# Patient Record
Sex: Female | Born: 1940 | Race: Black or African American | Hispanic: No | Marital: Married | Smoking: Never smoker
Health system: Southern US, Community
[De-identification: ages and names within clinical notes are randomized; demographics above are authoritative.]

## PROBLEM LIST (undated history)

## (undated) DIAGNOSIS — C773 Secondary and unspecified malignant neoplasm of axilla and upper limb lymph nodes: Secondary | ICD-10-CM

## (undated) DIAGNOSIS — C884 Extranodal marginal zone b-cell lymphoma of mucosa-associated lymphoid tissue (malt-lymphoma) not having achieved remission: Secondary | ICD-10-CM

## (undated) DIAGNOSIS — E785 Hyperlipidemia, unspecified: Secondary | ICD-10-CM

## (undated) DIAGNOSIS — C50919 Malignant neoplasm of unspecified site of unspecified female breast: Secondary | ICD-10-CM

## (undated) DIAGNOSIS — D649 Anemia, unspecified: Secondary | ICD-10-CM

## (undated) DIAGNOSIS — Z9221 Personal history of antineoplastic chemotherapy: Secondary | ICD-10-CM

## (undated) DIAGNOSIS — M810 Age-related osteoporosis without current pathological fracture: Secondary | ICD-10-CM

## (undated) DIAGNOSIS — I10 Essential (primary) hypertension: Secondary | ICD-10-CM

## (undated) DIAGNOSIS — R011 Cardiac murmur, unspecified: Secondary | ICD-10-CM

## (undated) DIAGNOSIS — I890 Lymphedema, not elsewhere classified: Secondary | ICD-10-CM

## (undated) DIAGNOSIS — E8989 Other postprocedural endocrine and metabolic complications and disorders: Secondary | ICD-10-CM

## (undated) HISTORY — DX: Cardiac murmur, unspecified: R01.1

## (undated) HISTORY — DX: Extranodal marginal zone b-cell lymphoma of mucosa-associated lymphoid tissue (malt-lymphoma) not having achieved remission: C88.40

## (undated) HISTORY — PX: APPENDECTOMY: SHX54

## (undated) HISTORY — DX: Hyperlipidemia, unspecified: E78.5

## (undated) HISTORY — DX: Lymphedema, not elsewhere classified: I89.0

## (undated) HISTORY — DX: Malignant neoplasm of unspecified site of unspecified female breast: C50.919

## (undated) HISTORY — PX: ABDOMINAL HYSTERECTOMY: SHX81

## (undated) HISTORY — DX: Secondary and unspecified malignant neoplasm of axilla and upper limb lymph nodes: C77.3

## (undated) HISTORY — DX: Other postprocedural endocrine and metabolic complications and disorders: E89.89

## (undated) HISTORY — DX: Extranodal marginal zone B-cell lymphoma of mucosa-associated lymphoid tissue (MALT-lymphoma): C88.4

## (undated) HISTORY — DX: Essential (primary) hypertension: I10

## (undated) HISTORY — DX: Age-related osteoporosis without current pathological fracture: M81.0

---

## 2009-05-22 ENCOUNTER — Emergency Department: Payer: Self-pay | Admitting: Unknown Physician Specialty

## 2011-08-03 ENCOUNTER — Ambulatory Visit: Payer: Self-pay | Admitting: Internal Medicine

## 2013-02-13 DIAGNOSIS — C50919 Malignant neoplasm of unspecified site of unspecified female breast: Secondary | ICD-10-CM

## 2013-02-13 HISTORY — DX: Malignant neoplasm of unspecified site of unspecified female breast: C50.919

## 2013-09-13 DIAGNOSIS — C50919 Malignant neoplasm of unspecified site of unspecified female breast: Secondary | ICD-10-CM

## 2013-09-13 DIAGNOSIS — E8989 Other postprocedural endocrine and metabolic complications and disorders: Secondary | ICD-10-CM

## 2013-09-13 HISTORY — DX: Malignant neoplasm of unspecified site of unspecified female breast: C50.919

## 2013-09-13 HISTORY — DX: Lymphedema, not elsewhere classified: E89.89

## 2013-10-05 ENCOUNTER — Emergency Department: Payer: Self-pay | Admitting: Emergency Medicine

## 2013-10-06 ENCOUNTER — Ambulatory Visit: Payer: Self-pay | Admitting: Internal Medicine

## 2013-10-08 ENCOUNTER — Other Ambulatory Visit: Payer: Medicare Other

## 2013-10-08 ENCOUNTER — Ambulatory Visit (INDEPENDENT_AMBULATORY_CARE_PROVIDER_SITE_OTHER): Payer: Medicare Other | Admitting: General Surgery

## 2013-10-08 ENCOUNTER — Other Ambulatory Visit: Payer: Self-pay | Admitting: General Surgery

## 2013-10-08 ENCOUNTER — Encounter: Payer: Self-pay | Admitting: General Surgery

## 2013-10-08 VITALS — BP 190/100 | HR 86 | Resp 16 | Ht 60.0 in | Wt 129.0 lb

## 2013-10-08 DIAGNOSIS — C50919 Malignant neoplasm of unspecified site of unspecified female breast: Secondary | ICD-10-CM

## 2013-10-08 DIAGNOSIS — C50912 Malignant neoplasm of unspecified site of left female breast: Secondary | ICD-10-CM

## 2013-10-08 DIAGNOSIS — N63 Unspecified lump in unspecified breast: Secondary | ICD-10-CM

## 2013-10-08 NOTE — Progress Notes (Signed)
APatient ID: Andrea Rodgers, female   DOB: 1941/02/10, 73 y.o.   MRN: [id number]  Chief Complaint  Patient presents with  . Other    mammogram    HPI Andrea Rodgers is a 73 y.o. female who presents for a breast evaluation. The most recent mammogram was done on 10-06-13 with ultrasound.  Patient does perform regular self breast checks "every so often" but does not get regular mammograms done.  Her previous mammogram was at least 10 years ago.  Denies family history of breast cancer. States she found the lump in the left breast about 2-3 weeks ago. It has not changed in size. She has lost 10 pounds since last year, but apart attributes this to dietary modification. Denies any breast trauma.  Her initial exam was completed without family, and after the exam and procedure her husband, daughter and son were addressed.   HPI  Past Medical History  Diagnosis Date  . Hypertension   . Hyperlipidemia   . Murmur     Past Surgical History  Procedure Laterality Date  . Abdominal hysterectomy    . Appendectomy      No family history on file.  Social History History  Substance Use Topics  . Smoking status: Never Smoker   . Smokeless tobacco: Never Used  . Alcohol Use: No    No Known Allergies  Current Outpatient Prescriptions  Medication Sig Dispense Refill  . alendronate (FOSAMAX) 70 MG tablet Take 70 mg by mouth once a week. Take with a full glass of water on an empty stomach.      Marland Kitchen amLODipine (NORVASC) 2.5 MG tablet Take 2.5 mg by mouth daily.      Marland Kitchen aspirin 81 MG tablet Take 81 mg by mouth daily.      Marland Kitchen losartan-hydrochlorothiazide (HYZAAR) 100-25 MG per tablet Take 1 tablet by mouth daily.       No current facility-administered medications for this visit.    Review of Systems Review of Systems  Constitutional: Negative.   Respiratory: Negative.   Cardiovascular: Negative.     Blood pressure 190/100, pulse 86, resp. rate 16, height 5' (1.524 m), weight 129 lb (58.514  kg).  Physical Exam Physical Exam  Constitutional: She is oriented to person, place, and time. She appears well-developed and well-nourished.  Neck: Neck supple.  Cardiovascular: Normal rate and regular rhythm.   Murmur heard.  Systolic murmur is present with a grade of 2/6  Pulmonary/Chest: Effort normal and breath sounds normal. Right breast exhibits no inverted nipple, no mass, no nipple discharge, no skin change and no tenderness. Left breast exhibits mass and skin change. Left breast exhibits no inverted nipple, no nipple discharge and no tenderness.  Dimpling left lower breast. Left breast 4 o'clock  3 x 4 cm mass with fixation of skin.  Lymphadenopathy:    She has no cervical adenopathy.    She has axillary adenopathy.  Left axilla lymph node.  Neurological: She is alert and oriented to person, place, and time.  Skin: Skin is warm and dry.    Data Reviewed Bilateral mammograms dated October 06, 2013 showed 8 dominant mass in the left breast. Ultrasound showed a spiculated mass measuring up to 3.7 cm. An enlarged axillary node was also identified. [id number]-5.  The patient was amenable to biopsy. The procedure was reviewed. A total of 10 cc of 0.5% Xylocaine with 0.25% Marcaine with 1-200,000 epinephrine was utilized well tolerated. The axillary area was approached first where 80s 1.0  x 1.2 x 2.5 cm enlarged axillary lymph node with loss of normal architecture was identified. A 14-gauge Tru-Cut device was used and a single core sample obtained through the markedly abnormal central portion of the note. This was sent in formalin for routine histology. Scant bleeding was noted. A postbiopsy clip was placed.  Attention was turned to the breast where a 2.7 x 2.8 x 3.2 cm multilobulated irregular hypoechoic mass with focal acoustic shadowing was identified. Once again a 14-gauge Bard biopsy device was utilized and multiple core samples obtained. A postbiopsy clip was placed. Both skin defects  were closed with benzoin and Steri-Strips followed by Telfa and Tegaderm dressing. The procedures were well-tolerated.   Assessment    Left breast cancer.     Plan    The clinical impression of a stage II left breast cancer was discussed with the patient and her family. The advisability of early medical oncology consultation and the probability of neoadjuvant chemotherapy were reviewed. The advisability of power port placement to facilitate chemotherapy was discussed. The risks associated with port placement including pulmonary and vascular injuries were reviewed.  Arrangements will be made for medical oncology assessment early next week when her laboratory and pathology results are available.Based on their recommendation power port placement will take place afterward.  Patient will be sent to [facility] today to have the following drawn: CBC, Met C, CEA, and CA 27.29.      PCP/Ref: Irven Easterly 10/08/2013, 8:44 PM

## 2013-10-08 NOTE — Patient Instructions (Addendum)
CARE AFTER BREAST BIOPSY  1. Leave the dressing on that your doctor applied after surgery. It is waterproof. You may bathe, shower and/or swim. The dressing will probably remain intact until your return office visit. If the dressing comes off, you will see small strips of tape against your skin on the incision. Do not remove these strips.  2. You may want to use a gauze,cloth or similar protection in your bra to prevent rubbing against your dressing and incision. This is not necessary, but you may feel more comfortable doing so.  3. It is recommended that you wear a bra day and night to give support to the breast. This will prevent the weight of the breast from pulling on the incision.  4. Your breast will feel hard and lumpy under the incision. Do not be alarmed. This is the underlying stitching of tissue. Softening of this tissue will occur in time.  5. Make sure you call the office and schedule an appointment in one week after your surgery. The office phone number is [phone]. [facility] may have already done this for you.  6. You will notice about a week after your office visit that the strips of the tape on your incision will begin to loosen. These may then be removed.  7. Report to your doctor any of the following:  * Severe pain not relieved by your pain medication  *Redness of the incision  * Drainage from the incision  *Fever greater than 101 degrees  Patient will be sent to [facility] today.

## 2013-10-09 ENCOUNTER — Telehealth: Payer: Self-pay | Admitting: *Deleted

## 2013-10-09 LAB — COMPREHENSIVE METABOLIC PANEL
A/G RATIO: 1.3 (ref 1.1–2.5)
ALT: 9 IU/L (ref 0–32)
AST: 16 IU/L (ref 0–40)
Albumin: 4.5 g/dL (ref 3.5–4.8)
Alkaline Phosphatase: 76 IU/L (ref 39–117)
BUN/Creatinine Ratio: 15 (ref 11–26)
BUN: 10 mg/dL (ref 8–27)
CALCIUM: 9.4 mg/dL (ref 8.7–10.3)
CO2: 27 mmol/L (ref 18–29)
Chloride: 98 mmol/L (ref 97–108)
Creatinine, Ser: 0.66 mg/dL (ref 0.57–1.00)
GFR, EST AFRICAN AMERICAN: 100 mL/min/{1.73_m2} (ref >59)
GFR, EST NON AFRICAN AMERICAN: 87 mL/min/{1.73_m2} (ref >59)
GLOBULIN, TOTAL: 3.4 g/dL (ref 1.5–4.5)
GLUCOSE: 110 mg/dL — AB (ref 65–99)
POTASSIUM: 3.7 mmol/L (ref 3.5–5.2)
SODIUM: 142 mmol/L (ref 134–144)
TOTAL PROTEIN: 7.9 g/dL (ref 6.0–8.5)
Total Bilirubin: 0.3 mg/dL (ref 0.0–1.2)

## 2013-10-09 LAB — CBC WITH DIFFERENTIAL
BASOS ABS: 0 10*3/uL (ref 0.0–0.2)
Basos: 0 %
EOS ABS: 0 10*3/uL (ref 0.0–0.4)
Eos: 0 %
HCT: 38.6 % (ref 34.0–46.6)
HEMOGLOBIN: 13.3 g/dL (ref 11.1–15.9)
Immature Grans (Abs): 0 10*3/uL (ref 0.0–0.1)
Immature Granulocytes: 0 %
LYMPHS ABS: 2.3 10*3/uL (ref 0.7–3.1)
Lymphs: 42 %
MCH: 30.6 pg (ref 26.6–33.0)
MCHC: 34.5 g/dL (ref 31.5–35.7)
MCV: 89 fL (ref 79–97)
Monocytes Absolute: 0.7 10*3/uL (ref 0.1–0.9)
Monocytes: 13 %
Neutrophils Absolute: 2.4 10*3/uL (ref 1.4–7.0)
Neutrophils Relative %: 45 %
Platelets: 329 10*3/uL (ref 150–379)
RBC: 4.35 x10E6/uL (ref 3.77–5.28)
RDW: 13.3 % (ref 12.3–15.4)
WBC: 5.5 10*3/uL (ref 3.4–10.8)

## 2013-10-09 LAB — CEA: CEA: 1.9 ng/mL (ref 0.0–4.7)

## 2013-10-09 LAB — CANCER ANTIGEN 27.29: CA 27.29: 63.6 U/mL — ABNORMAL HIGH (ref 0.0–38.6)

## 2013-10-09 NOTE — Telephone Encounter (Signed)
Message copied by Dominga Ferry on Thu Oct 09, 2013  8:53 AM ------      Message from: [place], Forest Gleason      Created: Wed Oct 08, 2013  9:34 PM       Please arrange for medical oncology assessment early next week with Dr. Ma Hillock or [facility] Re: stage II breast cancer. Send copy of office note  And laboratory studies. Thank you ------

## 2013-10-09 NOTE — Telephone Encounter (Signed)
Patient has been scheduled for an appointment with Dr. Ma Hillock at the [facility] for Tuesday, 10-14-13 at 9 am. This patient is aware of date, time, and instructions. She verbalizes understanding.   Colette at the [facility] confirmed that she received records.

## 2013-10-10 ENCOUNTER — Telehealth: Payer: Self-pay | Admitting: General Surgery

## 2013-10-10 LAB — PATHOLOGY

## 2013-10-10 NOTE — Telephone Encounter (Signed)
The patient was notified that both the breast and the lymph gland showed evidence of cancer.  She reports minimal discomfort from the procedure. Arrangements are in place for her evaluation by medical oncology on 10/14/2013.

## 2013-10-14 ENCOUNTER — Ambulatory Visit: Payer: Self-pay | Admitting: Internal Medicine

## 2013-10-14 ENCOUNTER — Other Ambulatory Visit: Payer: Self-pay | Admitting: General Surgery

## 2013-10-14 DIAGNOSIS — C50912 Malignant neoplasm of unspecified site of left female breast: Secondary | ICD-10-CM

## 2013-10-15 ENCOUNTER — Telehealth: Payer: Self-pay

## 2013-10-15 LAB — PATHOLOGY

## 2013-10-15 LAB — IMMUNOHISTOCHEMICAL STAIN(S)

## 2013-10-15 NOTE — Telephone Encounter (Signed)
I spoke to Andrea Rodgers, patient's daughter, about scheduling her for a port placement recommended by Dr Ma Hillock at the hospital on 10/21/13. She is in agreement for this. Patient is scheduled for a port placement at [facility] on 10/21/13. She will pre admit at the hospital on 10/16/13 at 9:00 am. Patient is aware of dates, time, and instructions.

## 2013-10-16 ENCOUNTER — Ambulatory Visit: Payer: Self-pay | Admitting: General Surgery

## 2013-10-16 ENCOUNTER — Ambulatory Visit: Payer: Self-pay | Admitting: Internal Medicine

## 2013-10-21 ENCOUNTER — Ambulatory Visit: Payer: Self-pay | Admitting: General Surgery

## 2013-10-21 ENCOUNTER — Encounter: Payer: Self-pay | Admitting: General Surgery

## 2013-10-21 DIAGNOSIS — C50919 Malignant neoplasm of unspecified site of unspecified female breast: Secondary | ICD-10-CM

## 2013-10-22 ENCOUNTER — Encounter: Payer: Self-pay | Admitting: General Surgery

## 2013-10-23 ENCOUNTER — Ambulatory Visit: Payer: Self-pay | Admitting: Internal Medicine

## 2013-10-30 ENCOUNTER — Ambulatory Visit: Payer: Self-pay | Admitting: Internal Medicine

## 2013-10-31 ENCOUNTER — Emergency Department: Payer: Self-pay | Admitting: Internal Medicine

## 2013-10-31 LAB — CBC
HCT: 34.7 % — AB (ref 35.0–47.0)
HGB: 11.2 g/dL — ABNORMAL LOW (ref 12.0–16.0)
MCH: 29.7 pg (ref 26.0–34.0)
MCHC: 32.1 g/dL (ref 32.0–36.0)
MCV: 93 fL (ref 80–100)
PLATELETS: 241 10*3/uL (ref 150–440)
RBC: 3.75 10*6/uL — AB (ref 3.80–5.20)
RDW: 13.6 % (ref 11.5–14.5)
WBC: 8.8 10*3/uL (ref 3.6–11.0)

## 2013-10-31 LAB — TROPONIN I: Troponin-I: <0.02 ng/mL

## 2013-10-31 LAB — COMPREHENSIVE METABOLIC PANEL
ALK PHOS: 65 U/L
ALT: 14 U/L
ANION GAP: 8 (ref 7–16)
Albumin: 3.3 g/dL — ABNORMAL LOW (ref 3.4–5.0)
BUN: 11 mg/dL (ref 7–18)
Bilirubin,Total: 0.5 mg/dL (ref 0.2–1.0)
CALCIUM: 8.6 mg/dL (ref 8.5–10.1)
Chloride: 105 mmol/L (ref 98–107)
Co2: 29 mmol/L (ref 21–32)
Creatinine: 0.67 mg/dL (ref 0.60–1.30)
EGFR (African American): >60
EGFR (Non-African Amer.): >60
GLUCOSE: 101 mg/dL — AB (ref 65–99)
Osmolality: 283 (ref 275–301)
Potassium: 3.5 mmol/L (ref 3.5–5.1)
SGOT(AST): 13 U/L — ABNORMAL LOW (ref 15–37)
Sodium: 142 mmol/L (ref 136–145)
TOTAL PROTEIN: 8 g/dL (ref 6.4–8.2)

## 2013-11-01 LAB — BRONCHIAL WASH CULTURE

## 2013-11-10 LAB — COMPREHENSIVE METABOLIC PANEL
ANION GAP: 6 — AB (ref 7–16)
AST: 14 U/L — AB (ref 15–37)
Albumin: 3.3 g/dL — ABNORMAL LOW (ref 3.4–5.0)
Alkaline Phosphatase: 73 U/L
BILIRUBIN TOTAL: 0.3 mg/dL (ref 0.2–1.0)
BUN: 10 mg/dL (ref 7–18)
CALCIUM: 9.1 mg/dL (ref 8.5–10.1)
Chloride: 100 mmol/L (ref 98–107)
Co2: 32 mmol/L (ref 21–32)
Creatinine: 0.85 mg/dL (ref 0.60–1.30)
EGFR (African American): >60
Glucose: 107 mg/dL — ABNORMAL HIGH (ref 65–99)
OSMOLALITY: 275 (ref 275–301)
Potassium: 2.9 mmol/L — ABNORMAL LOW (ref 3.5–5.1)
SGPT (ALT): 19 U/L
Sodium: 138 mmol/L (ref 136–145)
Total Protein: 7.3 g/dL (ref 6.4–8.2)

## 2013-11-10 LAB — CBC CANCER CENTER
BASOS ABS: 0 x10 3/mm (ref 0.0–0.1)
Basophil %: 0.4 %
Eosinophil #: 0 x10 3/mm (ref 0.0–0.7)
Eosinophil %: 0.8 %
HCT: 35.7 % (ref 35.0–47.0)
HGB: 11.6 g/dL — ABNORMAL LOW (ref 12.0–16.0)
LYMPHS ABS: 1.8 x10 3/mm (ref 1.0–3.6)
Lymphocyte %: 35.1 %
MCH: 29.8 pg (ref 26.0–34.0)
MCHC: 32.6 g/dL (ref 32.0–36.0)
MCV: 92 fL (ref 80–100)
MONOS PCT: 16.4 %
Monocyte #: 0.8 x10 3/mm (ref 0.2–0.9)
Neutrophil #: 2.4 x10 3/mm (ref 1.4–6.5)
Neutrophil %: 47.3 %
Platelet: 326 x10 3/mm (ref 150–440)
RBC: 3.9 10*6/uL (ref 3.80–5.20)
RDW: 13 % (ref 11.5–14.5)
WBC: 5.2 x10 3/mm (ref 3.6–11.0)

## 2013-11-13 ENCOUNTER — Ambulatory Visit: Payer: Self-pay | Admitting: Internal Medicine

## 2013-11-18 LAB — CBC CANCER CENTER
Bands: 9 %
HCT: 32.9 % — ABNORMAL LOW (ref 35.0–47.0)
HGB: 10.9 g/dL — ABNORMAL LOW (ref 12.0–16.0)
LYMPHS PCT: 49 %
MCH: 30.5 pg (ref 26.0–34.0)
MCHC: 33.1 g/dL (ref 32.0–36.0)
MCV: 92 fL (ref 80–100)
METAMYELOCYTE: 2 %
Monocytes: 9 %
PLATELETS: 170 x10 3/mm (ref 150–440)
RBC: 3.56 10*6/uL — ABNORMAL LOW (ref 3.80–5.20)
RDW: 13.7 % (ref 11.5–14.5)
Segmented Neutrophils: 31 %
WBC: 3.6 x10 3/mm (ref 3.6–11.0)

## 2013-11-20 LAB — CULTURE, FUNGUS WITHOUT SMEAR

## 2013-11-24 LAB — CREATININE, SERUM
Creatinine: 0.78 mg/dL (ref 0.60–1.30)
EGFR (African American): >60
EGFR (Non-African Amer.): >60

## 2013-11-24 LAB — CBC CANCER CENTER
Basophil #: 0 x10 3/mm (ref 0.0–0.1)
Basophil %: 0.2 %
Eosinophil #: 0 x10 3/mm (ref 0.0–0.7)
Eosinophil %: 0.1 %
HCT: 34.9 % — ABNORMAL LOW (ref 35.0–47.0)
HGB: 11.3 g/dL — AB (ref 12.0–16.0)
Lymphocyte #: 2.1 x10 3/mm (ref 1.0–3.6)
Lymphocyte %: 16.3 %
MCH: 30 pg (ref 26.0–34.0)
MCHC: 32.4 g/dL (ref 32.0–36.0)
MCV: 93 fL (ref 80–100)
MONO ABS: 1.2 x10 3/mm — AB (ref 0.2–0.9)
MONOS PCT: 9.5 %
NEUTROS PCT: 73.9 %
Neutrophil #: 9.7 x10 3/mm — ABNORMAL HIGH (ref 1.4–6.5)
Platelet: 165 x10 3/mm (ref 150–440)
RBC: 3.77 10*6/uL — ABNORMAL LOW (ref 3.80–5.20)
RDW: 13.9 % (ref 11.5–14.5)
WBC: 13.1 x10 3/mm — ABNORMAL HIGH (ref 3.6–11.0)

## 2013-11-24 LAB — HEPATIC FUNCTION PANEL A (ARMC)
ALK PHOS: 106 U/L
Albumin: 3.3 g/dL — ABNORMAL LOW (ref 3.4–5.0)
BILIRUBIN TOTAL: 0.1 mg/dL — AB (ref 0.2–1.0)
Bilirubin, Direct: <0.05 mg/dL (ref 0.00–0.20)
SGOT(AST): 16 U/L (ref 15–37)
SGPT (ALT): 19 U/L
TOTAL PROTEIN: 6.9 g/dL (ref 6.4–8.2)

## 2013-12-01 LAB — CBC CANCER CENTER
BASOS ABS: 0 x10 3/mm (ref 0.0–0.1)
BASOS PCT: 0.8 %
EOS ABS: 0 x10 3/mm (ref 0.0–0.7)
Eosinophil %: 0 %
HCT: 34.1 % — AB (ref 35.0–47.0)
HGB: 11.1 g/dL — ABNORMAL LOW (ref 12.0–16.0)
Lymphocyte #: 1.9 x10 3/mm (ref 1.0–3.6)
Lymphocyte %: 38.2 %
MCH: 30 pg (ref 26.0–34.0)
MCHC: 32.7 g/dL (ref 32.0–36.0)
MCV: 92 fL (ref 80–100)
Monocyte #: 1 x10 3/mm — ABNORMAL HIGH (ref 0.2–0.9)
Monocyte %: 20.1 %
NEUTROS PCT: 40.9 %
Neutrophil #: 2 x10 3/mm (ref 1.4–6.5)
PLATELETS: 280 x10 3/mm (ref 150–440)
RBC: 3.71 10*6/uL — AB (ref 3.80–5.20)
RDW: 14.3 % (ref 11.5–14.5)
WBC: 5 x10 3/mm (ref 3.6–11.0)

## 2013-12-01 LAB — BASIC METABOLIC PANEL
Anion Gap: 6 — ABNORMAL LOW (ref 7–16)
BUN: 13 mg/dL (ref 7–18)
CREATININE: 0.63 mg/dL (ref 0.60–1.30)
Calcium, Total: 8.7 mg/dL (ref 8.5–10.1)
Chloride: 103 mmol/L (ref 98–107)
Co2: 32 mmol/L (ref 21–32)
EGFR (African American): >60
Glucose: 97 mg/dL (ref 65–99)
OSMOLALITY: 281 (ref 275–301)
Potassium: 3.3 mmol/L — ABNORMAL LOW (ref 3.5–5.1)
SODIUM: 141 mmol/L (ref 136–145)

## 2013-12-08 DIAGNOSIS — B379 Candidiasis, unspecified: Secondary | ICD-10-CM | POA: Insufficient documentation

## 2013-12-08 LAB — CBC CANCER CENTER
BASOS PCT: 0.5 %
Basophil #: 0 x10 3/mm (ref 0.0–0.1)
Eosinophil #: 0 x10 3/mm (ref 0.0–0.7)
Eosinophil %: 0.3 %
HCT: 31.6 % — ABNORMAL LOW (ref 35.0–47.0)
HGB: 10.3 g/dL — AB (ref 12.0–16.0)
Lymphocyte #: 1 x10 3/mm (ref 1.0–3.6)
Lymphocyte %: 17.2 %
MCH: 30.5 pg (ref 26.0–34.0)
MCHC: 32.7 g/dL (ref 32.0–36.0)
MCV: 93 fL (ref 80–100)
MONOS PCT: 6.7 %
Monocyte #: 0.4 x10 3/mm (ref 0.2–0.9)
NEUTROS PCT: 75.3 %
Neutrophil #: 4.2 x10 3/mm (ref 1.4–6.5)
Platelet: 193 x10 3/mm (ref 150–440)
RBC: 3.39 10*6/uL — ABNORMAL LOW (ref 3.80–5.20)
RDW: 14.9 % — ABNORMAL HIGH (ref 11.5–14.5)
WBC: 5.6 x10 3/mm (ref 3.6–11.0)

## 2013-12-14 ENCOUNTER — Ambulatory Visit: Payer: Self-pay | Admitting: Internal Medicine

## 2013-12-15 ENCOUNTER — Encounter: Payer: Self-pay | Admitting: General Surgery

## 2013-12-15 LAB — CBC CANCER CENTER
Bands: 8 %
Basophil: 1 %
HCT: 32.3 % — ABNORMAL LOW (ref 35.0–47.0)
HGB: 10.4 g/dL — ABNORMAL LOW (ref 12.0–16.0)
Lymphocytes: 9 %
MCH: 30.5 pg (ref 26.0–34.0)
MCHC: 32.3 g/dL (ref 32.0–36.0)
MCV: 94 fL (ref 80–100)
Metamyelocyte: 4 %
Monocytes: 7 %
NRBC/100 WBC: 5 /100
Platelet: 164 x10 3/mm (ref 150–440)
RBC: 3.42 10*6/uL — AB (ref 3.80–5.20)
RDW: 16.2 % — ABNORMAL HIGH (ref 11.5–14.5)
SEGMENTED NEUTROPHILS: 71 %
WBC: 24.5 x10 3/mm — ABNORMAL HIGH (ref 3.6–11.0)

## 2013-12-15 LAB — CREATININE, SERUM
Creatinine: 0.62 mg/dL (ref 0.60–1.30)
EGFR (African American): >60
EGFR (Non-African Amer.): >60

## 2013-12-15 LAB — HEPATIC FUNCTION PANEL A (ARMC)
Albumin: 3.7 g/dL (ref 3.4–5.0)
Alkaline Phosphatase: 133 U/L — ABNORMAL HIGH
Bilirubin, Direct: <0.1 mg/dL (ref 0.0–0.2)
Bilirubin,Total: 0.2 mg/dL (ref 0.2–1.0)
SGOT(AST): 24 U/L (ref 15–37)
SGPT (ALT): 24 U/L
Total Protein: 7.4 g/dL (ref 6.4–8.2)

## 2013-12-22 LAB — BASIC METABOLIC PANEL
Anion Gap: 8 (ref 7–16)
BUN: 10 mg/dL (ref 7–18)
CALCIUM: 8.7 mg/dL (ref 8.5–10.1)
Chloride: 105 mmol/L (ref 98–107)
Co2: 31 mmol/L (ref 21–32)
Creatinine: 0.69 mg/dL (ref 0.60–1.30)
GLUCOSE: 101 mg/dL — AB (ref 65–99)
Osmolality: 286 (ref 275–301)
POTASSIUM: 3.6 mmol/L (ref 3.5–5.1)
SODIUM: 144 mmol/L (ref 136–145)

## 2013-12-22 LAB — CBC CANCER CENTER
BASOS ABS: 0 x10 3/mm (ref 0.0–0.1)
Basophil %: 0.5 %
EOS ABS: 0 x10 3/mm (ref 0.0–0.7)
Eosinophil %: 0.3 %
HCT: 33 % — AB (ref 35.0–47.0)
HGB: 10.8 g/dL — ABNORMAL LOW (ref 12.0–16.0)
LYMPHS ABS: 1.6 x10 3/mm (ref 1.0–3.6)
Lymphocyte %: 27.3 %
MCH: 31.1 pg (ref 26.0–34.0)
MCHC: 32.7 g/dL (ref 32.0–36.0)
MCV: 95 fL (ref 80–100)
MONOS PCT: 19.4 %
Monocyte #: 1.1 x10 3/mm — ABNORMAL HIGH (ref 0.2–0.9)
Neutrophil #: 3 x10 3/mm (ref 1.4–6.5)
Neutrophil %: 52.5 %
Platelet: 209 x10 3/mm (ref 150–440)
RBC: 3.48 10*6/uL — AB (ref 3.80–5.20)
RDW: 17.2 % — AB (ref 11.5–14.5)
WBC: 5.7 x10 3/mm (ref 3.6–11.0)

## 2013-12-29 LAB — CBC CANCER CENTER
Basophil #: 0 x10 3/mm (ref 0.0–0.1)
Basophil %: 0.6 %
EOS PCT: 0.7 %
Eosinophil #: 0 x10 3/mm (ref 0.0–0.7)
HCT: 29.9 % — AB (ref 35.0–47.0)
HGB: 10.1 g/dL — AB (ref 12.0–16.0)
LYMPHS ABS: 0.9 x10 3/mm — AB (ref 1.0–3.6)
LYMPHS PCT: 24.6 %
MCH: 32 pg (ref 26.0–34.0)
MCHC: 33.8 g/dL (ref 32.0–36.0)
MCV: 95 fL (ref 80–100)
Monocyte #: 0.2 x10 3/mm (ref 0.2–0.9)
Monocyte %: 5.8 %
Neutrophil #: 2.6 x10 3/mm (ref 1.4–6.5)
Neutrophil %: 68.3 %
Platelet: 141 x10 3/mm — ABNORMAL LOW (ref 150–440)
RBC: 3.16 10*6/uL — AB (ref 3.80–5.20)
RDW: 17.7 % — AB (ref 11.5–14.5)
WBC: 3.8 x10 3/mm (ref 3.6–11.0)

## 2014-01-05 LAB — CBC CANCER CENTER
BASOS ABS: 0 x10 3/mm (ref 0.0–0.1)
Basophil %: 0.4 %
EOS PCT: 0.4 %
Eosinophil #: 0 x10 3/mm (ref 0.0–0.7)
HCT: 33.6 % — AB (ref 35.0–47.0)
HGB: 10.9 g/dL — AB (ref 12.0–16.0)
LYMPHS ABS: 1.4 x10 3/mm (ref 1.0–3.6)
LYMPHS PCT: 22.4 %
MCH: 31.3 pg (ref 26.0–34.0)
MCHC: 32.4 g/dL (ref 32.0–36.0)
MCV: 97 fL (ref 80–100)
Monocyte #: 1.3 x10 3/mm — ABNORMAL HIGH (ref 0.2–0.9)
Monocyte %: 20.7 %
Neutrophil #: 3.5 x10 3/mm (ref 1.4–6.5)
Neutrophil %: 56.1 %
Platelet: 234 x10 3/mm (ref 150–440)
RBC: 3.48 10*6/uL — ABNORMAL LOW (ref 3.80–5.20)
RDW: 18.5 % — AB (ref 11.5–14.5)
WBC: 6.3 x10 3/mm (ref 3.6–11.0)

## 2014-01-05 LAB — HEPATIC FUNCTION PANEL A (ARMC)
ALK PHOS: 94 U/L
ALT: 19 U/L
Albumin: 3.5 g/dL (ref 3.4–5.0)
BILIRUBIN TOTAL: 0.2 mg/dL (ref 0.2–1.0)
Bilirubin, Direct: <0.05 mg/dL (ref 0.0–0.2)
SGOT(AST): 10 U/L — ABNORMAL LOW (ref 15–37)
Total Protein: 7 g/dL (ref 6.4–8.2)

## 2014-01-05 LAB — CREATININE, SERUM
CREATININE: 0.61 mg/dL (ref 0.60–1.30)
EGFR (African American): >60
EGFR (Non-African Amer.): >60

## 2014-01-12 LAB — CBC CANCER CENTER
BASOS ABS: 0 x10 3/mm (ref 0.0–0.1)
BASOS PCT: 0.5 %
EOS PCT: 0.5 %
Eosinophil #: 0 x10 3/mm (ref 0.0–0.7)
HCT: 32.7 % — AB (ref 35.0–47.0)
HGB: 11 g/dL — ABNORMAL LOW (ref 12.0–16.0)
LYMPHS ABS: 1.3 x10 3/mm (ref 1.0–3.6)
Lymphocyte %: 31.4 %
MCH: 31.9 pg (ref 26.0–34.0)
MCHC: 33.5 g/dL (ref 32.0–36.0)
MCV: 95 fL (ref 80–100)
Monocyte #: 1 x10 3/mm — ABNORMAL HIGH (ref 0.2–0.9)
Monocyte %: 23.6 %
Neutrophil #: 1.8 x10 3/mm (ref 1.4–6.5)
Neutrophil %: 44 %
Platelet: 305 x10 3/mm (ref 150–440)
RBC: 3.43 10*6/uL — ABNORMAL LOW (ref 3.80–5.20)
RDW: 18.1 % — ABNORMAL HIGH (ref 11.5–14.5)
WBC: 4.1 x10 3/mm (ref 3.6–11.0)

## 2014-01-12 LAB — HEPATIC FUNCTION PANEL A (ARMC)
ALBUMIN: 3.5 g/dL (ref 3.4–5.0)
AST: 14 U/L — AB (ref 15–37)
Alkaline Phosphatase: 80 U/L
BILIRUBIN TOTAL: 0.2 mg/dL (ref 0.2–1.0)
SGPT (ALT): 18 U/L
Total Protein: 7.1 g/dL (ref 6.4–8.2)

## 2014-01-12 LAB — BASIC METABOLIC PANEL
ANION GAP: 9 (ref 7–16)
BUN: 14 mg/dL (ref 7–18)
Calcium, Total: 9.1 mg/dL (ref 8.5–10.1)
Chloride: 104 mmol/L (ref 98–107)
Co2: 31 mmol/L (ref 21–32)
GLUCOSE: 93 mg/dL (ref 65–99)
OSMOLALITY: 287 (ref 275–301)
Potassium: 3.6 mmol/L (ref 3.5–5.1)
Sodium: 144 mmol/L (ref 136–145)

## 2014-01-12 LAB — CREATININE, SERUM
Creatinine: 0.66 mg/dL (ref 0.60–1.30)
EGFR (African American): >60

## 2014-01-13 ENCOUNTER — Ambulatory Visit: Payer: Self-pay | Admitting: Internal Medicine

## 2014-01-20 LAB — CBC CANCER CENTER
BASOS ABS: 0 x10 3/mm (ref 0.0–0.1)
Basophil %: 0.6 %
EOS ABS: 0 x10 3/mm (ref 0.0–0.7)
EOS PCT: 0.3 %
HCT: 29.1 % — ABNORMAL LOW (ref 35.0–47.0)
HGB: 9.8 g/dL — ABNORMAL LOW (ref 12.0–16.0)
LYMPHS ABS: 0.9 x10 3/mm — AB (ref 1.0–3.6)
Lymphocyte %: 21.5 %
MCH: 31.9 pg (ref 26.0–34.0)
MCHC: 33.5 g/dL (ref 32.0–36.0)
MCV: 95 fL (ref 80–100)
MONOS PCT: 9.6 %
Monocyte #: 0.4 x10 3/mm (ref 0.2–0.9)
NEUTROS ABS: 2.8 x10 3/mm (ref 1.4–6.5)
NEUTROS PCT: 68 %
Platelet: 122 x10 3/mm — ABNORMAL LOW (ref 150–440)
RBC: 3.06 10*6/uL — ABNORMAL LOW (ref 3.80–5.20)
RDW: 18 % — ABNORMAL HIGH (ref 11.5–14.5)
WBC: 4.1 x10 3/mm (ref 3.6–11.0)

## 2014-01-20 LAB — CREATININE, SERUM
Creatinine: 0.64 mg/dL (ref 0.60–1.30)
EGFR (African American): >60

## 2014-01-26 ENCOUNTER — Telehealth: Payer: Self-pay | Admitting: *Deleted

## 2014-01-26 LAB — HEPATIC FUNCTION PANEL A (ARMC)
ALBUMIN: 3.6 g/dL (ref 3.4–5.0)
ALK PHOS: 93 U/L
Bilirubin, Direct: <0.1 mg/dL (ref 0.0–0.2)
Bilirubin,Total: 0.2 mg/dL (ref 0.2–1.0)
SGOT(AST): 9 U/L — ABNORMAL LOW (ref 15–37)
SGPT (ALT): 16 U/L
Total Protein: 7 g/dL (ref 6.4–8.2)

## 2014-01-26 LAB — BASIC METABOLIC PANEL
Anion Gap: 6 — ABNORMAL LOW (ref 7–16)
BUN: 9 mg/dL (ref 7–18)
CHLORIDE: 104 mmol/L (ref 98–107)
CO2: 32 mmol/L (ref 21–32)
CREATININE: 0.77 mg/dL (ref 0.60–1.30)
Calcium, Total: 8.8 mg/dL (ref 8.5–10.1)
EGFR (African American): >60
EGFR (Non-African Amer.): >60
Glucose: 90 mg/dL (ref 65–99)
Osmolality: 281 (ref 275–301)
Potassium: 3.7 mmol/L (ref 3.5–5.1)
Sodium: 142 mmol/L (ref 136–145)

## 2014-01-26 LAB — CBC CANCER CENTER
Basophil #: 0 x10 3/mm (ref 0.0–0.1)
Basophil %: 0.2 %
Eosinophil #: 0 x10 3/mm (ref 0.0–0.7)
Eosinophil %: 0.4 %
HCT: 33.3 % — ABNORMAL LOW (ref 35.0–47.0)
HGB: 10.9 g/dL — ABNORMAL LOW (ref 12.0–16.0)
LYMPHS PCT: 15.1 %
Lymphocyte #: 1.2 x10 3/mm (ref 1.0–3.6)
MCH: 32.3 pg (ref 26.0–34.0)
MCHC: 32.6 g/dL (ref 32.0–36.0)
MCV: 99 fL (ref 80–100)
Monocyte #: 1 x10 3/mm — ABNORMAL HIGH (ref 0.2–0.9)
Monocyte %: 13 %
Neutrophil #: 5.7 x10 3/mm (ref 1.4–6.5)
Neutrophil %: 71.3 %
Platelet: 223 x10 3/mm (ref 150–440)
RBC: 3.36 10*6/uL — AB (ref 3.80–5.20)
RDW: 18.9 % — ABNORMAL HIGH (ref 11.5–14.5)
WBC: 8 x10 3/mm (ref 3.6–11.0)

## 2014-01-26 NOTE — Telephone Encounter (Signed)
-----   Message from [name], MD sent at 01/23/2014  2:02 PM EST ----- Please arrange an office visit and OR for this patient. Scheduling sheet is in the out box. Surgery needs to be after December 20 due to her recent chemotherapy. Her daughter may be the appropriate contact person. Thank you ----- Message -----    From: Darrin Nipper, CMA    Sent: 01/22/2014  11:19 AM      To: [name], MD  Judeen Hammans from [place] office called and stated that on the above pt had a mammo and u/s after treatment and the mass in her breast did not shrink, so they want to proceed to do a mastectomy. Judeen Hammans said to call her at [phone] or [phone] if you call on 01/23/14 but if you call today she is at the [place] office which is [phone]

## 2014-01-26 NOTE — Telephone Encounter (Signed)
Message left for patient's daughter, Judianne Seiple, to call the office.   We need to arrange a surgery date.

## 2014-01-28 NOTE — Telephone Encounter (Signed)
Patient's daughter called the office back yesterday and spoke with Caryl-Lyn.   Patient's surgery has been scheduled for 02-25-14 at [facility]. This patient will come in for a pre-op visit on 02-16-14 as scheduled.

## 2014-02-13 ENCOUNTER — Ambulatory Visit: Payer: Self-pay | Admitting: Internal Medicine

## 2014-02-16 ENCOUNTER — Encounter: Payer: Self-pay | Admitting: General Surgery

## 2014-02-16 ENCOUNTER — Other Ambulatory Visit: Payer: Self-pay | Admitting: General Surgery

## 2014-02-16 ENCOUNTER — Ambulatory Visit (INDEPENDENT_AMBULATORY_CARE_PROVIDER_SITE_OTHER): Payer: Medicare Other | Admitting: General Surgery

## 2014-02-16 VITALS — BP 156/74 | HR 76 | Resp 14 | Ht 60.0 in | Wt 132.0 lb

## 2014-02-16 DIAGNOSIS — C50912 Malignant neoplasm of unspecified site of left female breast: Secondary | ICD-10-CM

## 2014-02-16 DIAGNOSIS — C884 Extranodal marginal zone B-cell lymphoma of mucosa-associated lymphoid tissue [MALT-lymphoma]: Secondary | ICD-10-CM

## 2014-02-16 NOTE — Patient Instructions (Signed)
Left mastectomy scheduled for surgery on 02/25/14

## 2014-02-16 NOTE — Progress Notes (Addendum)
Patient ID: Andrea Rodgers, female   DOB: 24-Oct-1940, 74 y.o.   MRN: [id number]  Chief Complaint  Patient presents with  . Pre-op Exam    left mastectomy    HPI Andrea Rodgers is a 74 y.o. female here today for her pre op left mastectomy scheduled for 02/25/14.  The patient underwent 4 cycles of Adriamycin and Cytoxan. There was a less than 15% reduction in tumor volume. Her medical oncologist had recommended she proceed to surgical intervention.  The patient reports that she tolerated the therapy well.   HPI  Past Medical History  Diagnosis Date  . Hypertension   . Hyperlipidemia   . Murmur     Past Surgical History  Procedure Laterality Date  . Abdominal hysterectomy    . Appendectomy      No family history on file.  Social History History  Substance Use Topics  . Smoking status: Never Smoker   . Smokeless tobacco: Never Used  . Alcohol Use: No    No Known Allergies  Current Outpatient Prescriptions  Medication Sig Dispense Refill  . alendronate (FOSAMAX) 70 MG tablet Take 70 mg by mouth once a week. Take with a full glass of water on an empty stomach.    Marland Kitchen amLODipine (NORVASC) 2.5 MG tablet Take 2.5 mg by mouth daily.    Marland Kitchen aspirin 81 MG tablet Take 81 mg by mouth daily.    Marland Kitchen losartan-hydrochlorothiazide (HYZAAR) 100-25 MG per tablet Take 1 tablet by mouth daily.     No current facility-administered medications for this visit.    Review of Systems Review of Systems  Constitutional: Negative.   Respiratory: Negative.   Cardiovascular: Negative.     Blood pressure 156/74, pulse 76, resp. rate 14, height 5' (1.524 m), weight 132 lb (59.875 kg).  Physical Exam Physical Exam  Constitutional: She is oriented to person, place, and time. She appears well-developed and well-nourished.  Eyes: Conjunctivae are normal. No scleral icterus.  Neck: Neck supple.  Cardiovascular: Normal rate and regular rhythm.   Murmur heard.  Systolic murmur is present with a  grade of 2/6  Pulmonary/Chest: Effort normal and breath sounds normal. Right breast exhibits no inverted nipple, no mass, no nipple discharge, no skin change and no tenderness. Left breast exhibits mass. Left breast exhibits no inverted nipple, no nipple discharge, no skin change and no tenderness.    Dumpling on the left lower quadrant 5 cm mass left breast    No upper extremity asymmetry is noted.  Abdominal: Soft. Normal appearance and bowel sounds are normal. There is no tenderness.  Lymphadenopathy:    She has no cervical adenopathy.    She has no axillary adenopathy.  Neurological: She is alert and oriented to person, place, and time.  Skin: Skin is warm and dry.    Data Reviewed Medical oncology notes of 01/12/2014 were reviewed. The patient underwent a mammogram on January 21, 2014 showing minimal change.  10/16/2013 PET scan showed a 2.8 cm breast primary with 11 mm no metastases.  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 No evidence of metastatic breast cancer.  Chest CT dated 01/21/2014 showed improvement in the right lung as well as a decrease in size of the left breast mass from 3.5 x 2.6-2.8 x 2.3 cm.  Assessment    Stage II carcinoma the left breast.    Plan    Options for  management were reviewed with the patient as well as her husband, son and daughter. The potential for breast conservation if she was strongly desirous of this was discussed. She would likely be two cup sizes is smaller and would absolutely require post surgical radiation therapy. She is not so attached to the breast that she is interested in this, and was amenable to considering mastectomy.  Considering the modest response of the breast primary and previously identified core biopsy of the axilla showing metastatic disease, she will likely best be served by axillary  dissection. The risks of lymphedema were reviewed, but considering the modest response to chemotherapy I would be reluctant to leave known metastatic disease in the axilla mandating radiation. (Her primary tumor was under 5 cm and allow she has more than 4 nodes she would not be a mandatory candidate for postmastectomy radiation).  Patient scheduled for her left breast mastectomy on 02/25/14 at [facility].     PCP:  Almetta Lovely 02/16/2014, 4:49 PM

## 2014-02-17 ENCOUNTER — Other Ambulatory Visit: Payer: Self-pay

## 2014-02-17 ENCOUNTER — Telehealth: Payer: Self-pay

## 2014-02-17 DIAGNOSIS — C50912 Malignant neoplasm of unspecified site of left female breast: Secondary | ICD-10-CM

## 2014-02-17 DIAGNOSIS — C884 Extranodal marginal zone B-cell lymphoma of mucosa-associated lymphoid tissue [MALT-lymphoma]: Secondary | ICD-10-CM

## 2014-02-17 MED ORDER — TERBINAFINE HCL 1 % EX SOLN: 1.0000 | Freq: Two times a day (BID) | CUTANEOUS | Status: DC

## 2014-02-17 NOTE — Telephone Encounter (Signed)
Patient notified that her prescription has been sent in. She will pick this up today.

## 2014-02-17 NOTE — Telephone Encounter (Signed)
-----   Message from [name], MD sent at 02/16/2014  8:12 PM EST ----- Send RX for Lamisil spray, 1 %, to be used twice a day for area below both breasts. Notify patient RX sent. Thanks.

## 2014-02-25 ENCOUNTER — Ambulatory Visit: Payer: Self-pay | Admitting: General Surgery

## 2014-02-25 DIAGNOSIS — C50512 Malignant neoplasm of lower-outer quadrant of left female breast: Secondary | ICD-10-CM

## 2014-02-25 HISTORY — PX: BREAST SURGERY: SHX581

## 2014-02-25 HISTORY — PX: MASTECTOMY: SHX3

## 2014-02-26 ENCOUNTER — Encounter: Payer: Self-pay | Admitting: General Surgery

## 2014-03-02 ENCOUNTER — Ambulatory Visit (INDEPENDENT_AMBULATORY_CARE_PROVIDER_SITE_OTHER): Payer: Self-pay | Admitting: General Surgery

## 2014-03-02 ENCOUNTER — Encounter: Payer: Self-pay | Admitting: General Surgery

## 2014-03-02 VITALS — BP 154/72 | HR 70 | Resp 12 | Ht 60.0 in | Wt 130.0 lb

## 2014-03-02 DIAGNOSIS — C50912 Malignant neoplasm of unspecified site of left female breast: Secondary | ICD-10-CM

## 2014-03-02 NOTE — Patient Instructions (Addendum)
Patient to return in one week.10 days doctor.

## 2014-03-02 NOTE — Progress Notes (Signed)
Patient ID: Andrea Rodgers, female   DOB: 06-22-1940, 74 y.o.   MRN: [id number]  Chief Complaint  Patient presents with  . Routine Post Op    left mastectomy    HPI Andrea Rodgers is a 74 y.o. female. here today for her post op left breast mastectomy which was done on 02/25/14. Patient states she is doing well and drain sheets is present.HPI  Past Medical History  Diagnosis Date  . Hypertension   . Hyperlipidemia   . Murmur     Past Surgical History  Procedure Laterality Date  . Abdominal hysterectomy    . Appendectomy    . Breast surgery Left 02/25/14    Modified radical mastectomy, T2 N1. Grade 3, ER, PR negative, HER-2/neu 3+.    No family history on file.  Social History History  Substance Use Topics  . Smoking status: Never Smoker   . Smokeless tobacco: Never Used  . Alcohol Use: No    No Known Allergies  Current Outpatient Prescriptions  Medication Sig Dispense Refill  . alendronate (FOSAMAX) 70 MG tablet Take 70 mg by mouth once a week. Take with a full glass of water on an empty stomach.    Marland Kitchen amLODipine (NORVASC) 2.5 MG tablet Take 2.5 mg by mouth daily.    Marland Kitchen aspirin 81 MG tablet Take 81 mg by mouth daily.    Marland Kitchen losartan-hydrochlorothiazide (HYZAAR) 100-25 MG per tablet Take 1 tablet by mouth daily.    . Terbinafine HCl 1 % SOLN Apply 1-2 sprays topically 2 (two) times daily. Apply to area below both breasts twice daily 1 Bottle 0   No current facility-administered medications for this visit.    Review of Systems Review of Systems  Blood pressure 154/72, pulse 70, resp. rate 12, height 5' (1.524 m), weight 130 lb (58.968 kg).  Physical Exam Physical Exam  Constitutional: She is oriented to person, place, and time. She appears well-developed and well-nourished.  Eyes: Conjunctivae are normal. No scleral icterus.  Neck: Neck supple.  Cardiovascular: Normal rate, regular rhythm and normal heart sounds.   Pulmonary/Chest: Effort normal and breath sounds  normal.  Left mastectomy site looks clean and healing well.   Abdominal: Soft. Bowel sounds are normal. There is no tenderness.  Lymphadenopathy:    She has no cervical adenopathy.  Neurological: She is alert and oriented to person, place, and time.  Skin: Skin is warm.   Excellent shoulder range of motion.  Data Reviewed Pathology showed a 3.2 cm histologic grade 3 lesion with one of 11 nodes positive.  Drain record shows 40-50 mL drainage per day.  Assessment    Doing well status post modified radical mastectomy.    Plan   Patient to return in one week for staph evaluation. 10 days follow up with M.D.  PCP:  Almetta Lovely 03/03/2014, 6:58 PM

## 2014-03-03 ENCOUNTER — Encounter: Payer: Self-pay | Admitting: General Surgery

## 2014-03-09 ENCOUNTER — Ambulatory Visit (INDEPENDENT_AMBULATORY_CARE_PROVIDER_SITE_OTHER): Payer: Self-pay | Admitting: *Deleted

## 2014-03-09 DIAGNOSIS — C50912 Malignant neoplasm of unspecified site of left female breast: Secondary | ICD-10-CM

## 2014-03-09 NOTE — Progress Notes (Signed)
Patient came in today for a wound check.  The wound is clean, with no signs of infection noted. Dressing changed Follow up on 03/12/14.

## 2014-03-09 NOTE — Patient Instructions (Signed)
Patient to return as scheduled. The patient is aware to call back for any questions or concerns.  

## 2014-03-12 ENCOUNTER — Ambulatory Visit (INDEPENDENT_AMBULATORY_CARE_PROVIDER_SITE_OTHER): Payer: Self-pay | Admitting: General Surgery

## 2014-03-12 ENCOUNTER — Encounter: Payer: Self-pay | Admitting: General Surgery

## 2014-03-12 VITALS — BP 148/70 | HR 80 | Resp 12 | Ht 60.0 in | Wt 131.0 lb

## 2014-03-12 DIAGNOSIS — C50912 Malignant neoplasm of unspecified site of left female breast: Secondary | ICD-10-CM

## 2014-03-12 NOTE — Progress Notes (Signed)
Patient ID: Andrea Rodgers, female   DOB: 18-Nov-1940, 74 y.o.   MRN: [id number]  Chief Complaint  Patient presents with  . Routine Post Op    left mastectomy    HPI Andrea Rodgers is a 74 y.o. female.  Here today for postoperative visit, left mastectomy done 02-25-14 States she is doing well. Drainage for past 3 days has been , 30 ml, 28 ml and 20 ml yesterday.   HPI  Past Medical History  Diagnosis Date  . Hypertension   . Hyperlipidemia   . Murmur     Past Surgical History  Procedure Laterality Date  . Abdominal hysterectomy    . Appendectomy    . Breast surgery Left 02/25/14    Modified radical mastectomy, T2 N1. Grade 3, ER, PR negative, HER-2/neu 3+.  . Mastectomy Left 02-25-14    Dr Bary Castilla    No family history on file.  Social History History  Substance Use Topics  . Smoking status: Never Smoker   . Smokeless tobacco: Never Used  . Alcohol Use: No    No Known Allergies  Current Outpatient Prescriptions  Medication Sig Dispense Refill  . alendronate (FOSAMAX) 70 MG tablet Take 70 mg by mouth once a week. Take with a full glass of water on an empty stomach.    Marland Kitchen amLODipine (NORVASC) 2.5 MG tablet Take 2.5 mg by mouth daily.    Marland Kitchen aspirin 81 MG tablet Take 81 mg by mouth daily.    Marland Kitchen losartan-hydrochlorothiazide (HYZAAR) 100-25 MG per tablet Take 1 tablet by mouth daily.    . Terbinafine HCl 1 % SOLN Apply 1-2 sprays topically 2 (two) times daily. Apply to area below both breasts twice daily 1 Bottle 0   No current facility-administered medications for this visit.    Review of Systems Review of Systems  Constitutional: Negative.   Respiratory: Negative.   Cardiovascular: Negative.     Blood pressure 148/70, pulse 80, resp. rate 12, height 5' (1.524 m), weight 131 lb (59.421 kg).  Physical Exam Physical Exam  Constitutional: She is oriented to person, place, and time. She appears well-developed and well-nourished.  Pulmonary/Chest:  Incision healing well  left chest. Drain removed.  Musculoskeletal:  ROM improving  Neurological: She is alert and oriented to person, place, and time.  Skin: Skin is warm and dry.     Assessment    Doing well status post left modified radical mastectomy    Plan    The importance of working on left shoulder mobility was discussed with the patient and separately her family. The use of a heating pad beginning at the top of the shoulder and extending down over the pectoralis muscle to minimize thermal injury was reviewed. Hotshot hours were encouraged. The goal of a full shoulder range of motion was demonstrated. She was made aware that it is not uncommon to develop a little bit of fluid collection after drain removal and that she should call if she is symptomatic.      PCP:  Almetta Lovely 03/14/2014, 8:45 AM

## 2014-03-12 NOTE — Patient Instructions (Signed)
May use heating pad to the left arm for comfort. May shower

## 2014-03-18 ENCOUNTER — Ambulatory Visit: Payer: Medicare Other | Admitting: General Surgery

## 2014-03-18 ENCOUNTER — Ambulatory Visit (INDEPENDENT_AMBULATORY_CARE_PROVIDER_SITE_OTHER): Payer: Self-pay | Admitting: General Surgery

## 2014-03-18 ENCOUNTER — Encounter: Payer: Self-pay | Admitting: General Surgery

## 2014-03-18 VITALS — BP 142/78 | HR 74 | Resp 14 | Ht 64.0 in | Wt 129.0 lb

## 2014-03-18 DIAGNOSIS — C50912 Malignant neoplasm of unspecified site of left female breast: Secondary | ICD-10-CM

## 2014-03-18 NOTE — Progress Notes (Signed)
Patient ID: Andrea Rodgers, female   DOB: 01/06/1941, 74 y.o.   MRN: [id number]  Chief Complaint  Patient presents with  . Routine Post Op    left mastectomy    HPI Andrea Rodgers is a 74 y.o. female.  Here today for postoperative visit, left mastectomy 02-25-14. She states she is doing well other than head congestion. She is currently on Zithromax that started yesterday.  The patient has been making use of local heat to improve her left shoulder range of motion.   HPI  Past Medical History  Diagnosis Date  . Hypertension   . Hyperlipidemia   . Murmur     Past Surgical History  Procedure Laterality Date  . Abdominal hysterectomy    . Appendectomy    . Breast surgery Left 02/25/14    Modified radical mastectomy, T2 N1. Grade 3, ER, PR negative, HER-2/neu 3+.  . Mastectomy Left 02-25-14    Dr Bary Castilla    No family history on file.  Social History History  Substance Use Topics  . Smoking status: Never Smoker   . Smokeless tobacco: Never Used  . Alcohol Use: No    No Known Allergies  Current Outpatient Prescriptions  Medication Sig Dispense Refill  . azithromycin (ZITHROMAX) 250 MG tablet Take 250 mg by mouth daily.    Marland Kitchen alendronate (FOSAMAX) 70 MG tablet Take 70 mg by mouth once a week. Take with a full glass of water on an empty stomach.    Marland Kitchen amLODipine (NORVASC) 2.5 MG tablet Take 2.5 mg by mouth daily.    Marland Kitchen aspirin 81 MG tablet Take 81 mg by mouth daily.    Marland Kitchen losartan-hydrochlorothiazide (HYZAAR) 100-25 MG per tablet Take 1 tablet by mouth daily.    . Terbinafine HCl 1 % SOLN Apply 1-2 sprays topically 2 (two) times daily. Apply to area below both breasts twice daily 1 Bottle 0   No current facility-administered medications for this visit.    Review of Systems Review of Systems  Constitutional: Negative.   Respiratory: Positive for cough.   Cardiovascular: Negative.     Blood pressure 142/78, pulse 74, resp. rate 14, height _0  (1.626 m), weight 129 lb  (58.514 kg).  Physical Exam Physical Exam  Constitutional: She is oriented to person, place, and time. She appears well-developed and well-nourished.  Pulmonary/Chest:  Left mastectomy site healing well, steri strips removed. No evidence of fluid accumulation status post drain removal last week.  Musculoskeletal:  Range of motion continue to improve  Neurological: She is alert and oriented to person, place, and time.  Skin: Skin is warm and dry.       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     Plan    The patient will be following up with her medical oncologist and next week. She can resume HER-2/neu targeted therapy at that time.       Follow up in 2 weeks. Continue to use heating pad.  PCP:  Benita Stabile  Dr. Charletta Cousin, Andrea Rodgers 03/18/2014, 8:51 PM

## 2014-03-18 NOTE — Patient Instructions (Signed)
Continue to use heating pad for range of motion

## 2014-03-19 ENCOUNTER — Ambulatory Visit: Payer: Self-pay | Admitting: Internal Medicine

## 2014-03-24 ENCOUNTER — Ambulatory Visit: Payer: Medicare Other | Admitting: General Surgery

## 2014-03-27 DIAGNOSIS — R0602 Shortness of breath: Secondary | ICD-10-CM

## 2014-04-01 ENCOUNTER — Encounter: Payer: Self-pay | Admitting: General Surgery

## 2014-04-01 ENCOUNTER — Ambulatory Visit (INDEPENDENT_AMBULATORY_CARE_PROVIDER_SITE_OTHER): Payer: Medicare HMO | Admitting: General Surgery

## 2014-04-01 VITALS — BP 140/62 | HR 80 | Resp 16 | Ht 60.0 in | Wt 130.0 lb

## 2014-04-01 DIAGNOSIS — C50912 Malignant neoplasm of unspecified site of left female breast: Secondary | ICD-10-CM

## 2014-04-01 NOTE — Progress Notes (Signed)
Patient ID: Andrea Rodgers, female   DOB: 1940-09-21, 74 y.o.   MRN: [id number]  Chief Complaint  Patient presents with  . Follow-up    HPI Andrea Rodgers is a 74 y.o. female.  here today for follow up visit, left mastectomy 02-25-14. She states she is doing well. She just left the cancer center from receiving Taxol. Her head congestion is much better.  HPI  Past Medical History  Diagnosis Date  . Hypertension   . Hyperlipidemia   . Murmur     Past Surgical History  Procedure Laterality Date  . Abdominal hysterectomy    . Appendectomy    . Breast surgery Left 02/25/14    Modified radical mastectomy, T2 N1. Grade 3, ER, PR negative, HER-2/neu 3+.  . Mastectomy Left 02-25-14    Dr Bary Castilla    No family history on file.  Social History History  Substance Use Topics  . Smoking status: Never Smoker   . Smokeless tobacco: Never Used  . Alcohol Use: No    No Known Allergies  Current Outpatient Prescriptions  Medication Sig Dispense Refill  . alendronate (FOSAMAX) 70 MG tablet Take 70 mg by mouth once a week. Take with a full glass of water on an empty stomach.    Marland Kitchen amLODipine (NORVASC) 2.5 MG tablet Take 2.5 mg by mouth daily.    Marland Kitchen aspirin 81 MG tablet Take 81 mg by mouth daily.    Marland Kitchen losartan-hydrochlorothiazide (HYZAAR) 100-25 MG per tablet Take 1 tablet by mouth daily.    . Terbinafine HCl 1 % SOLN Apply 1-2 sprays topically 2 (two) times daily. Apply to area below both breasts twice daily 1 Bottle 0   No current facility-administered medications for this visit.    Review of Systems Review of Systems  Constitutional: Negative.   Respiratory: Negative.   Cardiovascular: Negative.     Blood pressure 140/62, pulse 80, resp. rate 16, height 5' (1.524 m), weight 130 lb (58.968 kg).  Physical Exam Physical Exam  Constitutional: She is oriented to person, place, and time. She appears well-developed and well-nourished.  Neck: Neck supple.  Pulmonary/Chest:  Left  mastectomy site healing well.  Musculoskeletal:  Fair upper extremity range of motion  Lymphadenopathy:    She has no cervical adenopathy.  Neurological: She is alert and oriented to person, place, and time.  Skin: Skin is warm and dry.       Assessment    Doing well post mastectomy.    Plan    The importance of continued effort to improve her range of motion was emphasized.     Follow up in one month. RX given for prothesis.  PCP:  Almetta Lovely 04/01/2014, 8:56 PM

## 2014-04-01 NOTE — Patient Instructions (Signed)
The patient is aware to call back for any questions or concerns.  

## 2014-04-14 ENCOUNTER — Ambulatory Visit
Admit: 2014-04-14 | Disposition: A | Discharge status: Home / Self Care | Payer: Self-pay | Attending: Internal Medicine | Admitting: Internal Medicine

## 2014-05-08 LAB — CBC CANCER CENTER
BASOS ABS: 0 x10 3/mm (ref 0.0–0.1)
Basophil %: 0.2 %
EOS ABS: 0 x10 3/mm (ref 0.0–0.7)
Eosinophil %: 0.4 %
HCT: 31.2 % — ABNORMAL LOW (ref 35.0–47.0)
HGB: 10.5 g/dL — ABNORMAL LOW (ref 12.0–16.0)
Lymphocyte #: 1.3 x10 3/mm (ref 1.0–3.6)
Lymphocyte %: 32.1 %
MCH: 30.9 pg (ref 26.0–34.0)
MCHC: 33.8 g/dL (ref 32.0–36.0)
MCV: 91 fL (ref 80–100)
MONOS PCT: 10.3 %
Monocyte #: 0.4 x10 3/mm (ref 0.2–0.9)
NEUTROS ABS: 2.4 x10 3/mm (ref 1.4–6.5)
NEUTROS PCT: 57 %
PLATELETS: 267 x10 3/mm (ref 150–440)
RBC: 3.41 10*6/uL — AB (ref 3.80–5.20)
RDW: 14.6 % — ABNORMAL HIGH (ref 11.5–14.5)
WBC: 4.1 x10 3/mm (ref 3.6–11.0)

## 2014-05-08 LAB — HEPATIC FUNCTION PANEL A (ARMC)
ALT: 15 U/L
AST: 16 U/L
Albumin: 3.8 g/dL
Alkaline Phosphatase: 63 U/L
Bilirubin,Total: 0.5 mg/dL
Total Protein: 7.1 g/dL

## 2014-05-08 LAB — CREATININE, SERUM: Creatinine: 0.66 mg/dL

## 2014-05-13 ENCOUNTER — Ambulatory Visit (INDEPENDENT_AMBULATORY_CARE_PROVIDER_SITE_OTHER): Payer: Medicare HMO | Admitting: General Surgery

## 2014-05-13 ENCOUNTER — Encounter: Payer: Self-pay | Admitting: General Surgery

## 2014-05-13 VITALS — BP 152/72 | HR 82 | Resp 12 | Ht 60.0 in | Wt 131.0 lb

## 2014-05-13 DIAGNOSIS — C50912 Malignant neoplasm of unspecified site of left female breast: Secondary | ICD-10-CM

## 2014-05-13 LAB — CBC CANCER CENTER
Basophil #: 0 x10 3/mm (ref 0.0–0.1)
Basophil %: 0.4 %
Eosinophil #: 0 x10 3/mm (ref 0.0–0.7)
Eosinophil %: 0.3 %
HCT: 31.7 % — AB (ref 35.0–47.0)
HGB: 10.6 g/dL — AB (ref 12.0–16.0)
LYMPHS PCT: 29.2 %
Lymphocyte #: 1.3 x10 3/mm (ref 1.0–3.6)
MCH: 31.1 pg (ref 26.0–34.0)
MCHC: 33.5 g/dL (ref 32.0–36.0)
MCV: 93 fL (ref 80–100)
Monocyte #: 0.4 x10 3/mm (ref 0.2–0.9)
Monocyte %: 9.5 %
NEUTROS PCT: 60.6 %
Neutrophil #: 2.7 x10 3/mm (ref 1.4–6.5)
Platelet: 275 x10 3/mm (ref 150–440)
RBC: 3.41 10*6/uL — AB (ref 3.80–5.20)
RDW: 15 % — ABNORMAL HIGH (ref 11.5–14.5)
WBC: 4.4 x10 3/mm (ref 3.6–11.0)

## 2014-05-13 LAB — CREATININE, SERUM
CREATININE: 0.58 mg/dL
EGFR (African American): >60

## 2014-05-13 NOTE — Patient Instructions (Signed)
Follow up in 3 months.  Continue self breast exams. Call office for any new breast issues or concerns.  

## 2014-05-13 NOTE — Progress Notes (Signed)
Patient ID: Andrea Rodgers, female   DOB: Oct 14, 1940, 74 y.o.   MRN: [id number]  Chief Complaint  Patient presents with  . Routine Post Op    left mastectomy    HPI Andrea Rodgers is a 74 y.o. female.  Here today for postoperative visit, left mastectomy on 02-25-14. She states she is doing well. She goes to the [facility] today for another treatment. The patient is not aware of any difficulty with swelling or limited motion involving her left arm.   HPI  Past Medical History  Diagnosis Date  . Hypertension   . Hyperlipidemia   . Murmur     Past Surgical History  Procedure Laterality Date  . Abdominal hysterectomy    . Appendectomy    . Breast surgery Left 02/25/14    Modified radical mastectomy, T2 N1. Grade 3, ER, PR negative, HER-2/neu 3+.  . Mastectomy Left 02-25-14    Dr Bary Castilla    No family history on file.  Social History History  Substance Use Topics  . Smoking status: Never Smoker   . Smokeless tobacco: Never Used  . Alcohol Use: No    No Known Allergies  Current Outpatient Prescriptions  Medication Sig Dispense Refill  . cholecalciferol (VITAMIN D) 1000 UNITS tablet Take 1,000 Units by mouth daily.    Marland Kitchen alendronate (FOSAMAX) 70 MG tablet Take 70 mg by mouth once a week. Take with a full glass of water on an empty stomach.    Marland Kitchen amLODipine (NORVASC) 2.5 MG tablet Take 2.5 mg by mouth daily.    Marland Kitchen aspirin 81 MG tablet Take 81 mg by mouth daily.    Marland Kitchen losartan-hydrochlorothiazide (HYZAAR) 100-25 MG per tablet Take 1 tablet by mouth daily.    . Terbinafine HCl 1 % SOLN Apply 1-2 sprays topically 2 (two) times daily. Apply to area below both breasts twice daily 1 Bottle 0   No current facility-administered medications for this visit.    Review of Systems Review of Systems  Constitutional: Negative.   Respiratory: Negative.   Cardiovascular: Negative.     Blood pressure 152/72, pulse 82, resp. rate 12, height 5' (1.524 m), weight 131 lb (59.421  kg).  Physical Exam Physical Exam  Constitutional: She is oriented to person, place, and time. She appears well-developed and well-nourished.  Neck: Neck supple.  Pulmonary/Chest:  Left chest wall mass inciosn well healed.  Musculoskeletal:       Arms: Excellent range of motion upper extremities.  Lymphadenopathy:    She has no cervical adenopathy.  Small amount swelling left forearm.  Neurological: She is alert and oriented to person, place, and time.  Skin: Skin is warm and dry.       Assessment    Doing well status post left modified radical mastectomy.    Plan    The patient will report if she appreciates any arm swelling or discomfort. Follow up otherwise will be in 3 months.    Follow up in 3 months.   PCP:  Benita Stabile  Dr. Charletta Cousin, [place] Gleason 05/14/2014, 1:27 PM

## 2014-05-15 ENCOUNTER — Ambulatory Visit
Admit: 2014-05-15 | Disposition: A | Discharge status: Home / Self Care | Payer: Self-pay | Attending: Internal Medicine | Admitting: Internal Medicine

## 2014-05-16 LAB — CREATININE, SERUM: Creatine, Serum: 0.58

## 2014-05-20 LAB — CBC CANCER CENTER
Basophil #: 0 x10 3/mm (ref 0.0–0.1)
Basophil %: 0.2 %
EOS ABS: 0 x10 3/mm (ref 0.0–0.7)
Eosinophil %: 0.4 %
HCT: 31.1 % — AB (ref 35.0–47.0)
HGB: 10.4 g/dL — ABNORMAL LOW (ref 12.0–16.0)
LYMPHS PCT: 29.5 %
Lymphocyte #: 1.1 x10 3/mm (ref 1.0–3.6)
MCH: 31 pg (ref 26.0–34.0)
MCHC: 33.3 g/dL (ref 32.0–36.0)
MCV: 93 fL (ref 80–100)
MONOS PCT: 14.2 %
Monocyte #: 0.6 x10 3/mm (ref 0.2–0.9)
NEUTROS ABS: 2.2 x10 3/mm (ref 1.4–6.5)
NEUTROS PCT: 55.7 %
PLATELETS: 272 x10 3/mm (ref 150–440)
RBC: 3.35 10*6/uL — ABNORMAL LOW (ref 3.80–5.20)
RDW: 15.7 % — ABNORMAL HIGH (ref 11.5–14.5)
WBC: 3.9 x10 3/mm (ref 3.6–11.0)

## 2014-05-27 ENCOUNTER — Ambulatory Visit (INDEPENDENT_AMBULATORY_CARE_PROVIDER_SITE_OTHER): Payer: Medicare HMO | Admitting: General Surgery

## 2014-05-27 ENCOUNTER — Encounter: Payer: Self-pay | Admitting: General Surgery

## 2014-05-27 ENCOUNTER — Ambulatory Visit: Payer: Medicare HMO

## 2014-05-27 VITALS — BP 134/78 | HR 78 | Resp 16 | Ht 59.0 in | Wt 134.0 lb

## 2014-05-27 DIAGNOSIS — C50912 Malignant neoplasm of unspecified site of left female breast: Secondary | ICD-10-CM | POA: Diagnosis not present

## 2014-05-27 DIAGNOSIS — I890 Lymphedema, not elsewhere classified: Secondary | ICD-10-CM

## 2014-05-27 LAB — COMPREHENSIVE METABOLIC PANEL
ALBUMIN: 3.8 g/dL
ALT: 14 U/L
Alkaline Phosphatase: 69 U/L
Anion Gap: 5 — ABNORMAL LOW (ref 7–16)
BUN: 13 mg/dL
Bilirubin,Total: 0.6 mg/dL
CALCIUM: 8.6 mg/dL — AB
Chloride: 102 mmol/L
Co2: 29 mmol/L
Creatinine: 0.58 mg/dL
EGFR (Non-African Amer.): >60
Glucose: 106 mg/dL — ABNORMAL HIGH
Potassium: 3.4 mmol/L — ABNORMAL LOW
SGOT(AST): 18 U/L
Sodium: 136 mmol/L
TOTAL PROTEIN: 6.8 g/dL

## 2014-05-27 LAB — CBC CANCER CENTER
Basophil #: 0 x10 3/mm (ref 0.0–0.1)
Basophil %: 0.4 %
EOS ABS: 0 x10 3/mm (ref 0.0–0.7)
EOS PCT: 0.7 %
HCT: 33 % — ABNORMAL LOW (ref 35.0–47.0)
HGB: 10.9 g/dL — ABNORMAL LOW (ref 12.0–16.0)
Lymphocyte #: 1.2 x10 3/mm (ref 1.0–3.6)
Lymphocyte %: 30.4 %
MCH: 30.6 pg (ref 26.0–34.0)
MCHC: 33.1 g/dL (ref 32.0–36.0)
MCV: 92 fL (ref 80–100)
Monocyte #: 0.9 x10 3/mm (ref 0.2–0.9)
Monocyte %: 21.9 %
NEUTROS PCT: 46.6 %
Neutrophil #: 1.9 x10 3/mm (ref 1.4–6.5)
PLATELETS: 267 x10 3/mm (ref 150–440)
RBC: 3.57 10*6/uL — ABNORMAL LOW (ref 3.80–5.20)
RDW: 16.2 % — AB (ref 11.5–14.5)
WBC: 4 x10 3/mm (ref 3.6–11.0)

## 2014-05-27 LAB — CREATININE, SERUM
CREATINE, SERUM: 0.58
Creatine, Serum: 0.58

## 2014-05-27 NOTE — Patient Instructions (Addendum)
The patient is aware to call back for any questions or concerns.  Lymphedema Lymphedema is a swelling caused by the abnormal collection of lymph under the skin. The lymph is fluid from the tissues in your body that travels in the lymphatic system. This system is part of the immune system that includes lymph nodes and vessels. The lymph vessels collect and carry the excess fluid, fats, proteins, and wastes from the tissues of the body to the bloodstream. This system also works to clean and remove bacteria and waste products from the body.  Lymphedema occurs when the lymphatic system is blocked. When the lymph vessels or lymph nodes are blocked or damaged, lymph does not drain properly. This causes abnormal build up of lymph. This leads to swelling in the arms or legs. Lymphedema cannot be cured by medicines. But the swelling can be reduced by physical methods. CAUSES  There are two types of lymphedema. Primary lymphedema is caused by the absence or abnormality of the lymph vessel at birth. It is also known as inherited lymphedema, which occurs rarely. Secondary or acquired lymphedema occurs when the lymph vessel is damaged or blocked. The causes of lymph vessel blockage are:   Skin infection like cellulites.  Infection by parasites (filariasis).  Injury.  Cancer.  Radiation therapy.  Formation of scar tissue.  Surgery. SYMPTOMS  The symptoms of lymphedema are:  Abnormal swelling of the arm or leg.  Heavy or tight feeling in your arm or leg.  Tight-fitting shoes or rings.  Redness of skin over the affected area.  Limited movement of the affected limb.  Some patients complain about sensitivity to touch and discomfort in the limb(s) affected. You may not have these symptoms immediately following injury. They usually appear within a few days or even years after injury. Inform your caregiver, if you have any of these symptoms. Early treatment can avoid further problems.  DIAGNOSIS   First, your caregiver will inquire about any surgery you have had or medicines you are taking. He will then examine you. Your caregiver may order special imaging tests, such as:  Lymphoscintigraphy (a test in which a low dose of radioactive substance is injected to trace the flow of lymph through the lymph vessels).  MRI (imaging tests using magnetic fields).  Computed tomography (test using special cross-sectional X-rays).  Duplex ultrasound (test using high-frequency sound waves to show the vessels and the blood flow on a screen).  Lymphangiography (special X-ray taken after injecting a contrast dye into the lymph vessel). It is now rarely done. TREATMENT  Lymphedema can be treated in different ways. Your caregiver will decide the type of treatment depending on the cause. Treatment may include:  Exercise: Special exercises will help fluid move out easily from the affected part. This should be done as per your caregiver's advice.  Manual lymph drainage: Gentle massage of the affected limb makes the fluid to move out more freely.  Compression: Compression stockings or external pump apply pressure over the affected limb. This helps the fluid to move out from the arm or leg. Bandaging can also help to move the fluid out from the affected part. Your caregiver will decide the method that suits you the best.  Medicines: Your caregiver may prescribe antibiotics, if you have infection.  Surgery: Your caregiver may advise surgery for severe lymphedema. It is reserved for special cases when the patient has difficulty moving. Your surgeon may remove excess tissue from the arm or leg. This will help to ease your  movement. Physical therapy may have to be continued after surgery. HOME CARE INSTRUCTIONS  The area is very fragile and is predisposed to injury and infection.  Eat a healthy diet.  Exercise regularly as per advice.  Keep the affected area clean and dry.  Use gloves while cooking or  gardening.  Protect your skin from cuts.  Use electric razor to shave the affected area.  Keep affected limb elevated.  Do not wear tight clothes, shoes, or jewelry as it may cause the tissue to be strangled.  Do not use heat pads over the affected area.  Do not sit with cross legs.  Do not walk barefoot.  Do not carry weight on the affected arm.  Avoid having blood pressure checked on the affected limb. SEEK MEDICAL CARE IF:  You continue to have swelling in your limb. SEEK IMMEDIATE MEDICAL CARE IF:   You have high fever.  You have skin rash.  You have chills or sweats.  You have pain or redness.  You have a cut that does not heal. MAKE SURE YOU:   Understand these instructions.  Will watch your condition.  Will get help right away if you are not doing well or get worse. Document Released: 11/27/2006 Document Revised: 01/17/2012 Document Reviewed: 11/02/2008 [facility] Patient Information 2015 [place], [place]. This information is not intended to replace advice given to you by your health care provider. Make sure you discuss any questions you have with your health care provider.

## 2014-05-27 NOTE — Progress Notes (Signed)
Patient ID: Andrea Rodgers, female   DOB: May 08, 1940, 74 y.o.   MRN: [id number]  Chief Complaint  Patient presents with  . Follow-up    breast cancer    HPI Andrea Rodgers is a 74 y.o. female.  Here today for evaluation of left arm edema that was noted on her exam earlier in the day with the medical oncology service. At the time of her evaluation 2 weeks ago, no left upper extremity swelling was noted.   She is currently receiving chemotherapy for breast cancer and she states it is going well.    HPI  Past Medical History  Diagnosis Date  . Hypertension   . Hyperlipidemia   . Murmur   . Hyperlipemia   . Osteoporosis   . 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 2 cm axillary node. One of 11 nodes positive on post-adjuvant chemotherapy axillary dissection. 3+ centimeter tumor.    Past Surgical History  Procedure Laterality Date  . Abdominal hysterectomy    . Appendectomy    . Mastectomy Left 02-25-14    Dr Bary Castilla  . Breast surgery Left 02/25/14    Modified radical mastectomy, T2 N1. Grade 3, ER, PR negative, HER-2/neu 3+.    No family history on file.  Social History History  Substance Use Topics  . Smoking status: Never Smoker   . Smokeless tobacco: Never Used  . Alcohol Use: No    No Known Allergies  Current Outpatient Prescriptions  Medication Sig Dispense Refill  . alendronate (FOSAMAX) 70 MG tablet Take 70 mg by mouth once a week. Take with a full glass of water on an empty stomach.    Marland Kitchen amLODipine (NORVASC) 2.5 MG tablet Take 2.5 mg by mouth daily.    Marland Kitchen aspirin 81 MG tablet Take 81 mg by mouth daily.    . cholecalciferol (VITAMIN D) 1000 UNITS tablet Take 1,000 Units by mouth daily.    Marland Kitchen losartan-hydrochlorothiazide (HYZAAR) 100-25 MG per tablet Take 1 tablet by mouth daily.    . Multiple Vitamins-Minerals (MULTIVITAMIN WITH MINERALS) tablet Take 1 tablet by mouth daily.    .  promethazine (PHENERGAN) 25 MG tablet Take 25 mg by mouth every 6 (six) hours as needed for nausea or vomiting.    . simvastatin (ZOCOR) 40 MG tablet Take 40 mg by mouth at bedtime.    . Terbinafine HCl 1 % SOLN Apply 1-2 sprays topically 2 (two) times daily. Apply to area below both breasts twice daily 1 Bottle 0   No current facility-administered medications for this visit.    Review of Systems Review of Systems  Constitutional: Negative.   Respiratory: Negative.   Cardiovascular: Negative.     Blood pressure 134/78, pulse 78, resp. rate 16, height $RemoveBe'4\' 11"'XfVaMiFfG$  (1.499 m), weight 134 lb (60.782 kg).  Physical Exam Physical Exam  Constitutional: She is oriented to person, place, and time. She appears well-developed and well-nourished.  Musculoskeletal:       Arms: Lymphadenopathy:  Edema present left arm.  Neurological: She is alert and oriented to person, place, and time.  Skin: Skin is warm and dry.      Assessment    The patient had a single large axillary node, no report of extracapsular extension. Status post axillary dissection.  Acute development of left upper extremity swelling.      Plan    Venous ultrasound was completed to confirm that the swelling  was secondary to lymphedema rather than venous occlusion. The axillary vein, subclavian vein and proximal brachial veins are patent. Normal resting flow, augmentation with distal compression and respiratory variation.  Arrangements will be made for early assessment by the physical therapy department for decongestive therapy and sleeve fitting.       PCP:  Almetta Lovely 05/30/2014, 10:31 AM

## 2014-05-28 ENCOUNTER — Telehealth: Payer: Self-pay | Admitting: *Deleted

## 2014-05-28 NOTE — Telephone Encounter (Signed)
Faxed rehab referral form and demographics information to [phone],  to evaluate lymphedema per Dr Bary Castilla. Awaiting appointment time/date.

## 2014-05-30 ENCOUNTER — Encounter: Payer: Self-pay | Admitting: General Surgery

## 2014-05-30 DIAGNOSIS — I890 Lymphedema, not elsewhere classified: Secondary | ICD-10-CM | POA: Insufficient documentation

## 2014-06-02 ENCOUNTER — Encounter
Admit: 2014-06-02 | Disposition: A | Discharge status: Home / Self Care | Payer: Self-pay | Attending: General Surgery | Admitting: General Surgery

## 2014-06-03 LAB — CBC CANCER CENTER
BASOS ABS: 0 x10 3/mm (ref 0.0–0.1)
Basophil %: 0.4 %
EOS ABS: 0 x10 3/mm (ref 0.0–0.7)
EOS PCT: 1.1 %
HCT: 31.3 % — ABNORMAL LOW (ref 35.0–47.0)
HGB: 10.6 g/dL — ABNORMAL LOW (ref 12.0–16.0)
Lymphocyte #: 1.3 x10 3/mm (ref 1.0–3.6)
Lymphocyte %: 30.1 %
MCH: 31.2 pg (ref 26.0–34.0)
MCHC: 33.8 g/dL (ref 32.0–36.0)
MCV: 92 fL (ref 80–100)
MONO ABS: 0.6 x10 3/mm (ref 0.2–0.9)
Monocyte %: 13.4 %
NEUTROS PCT: 55 %
Neutrophil #: 2.4 x10 3/mm (ref 1.4–6.5)
PLATELETS: 252 x10 3/mm (ref 150–440)
RBC: 3.38 10*6/uL — AB (ref 3.80–5.20)
RDW: 16.4 % — ABNORMAL HIGH (ref 11.5–14.5)
WBC: 4.4 x10 3/mm (ref 3.6–11.0)

## 2014-06-06 ENCOUNTER — Emergency Department: Admit: 2014-06-06 | Disposition: A | Discharge status: Home / Self Care | Payer: Self-pay | Admitting: Student

## 2014-06-06 NOTE — Op Note (Signed)
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   OPERATIVE PROCEDURE: Right subclavian PowerPort placement with ultrasound and fluoroscopic guidance.   SURGEON: [name], MD   ANESTHESIA: Attended local, 10 mL of 1% plain Xylocaine.   ESTIMATED BLOOD LOSS: Minimal.  CLINICAL NOTE: This 74 year old woman was recently identified with a left breast mass. Biopsy showed evidence of invasive mammary carcinoma. As she has positive nodal disease, she is felt to be a candidate for neoadjuvant chemotherapy. Central venous access was requested by the treating oncologist.  PET CT obtained prior to surgery showed evidence of a cavitary lesion in the right lung suggestive of an infectious process. The patient is on antibiotics for that and did receive a PPD skin test today.   OPERATIVE NOTE: With the patient under adequate sedation, the right chest was prepped with ChloraPrep and draped. Ultrasound was used to confirm patency of the subclavian vein. Under ultrasound guidance, the vein was cannulated, followed by passage of a guidewire and a dilator sheath. The catheter was then passed into the SVC and right atrium under fluoroscopic guidance. When the catheter was positioned at the SVC/right atrial junction, there was poor rich blood return. Advancing the catheter into the right atrium showed good blood return and easy irrigation.   The catheter was tunneled to a pocket on the right anterior chest and the port anchored to the deep tissue with interrupted 3-0 Prolene sutures. Wound was closed with a running 3-0 Vicryl to the adipose layer and a running 4-0 Vicryl subcuticular suture for the skin. Benzoin and Steri-Strips were applied.  As the patient  is scheduled to begin chemotherapy tomorrow, the port was accessed and flushed with 10 mL of saline and clamped. This was done to minimize discomfort tomorrow.  Erect chest x-ray in the recovery room showed no evidence of pneumothorax. Catheter tip in the right atrium. A diffuse haziness of the right lower lobe consistent with the CT/PET scan findings is noted.    ____________________________ [name], MD jwb:ST D: 10/21/2013 17:26:49 ET T: 10/21/2013 23:24:58 ET JOB#: [id number]  cc: [name], MD, <Dictator> Leona Carry. Hall Busing, MD Martie Lee. Oliva Bustard, MD Capri Raben Amedeo Kinsman MD ELECTRONICALLY SIGNED 10/22/2013 11:18

## 2014-06-08 LAB — SURGICAL PATHOLOGY

## 2014-06-10 LAB — CBC CANCER CENTER
Basophil #: 0 x10 3/mm (ref 0.0–0.1)
Basophil %: 0.3 %
EOS ABS: 0 x10 3/mm (ref 0.0–0.7)
Eosinophil %: 1.1 %
HCT: 30.2 % — ABNORMAL LOW (ref 35.0–47.0)
HGB: 10.2 g/dL — ABNORMAL LOW (ref 12.0–16.0)
LYMPHS ABS: 1.1 x10 3/mm (ref 1.0–3.6)
Lymphocyte %: 27.1 %
MCH: 31.5 pg (ref 26.0–34.0)
MCHC: 33.8 g/dL (ref 32.0–36.0)
MCV: 93 fL (ref 80–100)
MONOS PCT: 11.2 %
Monocyte #: 0.4 x10 3/mm (ref 0.2–0.9)
Neutrophil #: 2.4 x10 3/mm (ref 1.4–6.5)
Neutrophil %: 60.3 %
PLATELETS: 271 x10 3/mm (ref 150–440)
RBC: 3.24 10*6/uL — AB (ref 3.80–5.20)
RDW: 17 % — ABNORMAL HIGH (ref 11.5–14.5)
WBC: 3.9 x10 3/mm (ref 3.6–11.0)

## 2014-06-14 NOTE — Op Note (Signed)
PATIENT NAME:  Andrea Rodgers, GAUSE MR#:  [id number] DATE OF BIRTH:  17-May-1940  DATE OF PROCEDURE:  02/25/2014   PREOPERATIVE DIAGNOSIS: Left breast cancer, unresponsive to neoadjuvant chemotherapy.   POSTOPERATIVE DIAGNOSIS: Left breast cancer, unresponsive to neoadjuvant chemotherapy.   OPERATIVE PROCEDURE: Left modified radical mastectomy.   SURGEON: Hervey Ard, MD   ANESTHESIA: General by LMA under Dr. Myra Gianotti.   ESTIMATED BLOOD LOSS: 50 mL   FLUID REPLACEMENT:  Crystalloid.   CLINICAL NOTE: This 74 year old woman had been noted to have a left breast mass with biopsy showing evidence of invasive mammary carcinoma. Based on size, she was a candidate for neoadjuvant chemotherapy, but only modest improvement was noted. She was felt to be a candidate for salvage mastectomy.   The patient received Kefzol prior to the procedure.   OPERATIVE NOTE: With the patient under adequate general anesthesia, the breast, chest, and axilla was prepped with ChloraPrep and draped. A slightly lower than normal elliptical incision was used to encompass the lower outer quadrant lesion at the 4 o'clock position. The skin flaps were incised sharply and the remaining dissection completed with electrocautery. Hemostasis was with electrocautery as well as 3-0 Vicryl ties. The breast was elevated to the sternum medially, clavicle superiorly, rectus fascia inferiorly and the serratus fascia laterally. The axillary envelope was opened and  dissection completed using Harmonic scalpel. The long thoracic nerve of Bell, thoracodorsal nerve, artery and vein bundles, and axillary vein were identified and protected. The specimen was sent fresh to pathology per protocol.   The wound was irrigated with sterile water. Good hemostasis was noted. A single Blake drain was brought out through the inferior medial flap and anchored in place with 3-0 nylon. The deep dermal tissue was then approximated with a running 2-0 Vicryl suture in  multiple segments. Benzoin and Steri-Strips were applied followed by Telfa, fluff gauze, Kerlix and an Ace wrap.   The patient tolerated the procedure well and was taken to the recovery room in stable condition.     ____________________________ [name], MD jwb:DT D: 02/25/2014 16:23:53 ET T: 02/25/2014 17:31:02 ET JOB#: [id number]  cc: [name], MD, <Dictator> Sandeep R. Ma Hillock, MD Raynell Upton Amedeo Kinsman MD ELECTRONICALLY SIGNED 02/26/2014 9:02

## 2014-06-15 ENCOUNTER — Encounter: Payer: Self-pay | Admitting: Occupational Therapy

## 2014-06-15 ENCOUNTER — Telehealth: Payer: Self-pay | Admitting: *Deleted

## 2014-06-15 ENCOUNTER — Ambulatory Visit: Payer: Medicare HMO | Attending: General Surgery | Admitting: Occupational Therapy

## 2014-06-15 DIAGNOSIS — I972 Postmastectomy lymphedema syndrome: Secondary | ICD-10-CM | POA: Diagnosis not present

## 2014-06-15 DIAGNOSIS — I890 Lymphedema, not elsewhere classified: Secondary | ICD-10-CM

## 2014-06-15 NOTE — Therapy (Signed)
[facility] [address], [place], [address] Phone: [phone]   Fax:  [phone]  Occupational Therapy Treatment  Patient Details  Name: Andrea Rodgers MRN: [id number] Date of Birth: 1940-09-08 Referring Provider:  Albina Billet, MD  Encounter Date: 06/15/2014      OT End of Session - 06/15/14 1323    Visit Number 6   Number of Visits 18   Date for OT Re-Evaluation 07/28/14   Authorization Type Medicare    OT Start Time 0956   OT Stop Time 6789   OT Time Calculation (min) 48 min   Activity Tolerance Patient tolerated treatment well      Past Medical History  Diagnosis Date  . Hypertension   . Hyperlipidemia   . Murmur   . Hyperlipemia   . Osteoporosis   . 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 2 cm axillary node. One of 11 nodes positive on post-adjuvant chemotherapy axillary dissection. 3+ centimeter tumor.    Past Surgical History  Procedure Laterality Date  . Abdominal hysterectomy    . Appendectomy    . Mastectomy Left 02-25-14    Dr Bary Castilla  . Breast surgery Left 02/25/14    Modified radical mastectomy, T2 N1. Grade 3, ER, PR negative, HER-2/neu 3+.    There were no vitals filed for this visit.  Visit Diagnosis:  Lymphedema of upper extremity      Subjective Assessment - 06/15/14 1044    Subjective  It was tough weekend, gettting tired of my bandages   Patient Stated Goals To get measured for compression garments - and get finish with bandages              LYMPHEDEMA/ONCOLOGY QUESTIONNAIRE - 06/15/14 1027    Treatment   Active Chemotherapy Treatment Yes   What other symptoms do you have   Are you having Pain No  Itching inside of elbow   Lymphedema Stage   Stage STAGE 2 SPONTANEOUSLY IRREVERSIBLE   Left Upper Extremity Lymphedema   15 cm Proximal to Olecranon Process 31.5 cm   10 cm Proximal  to Olecranon Process 28.6 cm   Olecranon Process 26 cm   15 cm Proximal to Ulnar Styloid Process 24.5 cm   10 cm Proximal to Ulnar Styloid Process 21.4 cm   Just Proximal to Ulnar Styloid Process 16.1 cm   Across Hand at [employer] 19.2 cm   At [place] of 2nd Digit 6.5 cm   At [facility] of Thumb 7 cm   Other Pt arrive with L UE Bandage on - bandages removed and measurements taken  ( see flow sheet) -  skin care done (Eucerin lotion applied to L UE )   Other Pt was send this session to get measured for compression garments day time and night time garments -                         OT Education - 06/15/14 1323    Education provided Yes   Person(s) Educated Patient   Methods Explanation;Demonstration;Handout;Verbal cues   Comprehension Verbalized understanding          OT Short Term Goals - 06/15/14 1332    OT SHORT TERM GOAL #1   Title L UE circumference decrease by 1 cm from hand to elbow and 2 cm uppper arm to be  fitted for appropriate compression garments    Status Achieved   OT SHORT TERM GOAL #2   Title Pt and family be independent with skin care , exercises and home program    Time 4   Status New           OT Long Term Goals - 26-Jun-2014 1335    OT LONG TERM GOAL #1   Title Pt and family will be I in wearing compression garments and  glove for day/night time to maintain L UE circumference    Time 4   Period Weeks   Status New               Plan - 06/26/2014 1326    Clinical Impression Statement Pt measurement decrease at hand , forearm  , elbow and upper arm - proximal forearm,and  wrist about the same -pt was send to get measured for compression garments - pt daugher to do bandage at home 2 x wk and OT will assess onetime a welek   Pt will benefit from skilled therapeutic intervention in order to improve on the following deficits (Retired) Increased edema   Rehab Potential Good   OT Frequency 1x / week   OT Duration 4 weeks   OT  Treatment/Interventions Self-care/ADL training;Compression bandaging;Manual lymph drainage;Manual Therapy;Therapeutic exercise   Plan Pt decrease to 1 x wk  while waiting for compression garments and await garments  - daughter bandage 2 x wk and OT assess if  maintaiining    OT Home Exercise Plan Bandating and ROM           G-Codes - 06/26/2014 1337    Functional Assessment Tool Used lymphedema circumference,  ROM , clincial judgement    Functional Limitation Self care   Self Care Current Status (O4175) At least 20 percent but less than 40 percent impaired, limited or restricted   Self Care Goal Status (F0104) At least 1 percent but less than 20 percent impaired, limited or restricted      Problem List Patient Active Problem List   Diagnosis Date Noted  . Lymphedema of arm 05/30/2014  . Breast cancer, stage 2 10/08/2013    Rosalyn Gess OTR/L, CLT Jun 26, 2014, 1:52 PM  [facility] [address], [place], [address] Phone: [phone]   Fax:  [phone]

## 2014-06-15 NOTE — Telephone Encounter (Signed)
Pt requested that I call in lidocaine cream instead of EMLA because her insurance would not pay for it.  I told her I would call [facility] and check with pharmacist.  I called and spoke to Ron the pharmacist and he states that her insurance did approve it but her copay was $31.00 and Ron could give it to her for $25.00.  He will run the lidocaine and see which one is cheaper and do the cheaper one and contact pt and let her know.

## 2014-06-15 NOTE — Patient Instructions (Addendum)
Reviewed with pt bandaging as well as daughter come last week and reviewed with her and provided hand out to do bandages 2 x wk and will check on her next weeek Pt can keep bandages off for about hour and hour and 1/2 and then need to be bandage again - daughter note send that Rosidal foam is now replacing wide artiflex   will follow up with pt in week

## 2014-06-16 ENCOUNTER — Other Ambulatory Visit: Payer: Self-pay

## 2014-06-16 ENCOUNTER — Other Ambulatory Visit: Payer: Self-pay | Admitting: Internal Medicine

## 2014-06-16 DIAGNOSIS — C50912 Malignant neoplasm of unspecified site of left female breast: Secondary | ICD-10-CM

## 2014-06-17 ENCOUNTER — Inpatient Hospital Stay: Payer: Medicare HMO

## 2014-06-17 ENCOUNTER — Inpatient Hospital Stay (HOSPITAL_BASED_OUTPATIENT_CLINIC_OR_DEPARTMENT_OTHER): Payer: Medicare HMO | Admitting: Internal Medicine

## 2014-06-17 ENCOUNTER — Encounter: Payer: Medicare PPO | Admitting: Occupational Therapy

## 2014-06-17 ENCOUNTER — Inpatient Hospital Stay: Payer: Medicare HMO | Attending: Internal Medicine

## 2014-06-17 VITALS — BP 136/73 | HR 79

## 2014-06-17 VITALS — BP 167/71 | HR 78 | Temp 97.7°F | Ht 58.98 in | Wt 132.5 lb

## 2014-06-17 DIAGNOSIS — C773 Secondary and unspecified malignant neoplasm of axilla and upper limb lymph nodes: Secondary | ICD-10-CM | POA: Insufficient documentation

## 2014-06-17 DIAGNOSIS — C50512 Malignant neoplasm of lower-outer quadrant of left female breast: Secondary | ICD-10-CM | POA: Insufficient documentation

## 2014-06-17 DIAGNOSIS — Z79899 Other long term (current) drug therapy: Secondary | ICD-10-CM

## 2014-06-17 DIAGNOSIS — Z5111 Encounter for antineoplastic chemotherapy: Secondary | ICD-10-CM | POA: Insufficient documentation

## 2014-06-17 DIAGNOSIS — E785 Hyperlipidemia, unspecified: Secondary | ICD-10-CM | POA: Diagnosis not present

## 2014-06-17 DIAGNOSIS — I10 Essential (primary) hypertension: Secondary | ICD-10-CM

## 2014-06-17 DIAGNOSIS — C50919 Malignant neoplasm of unspecified site of unspecified female breast: Secondary | ICD-10-CM

## 2014-06-17 DIAGNOSIS — Z9012 Acquired absence of left breast and nipple: Secondary | ICD-10-CM

## 2014-06-17 DIAGNOSIS — D649 Anemia, unspecified: Secondary | ICD-10-CM

## 2014-06-17 DIAGNOSIS — C884 Extranodal marginal zone B-cell lymphoma of mucosa-associated lymphoid tissue [MALT-lymphoma]: Secondary | ICD-10-CM

## 2014-06-17 DIAGNOSIS — Z171 Estrogen receptor negative status [ER-]: Secondary | ICD-10-CM

## 2014-06-17 DIAGNOSIS — Z7982 Long term (current) use of aspirin: Secondary | ICD-10-CM | POA: Insufficient documentation

## 2014-06-17 DIAGNOSIS — M810 Age-related osteoporosis without current pathological fracture: Secondary | ICD-10-CM | POA: Diagnosis not present

## 2014-06-17 DIAGNOSIS — C50912 Malignant neoplasm of unspecified site of left female breast: Secondary | ICD-10-CM

## 2014-06-17 DIAGNOSIS — M818 Other osteoporosis without current pathological fracture: Secondary | ICD-10-CM

## 2014-06-17 LAB — CBC WITH DIFFERENTIAL/PLATELET
Basophils Absolute: 0 10*3/uL (ref 0–0.1)
EOS ABS: 0 10*3/uL (ref 0–0.7)
HCT: 30.8 % — ABNORMAL LOW (ref 35.0–47.0)
Hemoglobin: 10.4 g/dL — ABNORMAL LOW (ref 12.0–16.0)
LYMPHS ABS: 1.2 10*3/uL (ref 1.0–3.6)
Lymphocytes Relative: 26 %
MCH: 31.5 pg (ref 26.0–34.0)
MCHC: 33.9 g/dL (ref 32.0–36.0)
MCV: 93 fL (ref 80.0–100.0)
MONO ABS: 0.5 10*3/uL (ref 0.2–0.9)
Monocytes Relative: 11 %
Neutro Abs: 2.8 10*3/uL (ref 1.4–6.5)
Neutrophils Relative %: 62 %
Platelets: 272 10*3/uL (ref 150–440)
RBC: 3.31 MIL/uL — ABNORMAL LOW (ref 3.80–5.20)
RDW: 17.2 % — ABNORMAL HIGH (ref 11.5–14.5)
WBC: 4.6 10*3/uL (ref 3.6–11.0)

## 2014-06-17 LAB — HEPATIC FUNCTION PANEL
ALT: 13 U/L — ABNORMAL LOW (ref 14–54)
AST: 19 U/L (ref 15–41)
Albumin: 3.7 g/dL (ref 3.5–5.0)
Alkaline Phosphatase: 59 U/L (ref 38–126)
Bilirubin, Direct: <0.1 mg/dL — ABNORMAL LOW (ref 0.1–0.5)
Total Bilirubin: 0.6 mg/dL (ref 0.3–1.2)
Total Protein: 6.6 g/dL (ref 6.5–8.1)

## 2014-06-17 LAB — CREATININE, SERUM
CREATININE: 0.58 mg/dL (ref 0.44–1.00)
GFR calc Af Amer: >60 mL/min (ref >60)
GFR calc non Af Amer: >60 mL/min (ref >60)

## 2014-06-17 MED ORDER — DEXTROSE 5 % IV SOLN
80.0000 mg/m2 | Freq: Once | INTRAVENOUS | Status: AC
  Administered 2014-06-17: 126 mg via INTRAVENOUS
  Filled 2014-06-17: qty 21

## 2014-06-17 MED ORDER — ACETAMINOPHEN 325 MG PO TABS
650.0000 mg | ORAL_TABLET | Freq: Once | ORAL | Status: AC
  Administered 2014-06-17: 650 mg via ORAL
  Filled 2014-06-17: qty 2

## 2014-06-17 MED ORDER — SODIUM CHLORIDE 0.9 % IV SOLN
Freq: Once | INTRAVENOUS | Status: AC
  Administered 2014-06-17: 250 mL via INTRAVENOUS
  Filled 2014-06-17: qty 250

## 2014-06-17 MED ORDER — DIPHENHYDRAMINE HCL 50 MG/ML IJ SOLN
25.0000 mg | Freq: Once | INTRAMUSCULAR | Status: AC
  Administered 2014-06-17: 25 mg via INTRAVENOUS
  Filled 2014-06-17: qty 1

## 2014-06-17 MED ORDER — FAMOTIDINE IN NACL 20-0.9 MG/50ML-% IV SOLN
20.0000 mg | Freq: Once | INTRAVENOUS | Status: AC
  Administered 2014-06-17: 20 mg via INTRAVENOUS
  Filled 2014-06-17: qty 50

## 2014-06-17 MED ORDER — TRASTUZUMAB CHEMO INJECTION 440 MG
2.0000 mg/kg | Freq: Once | INTRAVENOUS | Status: AC
  Administered 2014-06-17: 126 mg via INTRAVENOUS
  Filled 2014-06-17: qty 6

## 2014-06-17 MED ORDER — SODIUM CHLORIDE 0.9 % IJ SOLN
10.0000 mL | INTRAMUSCULAR | Status: DC | PRN
  Administered 2014-06-17: 10 mL
  Filled 2014-06-17: qty 10

## 2014-06-17 MED ORDER — HEPARIN SOD (PORK) LOCK FLUSH 100 UNIT/ML IV SOLN
500.0000 [IU] | Freq: Once | INTRAVENOUS | Status: AC | PRN
  Administered 2014-06-17: 500 [IU]
  Filled 2014-06-17: qty 5

## 2014-06-17 MED ORDER — SODIUM CHLORIDE 0.9 % IV SOLN
Freq: Once | INTRAVENOUS | Status: AC
  Administered 2014-06-17: 11:00:00 via INTRAVENOUS
  Filled 2014-06-17: qty 4

## 2014-06-22 ENCOUNTER — Ambulatory Visit: Payer: Medicare HMO | Admitting: Occupational Therapy

## 2014-06-22 DIAGNOSIS — I972 Postmastectomy lymphedema syndrome: Secondary | ICD-10-CM | POA: Diagnosis not present

## 2014-06-22 DIAGNOSIS — I890 Lymphedema, not elsewhere classified: Secondary | ICD-10-CM

## 2014-06-22 NOTE — Therapy (Signed)
[facility] [address] [place], [address] Phone: [phone]   Fax:  [phone]  Occupational Therapy Treatment  Patient Details  Name: RHIANNON SASSAMAN MRN: [id number] Date of Birth: [birth year] Referring Provider:  Albina Billet, MD  Encounter Date: 06/22/2014      OT End of Session - 06/22/14 1100    Visit Number 7   Number of Visits 18   Date for OT Re-Evaluation 07/28/14   Authorization Type Medicare    OT Start Time 0905   OT Stop Time 0940   OT Time Calculation (min) 35 min   Activity Tolerance Patient tolerated treatment well      Past Medical History  Diagnosis Date  . Hypertension   . Hyperlipidemia   . Murmur   . Hyperlipemia   . Osteoporosis   . 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 2 cm axillary node. One of 11 nodes positive on post-adjuvant chemotherapy axillary dissection. 3+ centimeter tumor.    Past Surgical History  Procedure Laterality Date  . Abdominal hysterectomy    . Appendectomy    . Mastectomy Left 02-25-14    Dr Bary Castilla  . Breast surgery Left 02/25/14    Modified radical mastectomy, T2 N1. Grade 3, ER, PR negative, HER-2/neu 3+.    There were no vitals filed for this visit.  Visit Diagnosis:  Lymphedema of upper extremity      Subjective Assessment - 06/22/14 0909    Subjective  We changing bandages Wed and Friday - I think my daughter did good - it just comes loose somewhat    Patient is accompained by: Family member   Patient Stated Goals Waiting for my compression garments to come in - and show duaghter again bandaging   Currently in Pain? No/denies             LYMPHEDEMA/ONCOLOGY QUESTIONNAIRE - 06/22/14 0913    Left Upper Extremity Lymphedema   15 cm Proximal to Olecranon Process 33.3 cm   10 cm Proximal to Olecranon Process 28.8 cm   Olecranon Process 28 cm   15 cm  Proximal to Ulnar Styloid Process 25 cm   10 cm Proximal to Ulnar Styloid Process 22.2 cm   Just Proximal to Ulnar Styloid Process 16.4 cm   Across Hand at [employer] 20.3 cm   At [place] of 2nd Digit 6.6 cm   At [facility] of Thumb 7 cm   Other Pt arrive with L UE Bandage on - bandages removed and measurements taken  ( see flow sheet) -  skin care done (Eucerin lotion applied to L UE )                         OT Education - 06/22/14 1100    Education provided Yes   Education Details See pt instructions   Person(s) Educated Patient   Methods Explanation;Demonstration;Verbal cues   Comprehension Verbalized understanding;Verbal cues required          OT Short Term Goals - 06/15/14 1332    OT SHORT TERM GOAL #1   Title L UE circumference decrease by 1 cm from hand to elbow and 2 cm uppper arm to be fitted for appropriate compression garments    Status Achieved   OT SHORT TERM GOAL #2   Title Pt and family be independent with  skin care , exercises and home program    Time 4   Status New           OT Long Term Goals - 06/15/14 1335    OT LONG TERM GOAL #1   Title Pt and family will be I in wearing compression garments and  glove for day/night time to maintain L UE circumference    Time 4   Period Weeks   Status New               Plan - 06/22/14 1101    Clinical Impression Statement Pt's daugher at the moment bandaging her 2 x wk and this OT assessing her 1 x wk - awaiting compression - her measurements at forearm , elbow and upper arm increased - education reviewed again and sequence - as well as hand out to duagher updated - will assess again in week    Pt will benefit from skilled therapeutic intervention in order to improve on the following deficits (Retired) Increased edema   Rehab Potential Good   OT Frequency 1x / week   OT Duration 4 weeks   OT Treatment/Interventions Self-care/ADL training;Compression bandaging;Manual lymph drainage;Manual  Therapy;Therapeutic exercise   OT Home Exercise Plan Bandating and ROM    Consulted and Agree with Plan of Care Patient        Problem List Patient Active Problem List   Diagnosis Date Noted  . Lymphedema of arm 05/30/2014  . Breast cancer, stage 2 10/08/2013    Rosalyn Gess  OTR/L, CLT 06/22/2014, 11:05 AM  [facility] [address] [place], [address] Phone: [phone]   Fax:  [phone]

## 2014-06-22 NOTE — Patient Instructions (Addendum)
Pt arrived with L UE bandage on - bandages removed and measurements taken (see treatment note) - skin care done (Eucerin lotion applied to L UE ) Fingerless Isotoner glove ( small ) , and Size 9 stockinette from fingertips to armpit. Artiflex 10 cm and Rosidal foam from wrist to upper arm Short stretch 6 cm wrist and hand, 8 cm hand to mid upper arm in figure 8's and 10 cm forearm to upper arm applied circular and at top UE figure 8  Tubegrip E topper at the top   Pt was ed and handout provided again and updated on sequence for bandages - daughter did not had 3 layers thru hand with artiflex, short stretch  As well as Rosidal foam needs to go one way up -  Also short stretch  To be layered like on page

## 2014-06-24 ENCOUNTER — Inpatient Hospital Stay: Payer: Medicare HMO

## 2014-06-24 VITALS — BP 145/73 | HR 72 | Temp 97.6°F | Resp 18

## 2014-06-24 DIAGNOSIS — C50912 Malignant neoplasm of unspecified site of left female breast: Secondary | ICD-10-CM

## 2014-06-24 DIAGNOSIS — Z5111 Encounter for antineoplastic chemotherapy: Secondary | ICD-10-CM | POA: Diagnosis not present

## 2014-06-24 LAB — CBC WITH DIFFERENTIAL/PLATELET
Basophils Absolute: 0 10*3/uL (ref 0–0.1)
Basophils Relative: 0 %
Eosinophils Absolute: 0 10*3/uL (ref 0–0.7)
Eosinophils Relative: 1 %
HEMATOCRIT: 28.9 % — AB (ref 35.0–47.0)
HEMOGLOBIN: 9.7 g/dL — AB (ref 12.0–16.0)
LYMPHS ABS: 1 10*3/uL (ref 1.0–3.6)
Lymphocytes Relative: 23 %
MCH: 31.3 pg (ref 26.0–34.0)
MCHC: 33.4 g/dL (ref 32.0–36.0)
MCV: 93.6 fL (ref 80.0–100.0)
MONO ABS: 0.5 10*3/uL (ref 0.2–0.9)
MONOS PCT: 11 %
NEUTROS PCT: 65 %
Neutro Abs: 2.8 10*3/uL (ref 1.4–6.5)
Platelets: 248 10*3/uL (ref 150–440)
RBC: 3.09 MIL/uL — AB (ref 3.80–5.20)
RDW: 16.8 % — ABNORMAL HIGH (ref 11.5–14.5)
WBC: 4.3 10*3/uL (ref 3.6–11.0)

## 2014-06-24 MED ORDER — HEPARIN SOD (PORK) LOCK FLUSH 100 UNIT/ML IV SOLN
500.0000 [IU] | Freq: Once | INTRAVENOUS | Status: AC | PRN
  Administered 2014-06-24: 500 [IU]
  Filled 2014-06-24: qty 5

## 2014-06-24 MED ORDER — PACLITAXEL CHEMO INJECTION 300 MG/50ML
80.0000 mg/m2 | Freq: Once | INTRAVENOUS | Status: AC
  Administered 2014-06-24: 126 mg via INTRAVENOUS
  Filled 2014-06-24: qty 21

## 2014-06-24 MED ORDER — SODIUM CHLORIDE 0.9 % IV SOLN
Freq: Once | INTRAVENOUS | Status: AC
  Administered 2014-06-24: 12:00:00 via INTRAVENOUS
  Filled 2014-06-24: qty 4

## 2014-06-24 MED ORDER — DIPHENHYDRAMINE HCL 50 MG/ML IJ SOLN
25.0000 mg | Freq: Once | INTRAMUSCULAR | Status: AC
  Administered 2014-06-24: 25 mg via INTRAVENOUS
  Filled 2014-06-24: qty 1

## 2014-06-24 MED ORDER — ACETAMINOPHEN 325 MG PO TABS
650.0000 mg | ORAL_TABLET | Freq: Once | ORAL | Status: AC
  Administered 2014-06-24: 650 mg via ORAL
  Filled 2014-06-24: qty 2

## 2014-06-24 MED ORDER — SODIUM CHLORIDE 0.9 % IV SOLN
Freq: Once | INTRAVENOUS | Status: DC
  Filled 2014-06-24: qty 250

## 2014-06-24 MED ORDER — SODIUM CHLORIDE 0.9 % IV SOLN
Freq: Once | INTRAVENOUS | Status: AC
  Administered 2014-06-24: 12:00:00 via INTRAVENOUS
  Filled 2014-06-24: qty 250

## 2014-06-24 MED ORDER — FAMOTIDINE IN NACL 20-0.9 MG/50ML-% IV SOLN
20.0000 mg | Freq: Once | INTRAVENOUS | Status: AC
  Administered 2014-06-24: 20 mg via INTRAVENOUS
  Filled 2014-06-24: qty 50

## 2014-06-24 MED ORDER — SODIUM CHLORIDE 0.9 % IJ SOLN
10.0000 mL | INTRAMUSCULAR | Status: DC | PRN
  Administered 2014-06-24: 10 mL
  Filled 2014-06-24: qty 10

## 2014-06-24 MED ORDER — SODIUM CHLORIDE 0.9 % IV SOLN
2.0000 mg/kg | Freq: Once | INTRAVENOUS | Status: AC
  Administered 2014-06-24: 126 mg via INTRAVENOUS
  Filled 2014-06-24: qty 6

## 2014-06-29 ENCOUNTER — Ambulatory Visit: Payer: Medicare HMO | Admitting: Occupational Therapy

## 2014-06-29 DIAGNOSIS — I972 Postmastectomy lymphedema syndrome: Secondary | ICD-10-CM | POA: Diagnosis not present

## 2014-06-29 DIAGNOSIS — I890 Lymphedema, not elsewhere classified: Secondary | ICD-10-CM

## 2014-06-29 NOTE — Therapy (Signed)
[facility] [address], [place], [address] Phone: [phone]   Fax:  [phone]  Occupational Therapy Treatment  Patient Details  Name: MYIAH PETKUS MRN: [id number] Date of Birth: 05-14-1940 Referring Provider:  Albina Billet, MD  Encounter Date: 06/29/2014      OT End of Session - 06/29/14 1355    Visit Number 8   Number of Visits 18   Date for OT Re-Evaluation 07/28/14   Authorization Type Medicare    OT Start Time 0939   OT Stop Time 1022   OT Time Calculation (min) 43 min   Activity Tolerance Patient tolerated treatment well      Past Medical History  Diagnosis Date  . Hypertension   . Hyperlipidemia   . Murmur   . Hyperlipemia   . Osteoporosis   . 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 2 cm axillary node. One of 11 nodes positive on post-adjuvant chemotherapy axillary dissection. 3+ centimeter tumor.    Past Surgical History  Procedure Laterality Date  . Abdominal hysterectomy    . Appendectomy    . Mastectomy Left 02-25-14    Dr Bary Castilla  . Breast surgery Left 02/25/14    Modified radical mastectomy, T2 N1. Grade 3, ER, PR negative, HER-2/neu 3+.    There were no vitals filed for this visit.  Visit Diagnosis:  Lymphedema of upper extremity      Subjective Assessment - 06/29/14 1050    Subjective  When I talked with the peole about the garments , it sounded like they did not order it yet - then bandage I am just tired of - and my first cousin past away yesterday and I dont' know when funeral is    Patient Stated Goals Waiting for my compression garments to come in    Currently in Pain? No/denies             LYMPHEDEMA/ONCOLOGY QUESTIONNAIRE - 06/29/14 0952    Left Upper Extremity Lymphedema   15 cm Proximal to Olecranon Process 31 cm   10 cm Proximal to Olecranon Process 28.4 cm   Olecranon  Process 26 cm   15 cm Proximal to Ulnar Styloid Process 25.6 cm   10 cm Proximal to Ulnar Styloid Process 22.7 cm   Just Proximal to Ulnar Styloid Process 17.1 cm   Across Hand at [employer] 21 cm   At [place] of 2nd Digit 6.7 cm   At [facility] of Thumb 7 cm                         OT Education - 06/29/14 1355    Education provided Yes   Education Details Review bandaging agian   Person(s) Educated Patient   Methods Explanation;Demonstration;Verbal cues   Comprehension Verbalized understanding          OT Short Term Goals - 06/15/14 1332    OT SHORT TERM GOAL #1   Title L UE circumference decrease by 1 cm from hand to elbow and 2 cm uppper arm to be fitted for appropriate compression garments    Status Achieved   OT SHORT TERM GOAL #2   Title Pt and family be independent with skin care , exercises and home program    Time 4   Status New  OT Long Term Goals - 06/15/14 1335    OT LONG TERM GOAL #1   Title Pt and family will be I in wearing compression garments and  glove for day/night time to maintain L UE circumference    Time 4   Period Weeks   Status New               Plan - 06/29/14 1357    Clinical Impression Statement Pt measurements increased in hand to forearm compare to last time and before - upper arm and elbow good - appear daughter had to much compression proximal than on hand and forearm - reviewed wit hpt again - pt to phone when she konw when funeral is and would bandage her tomrorow or Thursday prior to leaving to get measruements decrease d - talked with garments people - appear they were waiting for purchase order and I send them emais with okay - that  furnds were approved - so garments was not ordered as of this am  - pt to stop by and see if she needs to pay her part upfront prior to orde place    Pt will benefit from skilled therapeutic intervention in order to improve on the following deficits (Retired) Increased edema    Rehab Potential Good   OT Frequency 1x / week   OT Duration 4 weeks   OT Treatment/Interventions Self-care/ADL training;Compression bandaging;Manual lymph drainage;Manual Therapy;Therapeutic exercise   OT Home Exercise Plan Bandating and ROM    Consulted and Agree with Plan of Care Patient        Problem List Patient Active Problem List   Diagnosis Date Noted  . Lymphedema of arm 05/30/2014  . Breast cancer, stage 2 10/08/2013    Rosalyn Gess OTR/L ; CLT 06/29/2014, 2:01 PM  [facility] [address], [place], [address] Phone: [phone]   Fax:  [phone]

## 2014-06-29 NOTE — Patient Instructions (Signed)
Pt arrived with L UE bandage off-  measurements taken (see treatment note) - skin care done (Eucerin lotion applied to L UE ) Fingerless Isotoner glove ( small ) , and Size 9 stockinette from fingertips to armpit. Artiflex 10 cm and Rosidal foam from wrist to upper arm Short stretch 6 cm wrist and hand, 8 cm hand to mid upper arm in figure 8's and 10 cm forearm to upper arm applied circular and at top UE figure 8.  Reviewed with pt again - written instructions - daughter doing it

## 2014-06-30 ENCOUNTER — Ambulatory Visit: Payer: Medicare HMO | Admitting: Occupational Therapy

## 2014-06-30 DIAGNOSIS — I972 Postmastectomy lymphedema syndrome: Secondary | ICD-10-CM | POA: Diagnosis not present

## 2014-06-30 DIAGNOSIS — I890 Lymphedema, not elsewhere classified: Secondary | ICD-10-CM

## 2014-06-30 NOTE — Patient Instructions (Signed)
Daughter to change bandages Friday and if needed on Monday except if appt with me - reviewed again sequence verbally with pt of bandaging

## 2014-06-30 NOTE — Therapy (Signed)
[facility] [address] [place], [address] Phone: [phone]   Fax:  [phone]  Occupational Therapy Treatment  Patient Details  Name: Andrea Rodgers MRN: [id number] Date of Birth: 04-Mar-1940 Referring Provider:  Albina Billet, MD  Encounter Date: 06/30/2014      OT End of Session - 06/30/14 1422    Visit Number 9   Number of Visits 18   Date for OT Re-Evaluation 07/28/14   Authorization Type Medicare    OT Start Time 1218   OT Stop Time 1245   OT Time Calculation (min) 27 min   Activity Tolerance Patient tolerated treatment well   Behavior During Therapy [facility] for tasks assessed/performed      Past Medical History  Diagnosis Date  . Hypertension   . Hyperlipidemia   . Murmur   . Hyperlipemia   . Osteoporosis   . 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 2 cm axillary node. One of 11 nodes positive on post-adjuvant chemotherapy axillary dissection. 3+ centimeter tumor.    Past Surgical History  Procedure Laterality Date  . Abdominal hysterectomy    . Appendectomy    . Mastectomy Left 02-25-14    Dr Bary Castilla  . Breast surgery Left 02/25/14    Modified radical mastectomy, T2 N1. Grade 3, ER, PR negative, HER-2/neu 3+.    There were no vitals filed for this visit.  Visit Diagnosis:  Lymphedema of upper extremity - Plan: Ot plan of care cert/re-cert      Subjective Assessment - 06/30/14 1418    Subjective  I am leaving tomorrow for the funeral and not sure if my daughter can go - and my bandages was so great yesterday and that was why I wanted you to do it for me today - before I leave - I hope my garments are in next weeek    Patient Stated Goals Waiting for my compression garments to come in    Currently in Pain? No/denies             LYMPHEDEMA/ONCOLOGY QUESTIONNAIRE - 06/30/14 1219    What other symptoms  do you have   Are you having Pain No   Lymphedema Stage   Stage STAGE 2 SPONTANEOUSLY IRREVERSIBLE   Left Upper Extremity Lymphedema   15 cm Proximal to Olecranon Process 31 cm   10 cm Proximal to Olecranon Process 29 cm   Olecranon Process 25.6 cm   15 cm Proximal to Ulnar Styloid Process 25 cm   10 cm Proximal to Ulnar Styloid Process 21.4 cm   Just Proximal to Ulnar Styloid Process 16 cm   Across Hand at [employer] 19.5 cm   At [place] of 2nd Digit 6.5 cm   At [facility] of Thumb 7 cm   Other Pt arrive with L UE Bandage on - bandages removed and measurements taken  ( see flow sheet) -  skin care done (Eucerin lotion applied to L UE )                         OT Education - 06/30/14 1421    Education provided Yes   Education Details bandaging again   [employer]) Educated Patient   Methods Explanation;Demonstration   Comprehension Verbalized understanding          OT Short Term Goals - 06/15/14 1332  OT SHORT TERM GOAL #1   Title L UE circumference decrease by 1 cm from hand to elbow and 2 cm uppper arm to be fitted for appropriate compression garments    Status Achieved   OT SHORT TERM GOAL #2   Title Pt and family be independent with skin care , exercises and home program    Time 4   Status New           OT Long Term Goals - 06/15/14 1335    OT LONG TERM GOAL #1   Title Pt and family will be I in wearing compression garments and  glove for day/night time to maintain L UE circumference    Time 4   Period Weeks   Status New               Plan - 06/30/14 1423    Clinical Impression Statement All measurements that were increase yesterday - come down from digits to elbow after bandage by this OT - pt leaving for funeral out of town and was seen today for bandaging again prior to leaving - will return to clinic early next week and hoping compression sleeves will be in    Pt will benefit from skilled therapeutic intervention in order to improve on  the following deficits (Retired) Increased edema   Rehab Potential Good   OT Frequency 2x / week   OT Duration 2 weeks   OT Treatment/Interventions Self-care/ADL training;Compression bandaging;Manual lymph drainage;Manual Therapy;Therapeutic exercise   Plan Pt will return on Monday - will reassess if measurements appropriate to fit in compression - if not increased - daughter to do bandaging 1 or 2x while out of state for funeral    OT Home Exercise Plan Bandating and ROM    Consulted and Agree with Plan of Care Patient        Problem List Patient Active Problem List   Diagnosis Date Noted  . Lymphedema of arm 05/30/2014  . Breast cancer, stage 2 10/08/2013    Rosalyn Gess  OTR/L, CLT 06/30/2014, 2:30 PM  [facility] [address], [place], [address] Phone: [phone]   Fax:  [phone]

## 2014-07-01 ENCOUNTER — Inpatient Hospital Stay: Payer: Medicare HMO

## 2014-07-01 ENCOUNTER — Encounter (INDEPENDENT_AMBULATORY_CARE_PROVIDER_SITE_OTHER): Payer: Self-pay

## 2014-07-01 VITALS — BP 128/76 | HR 75 | Temp 96.5°F | Resp 18

## 2014-07-01 DIAGNOSIS — C50912 Malignant neoplasm of unspecified site of left female breast: Secondary | ICD-10-CM

## 2014-07-01 DIAGNOSIS — C50919 Malignant neoplasm of unspecified site of unspecified female breast: Secondary | ICD-10-CM

## 2014-07-01 DIAGNOSIS — Z5111 Encounter for antineoplastic chemotherapy: Secondary | ICD-10-CM | POA: Diagnosis not present

## 2014-07-01 LAB — CBC WITH DIFFERENTIAL/PLATELET
BASOS ABS: 0 10*3/uL (ref 0–0.1)
BASOS PCT: 0 %
EOS ABS: 0 10*3/uL (ref 0–0.7)
Eosinophils Relative: 1 %
HCT: 30.3 % — ABNORMAL LOW (ref 35.0–47.0)
HEMOGLOBIN: 10.1 g/dL — AB (ref 12.0–16.0)
Lymphocytes Relative: 26 %
Lymphs Abs: 1.1 10*3/uL (ref 1.0–3.6)
MCH: 31.6 pg (ref 26.0–34.0)
MCHC: 33.3 g/dL (ref 32.0–36.0)
MCV: 94.8 fL (ref 80.0–100.0)
MONOS PCT: 12 %
Monocytes Absolute: 0.5 10*3/uL (ref 0.2–0.9)
NEUTROS PCT: 61 %
Neutro Abs: 2.5 10*3/uL (ref 1.4–6.5)
PLATELETS: 259 10*3/uL (ref 150–440)
RBC: 3.19 MIL/uL — ABNORMAL LOW (ref 3.80–5.20)
RDW: 17.3 % — ABNORMAL HIGH (ref 11.5–14.5)
WBC: 4.1 10*3/uL (ref 3.6–11.0)

## 2014-07-01 LAB — CREATININE, SERUM
Creatinine, Ser: 0.5 mg/dL (ref 0.44–1.00)
GFR calc Af Amer: >60 mL/min (ref >60)

## 2014-07-01 MED ORDER — HEPARIN SOD (PORK) LOCK FLUSH 100 UNIT/ML IV SOLN
500.0000 [IU] | Freq: Once | INTRAVENOUS | Status: AC | PRN
  Administered 2014-07-01: 500 [IU]
  Filled 2014-07-01 (×2): qty 5

## 2014-07-01 MED ORDER — TRASTUZUMAB CHEMO INJECTION 440 MG
6.0000 mg/kg | Freq: Once | INTRAVENOUS | Status: AC
  Administered 2014-07-01: 357 mg via INTRAVENOUS
  Filled 2014-07-01: qty 17

## 2014-07-01 MED ORDER — DIPHENHYDRAMINE HCL 25 MG PO CAPS
25.0000 mg | ORAL_CAPSULE | Freq: Once | ORAL | Status: AC
  Administered 2014-07-01: 25 mg via ORAL
  Filled 2014-07-01: qty 1

## 2014-07-01 MED ORDER — ACETAMINOPHEN 325 MG PO TABS
650.0000 mg | ORAL_TABLET | Freq: Once | ORAL | Status: AC
  Administered 2014-07-01: 650 mg via ORAL
  Filled 2014-07-01: qty 2

## 2014-07-01 MED ORDER — SODIUM CHLORIDE 0.9 % IJ SOLN
10.0000 mL | INTRAMUSCULAR | Status: DC | PRN
  Administered 2014-07-01: 10 mL
  Filled 2014-07-01: qty 10

## 2014-07-06 ENCOUNTER — Encounter: Payer: Medicare PPO | Admitting: Occupational Therapy

## 2014-07-06 ENCOUNTER — Ambulatory Visit: Payer: Medicare HMO | Admitting: Occupational Therapy

## 2014-07-07 ENCOUNTER — Ambulatory Visit: Payer: Medicare HMO | Admitting: Occupational Therapy

## 2014-07-07 DIAGNOSIS — I890 Lymphedema, not elsewhere classified: Secondary | ICD-10-CM

## 2014-07-07 DIAGNOSIS — I972 Postmastectomy lymphedema syndrome: Secondary | ICD-10-CM | POA: Diagnosis not present

## 2014-07-07 NOTE — Therapy (Signed)
[facility] [address] [place], [address] Phone: [phone]   Fax:  [phone]  Occupational Therapy Treatment  Patient Details  Name: MERINDA VICTORINO MRN: [id number] Date of Birth: 15-Apr-1940 Referring Provider:  [name], MD  Encounter Date: 07/07/2014      OT End of Session - 07/07/14 1543    Visit Number 10   Number of Visits 18   Date for OT Re-Evaluation 07/28/14   Authorization Type Medicare    OT Start Time 1355   OT Stop Time 1428   OT Time Calculation (min) 33 min   Activity Tolerance Patient tolerated treatment well   Behavior During Therapy [facility] for tasks assessed/performed      Past Medical History  Diagnosis Date  . Hypertension   . Hyperlipidemia   . Murmur   . Hyperlipemia   . Osteoporosis   . 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 2 cm axillary node. One of 11 nodes positive on post-adjuvant chemotherapy axillary dissection. 3+ centimeter tumor.    Past Surgical History  Procedure Laterality Date  . Abdominal hysterectomy    . Appendectomy    . Mastectomy Left 02-25-14    Dr Bary Castilla  . Breast surgery Left 02/25/14    Modified radical mastectomy, T2 N1. Grade 3, ER, PR negative, HER-2/neu 3+.    There were no vitals filed for this visit.  Visit Diagnosis:  Lymphedema of upper extremity      Subjective Assessment - 07/07/14 1537    Subjective  I did got my sleeves yesterday and wore the sleeve/glove - I did not sleep to good it bothered me and rubb som on the inside of my armpit - until when do I need to wear it    Patient Stated Goals prevent my arm from swelling up again    Currently in Pain? No/denies             LYMPHEDEMA/ONCOLOGY QUESTIONNAIRE - 07/07/14 1406    Left Upper Extremity Lymphedema   10 cm Proximal to Olecranon Process (p) 29.5 cm   Olecranon Process (p) 26  cm   15 cm Proximal to Ulnar Styloid Process (p) 26 cm   10 cm Proximal to Ulnar Styloid Process (p) 22 cm   Just Proximal to Ulnar Styloid Process (p) 16.3 cm   Across Hand at [employer] (p) 20 cm   At [place] of 2nd Digit (p) 6.2 cm   At Base of Thumb (p) 6.8 cm                 OT Treatments/Exercises (OP) - 07/07/14 0001    ADLs   ADL Comments Assess pt's fit for daytime garment and  glove and night time Jubilee sleeve with power sleeve - needed education on donning and doffing as wellas wearing - needed min A with min v/c    Manual Therapy   Manual therapy comments Assess circumference and compare to previous measurments                 OT Education - 07/07/14 1543    Education provided Yes   Education Details see pt instruction    Person(s) Educated Patient   Methods Explanation;Demonstration;Tactile cues;Verbal cues   Comprehension Verbal cues required;Returned demonstration;Verbalized understanding          OT Short Term Goals - 07/07/14 1547  OT SHORT TERM GOAL #2   Title Pt and family be independent with skin care , exercises and home program    Status Achieved           OT Long Term Goals - 07/07/14 1547    OT LONG TERM GOAL #1   Time 2   Period Weeks   Status On-going               Plan - 07/07/14 1544    Clinical Impression Statement Pt measurement increase about 0.5 to 56mc for forearm to upper arm - pt to wear night time garment long in am or pm when staying home - pt still receiving chemo - pt was ed on donning and doffing as well as wearing garments correctly - will assess pt in 2 wks again and compare measurement - she is in harmony daytime compression sleeve and glove - not conttainng her as good but more comrfortable and easier for her to wear    Pt will benefit from skilled therapeutic intervention in order to improve on the following deficits (Retired) Increased edema   Rehab Potential Good   OT Frequency 1x / week    OT Duration 2 weeks   OT Treatment/Interventions Self-care/ADL training;Manual lymph drainage;DME and/or AE instruction   OT Home Exercise Plan see pt instruction    Consulted and Agree with Plan of Care Patient        Problem List Patient Active Problem List   Diagnosis Date Noted  . Lymphedema of arm 05/30/2014  . Breast cancer, stage 2 10/08/2013    Rosalyn Gess OTR/L, CLT 07/07/2014, 3:48 PM  [place] [facility] [address] [place], [address] Phone: [phone]   Fax:  [phone]

## 2014-07-07 NOTE — Patient Instructions (Signed)
On donning and doffing of night sleeve - lip on outside of shoulder and can leave powersleeve on over hand and pull up at night time   Daytime compression sleeve - fold double and donn - pull gradually up fold for fold until above elbow and then pull on band - oval on inside elbow   Can wear night time sleeve for longer in AM and PM  To decrease swelling -

## 2014-07-08 NOTE — Progress Notes (Signed)
[place]  [phone] [phone]     ID: Andrea Rodgers OB: [birth year]  MR#: [id number]  YIR#:[id number]  Patient Care Team: Albina Billet, MD as PCP - General (Internal Medicine) [name], MD as Consulting Physician (General Surgery) Albina Billet, MD (Internal Medicine)  CHIEF COMPLAINT/DIAGNOSIS:  1. 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  (ER -, PR weak + at 1-10%, Her-2/neu positive at 3+ on IHC). 10/16/13 - PET scan. IMPRESSION:  2.8 cm soft tissue lesion in the lower outer left breast, corresponding to known primary bronchogenic neoplasm. Associated 11 mm short axis left axillary nodal metastasis. No evidence of distant metastases. Mildly cystic cavitary lesion with surrounding opacity and bronchial wall thickening in the right upper lobe, as described above. Got neoadjuvant chemotherapy with Adriamycin/Cytoxan x 4  (given concurrent large lung lesion being lymphoma) from 11/10/13 - 01/12/14. Minimal response of breast tumor. Underwent left modified radical mastectomy on 02/25/14. Surgical pathology report.  A.LEFT BREAST AND AXILLARY LYMPH NODES; MODIFIED RADICAL MASTECTOMY: - INVASIVE MAMMARY CARCINOMA OF NO SPECIAL TYPE, 3.2 CM, GRADE 3. - MARGINS UNINVOLVED. - METASTATIC CARCINOMA IN ONE OF 11 AXILLARY LYMPH NODES (1/11).  2. MALT lymphoma right lung, stage IVE.  Right upper lung large cavity abnormality on PET/CT scan of 10/16/13 - bronchoscopy/bx on 10/30/13 reports:RIGHT UPPER LOBE BIOPSY POSITIVE FOR MALIGNANT CELLS. EXTRANODAL MARIGNAL ZONE LYMPHOMA OF MUCOSA-ASSOCIATED LYMPHOID TISSUE (MALT LYMPHOMA). SEE COMMENT.     HISTORY OF PRESENT ILLNESS:  Patient returns for continued oncology follow-up  and plan next dose of chemotherapy with weekly Taxol and Herceptin. States that she is doing steady and denies any new complaints. Continues to eat well. She is s/p modified radical mastectomy. States that chronic fatigue on exertion is unchanged, remains physically active. No new leg swelling. No new paresthesias in the extremities. No new dyspnea, chest pain, or hemoptysis.  No pain issues, 0/10. No new mood disturbances. No fevers.  REVIEW OF SYSTEMS:   ROS As in HPI above. In addition, no fever, chills or sweats. No new headaches or focal weakness.  No new mood disturbances. No  sore throat, cough, shortness of breath, sputum, hemoptysis or chest pain. No dizziness or palpitation. No abdominal pain, constipation, diarrhea, dysuria or hematuria. No new skin rash or bleeding symptoms. No new paresthesias in extremities.  Otherwise, a complete review of systems is negative.  PAST MEDICAL HISTORY: Past Medical History  Diagnosis Date  . Hypertension   . Hyperlipidemia   . Murmur   . Hyperlipemia   . Osteoporosis   . 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 2 cm axillary node. One of 11 nodes positive on post-adjuvant chemotherapy axillary dissection. 3+ centimeter tumor.    PAST SURGICAL HISTORY: Past Surgical History  Procedure Laterality Date  . Abdominal hysterectomy    . Appendectomy    . Mastectomy Left 02-25-14    Dr Bary Castilla  . Breast surgery Left 02/25/14    Modified radical mastectomy, T2 N1. Grade 3, ER, PR negative, HER-2/neu 3+.  Hypertension  Murmur  Hyperlipidemia  Osteoporosis  Abdominal hysterectomy  Appendectomy  Gyn hx: G3P3. She is postmenopausal.   FAMILY HISTORY - remarkable for hypertension.  Denies malignancy or hematologic disorders.  SOCIAL HISTORY: History  Substance Use Topics  . Smoking status: Never Smoker   . Smokeless tobacco: Never Used  . Alcohol Use: No  Denies smoking  or alcohol usage. Physically active and ambulatory. Lives with her husband.  No Known Allergies  Current Outpatient Prescriptions  Medication Sig Dispense Refill  . alendronate (FOSAMAX) 70 MG tablet Take 70 mg by mouth once a week. Take with a full glass of water on an empty stomach.    Marland Kitchen amLODipine (NORVASC) 2.5 MG tablet Take 2.5 mg by mouth daily.    Marland Kitchen aspirin 81 MG tablet Take 81 mg by mouth daily.    . cholecalciferol (VITAMIN D) 1000 UNITS tablet Take 1,000 Units by mouth daily.    Marland Kitchen losartan-hydrochlorothiazide (HYZAAR) 100-25 MG per tablet Take 1 tablet by mouth daily.    . Multiple Vitamins-Minerals (MULTIVITAMIN WITH MINERALS) tablet Take 1 tablet by mouth daily.    . promethazine (PHENERGAN) 25 MG tablet Take 25 mg by mouth every 6 (six) hours as needed for nausea or vomiting.    . simvastatin (ZOCOR) 40 MG tablet Take 40 mg by mouth at bedtime.    . Terbinafine HCl 1 % SOLN Apply 1-2 sprays topically 2 (two) times daily. Apply to area below both breasts twice daily 1 Bottle 0   No current facility-administered medications for this visit.   Facility-Administered Medications Ordered in Other Visits  Medication Dose Route Frequency Provider Last Rate Last Dose  . sodium chloride 0.9 % injection 10 mL  10 mL Intracatheter PRN Leia Alf, MD   10 mL at 06/17/14 0930    OBJECTIVE: Filed Vitals:   06/17/14 0951  BP: 167/71  Pulse: 78  Temp: 97.7 F (36.5 C)     Body mass index is 26.78 kg/(m^2).    ECOG FS:2 - Symptomatic, <50% confined to bed  GENERAL: Patient is alert and oriented and in no acute distress. There is no icterus. HEENT: EOMs intact. Oral exam negative for thrush or lesions. No cervical lymphadenopathy. CVS: S1S2, regular LUNGS: Bilaterally clear to auscultation, no rhonchi. ABDOMEN: Soft, nontender. No hepatosplenomegaly clinically.  NEURO: grossly nonfocal, cranial nerves are intact. Gait unremarkable. EXTREMITIES: No pedal edema.   LAB RESULTS:      Component Value Date/Time   NA 136 05/27/2014 0942   NA 142 10/08/2013 1631   K 3.4* 05/27/2014 0942   K 3.7 10/08/2013 1631   CL 102 05/27/2014 0942   CL 98 10/08/2013 1631   CO2 29 05/27/2014 0942   CO2 27 10/08/2013 1631   GLUCOSE 106* 05/27/2014 0942   GLUCOSE 110* 10/08/2013 1631   BUN 13 05/27/2014 0942   BUN 10 10/08/2013 1631   CREATININE 0.50 07/01/2014 1031   CREATININE 0.58 05/27/2014 0942   CALCIUM 8.6* 05/27/2014 0942   CALCIUM 9.4 10/08/2013 1631   PROT 6.6 06/17/2014 0924   PROT 6.8 05/27/2014 0942   PROT 7.9 10/08/2013 1631   ALBUMIN 3.7 06/17/2014 0924   ALBUMIN 3.8 05/27/2014 0942   AST 19 06/17/2014 0924   AST 18 05/27/2014 0942   ALT 13* 06/17/2014 0924   ALT 14 05/27/2014 0942   ALKPHOS 59 06/17/2014 0924   ALKPHOS 69 05/27/2014 0942   BILITOT 0.6 06/17/2014 0924   GFRNONAA >60 07/01/2014 1031   GFRNONAA >60 05/27/2014 0942   GFRAA >60 07/01/2014 1031   GFRAA >60 05/27/2014 0942       Chemistry      Component  Value Date/Time   NA 136 05/27/2014 0942   NA 142 10/08/2013 1631   K 3.4* 05/27/2014 0942   K 3.7 10/08/2013 1631   CL 102 05/27/2014 0942   CL 98 10/08/2013 1631   CO2 29 05/27/2014 0942   CO2 27 10/08/2013 1631   BUN 13 05/27/2014 0942   BUN 10 10/08/2013 1631   CREATININE 0.50 07/01/2014 1031   CREATININE 0.58 05/27/2014 0942      Component Value Date/Time   CALCIUM 8.6* 05/27/2014 0942   CALCIUM 9.4 10/08/2013 1631   ALKPHOS 59 06/17/2014 0924   ALKPHOS 69 05/27/2014 0942   AST 19 06/17/2014 0924   AST 18 05/27/2014 0942   ALT 13* 06/17/2014 0924   ALT 14 05/27/2014 0942   BILITOT 0.6 06/17/2014 0924       Lab Results  Component Value Date   LABCA2 63.6* 10/08/2013   STUDIES:   ASSESSMENT / PLAN:   1. 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 10/16/13 - PET scan. IMPRESSION:  2.8 cm  soft tissue lesion in the lower outer left breast, corresponding to known primary bronchogenic neoplasm. Associated 11 mm short axis left axillary nodal metastasis. No evidence of distant metastases. Mildly cystic cavitary lesion with surrounding opacity and bronchial wall thickening in the right upper lobe, as described above. Got neoadjuvant chemotherapy with Adriamycin/Cytoxan x 4  (given concurrent large lung lesion being lymphoma) from 11/10/13 - 01/12/14. Minimal response of breast tumor. Underwent left modified radical mastectomy on 02/25/14 (Surgical pathology report. INVASIVE MAMMARY CARCINOMA OF NO SPECIAL TYPE, 3.2 CM, GRADE 3. MARGINS UNINVOLVED. METASTATIC CARCINOMA IN ONE OF 11 AXILLARY LYMPH NODES) - reviewed labs and d/w patient and her daughter present. Have again explained that plan is to complete planned chemotherapy with weekly paclitaxel x12 doses IV along with Herceptin x52 weeks course IV. Recent 2DEcho reports low normal LVEF of 55-60%. Will proceed with next dose of week Herceptin and Taxol iv. Repeat CBC at 1 week and pursue next dose. At 2 weeks will switch to q 3 weekly Herceptin dosing 6 mg/kg IV. Next MD f/u at 5 weeks with labs and plan further treatment.   2. MALT lymphoma right lung, stage IVE.  Right upper lung large cavity abnormality on PET/CT scan of 10/16/13 - bronchoscopy/bx on 10/30/13 reports: RIGHT UPPER LOBE BIOPSY POSITIVE FOR MALIGNANT CELLS. EXTRANODAL MARIGNAL ZONE LYMPHOMA OF MUCOSA-ASSOCIATED LYMPHOID TISSUE (MALT LYMPHOMA)- patient had a CT scan on May 08, 2014, has been reviewed independently.  There is no evidence of lymphadenopathy or any metastatic disease. Right lung abnormality is stable. 3. Anemia - mild, no progressive symptoms. Likely from chemotherapy. Monitor.     4. In between visits, the patient has been advised to call or come to the ER in case of fevers, bleeding, acute sickness or new symptoms. Patient agreeable to this plan.   Leia Alf,  MD   07/08/2014 3:08 PM

## 2014-07-21 ENCOUNTER — Ambulatory Visit: Payer: Medicare HMO | Attending: General Surgery | Admitting: Occupational Therapy

## 2014-07-21 DIAGNOSIS — I890 Lymphedema, not elsewhere classified: Secondary | ICD-10-CM | POA: Diagnosis present

## 2014-07-21 NOTE — Patient Instructions (Signed)
Review again with pt donning and wearing of daysleeve and glove - pt had it no not all the way - and hand was swollen  Because of turniquet at wrist from daysleeve   And then verbally reviewed night sleeve donning and wearing  Written instructions provided and pt to bring in both sleeve early next week

## 2014-07-21 NOTE — Therapy (Signed)
[place] [facility] [address] [place], [address] Phone: [phone]   Fax:  [phone]  Occupational Therapy Treatment  Patient Details  Name: Andrea Rodgers MRN: [id number] Date of Birth: January 18, 1941 Referring Provider:  [name], MD  Encounter Date: 07/21/2014      OT End of Session - 07/21/14 1034    Visit Number 11   Number of Visits 18   Date for OT Re-Evaluation 07/28/14   Authorization Type Medicare    OT Start Time 0936   OT Stop Time 1008   OT Time Calculation (min) 32 min   Activity Tolerance Patient tolerated treatment well   Behavior During Therapy [facility] for tasks assessed/performed      Past Medical History  Diagnosis Date  . Hypertension   . Hyperlipidemia   . Murmur   . Hyperlipemia   . Osteoporosis   . 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 2 cm axillary node. One of 11 nodes positive on post-adjuvant chemotherapy axillary dissection. 3+ centimeter tumor.    Past Surgical History  Procedure Laterality Date  . Abdominal hysterectomy    . Appendectomy    . Mastectomy Left 02-25-14    Dr Bary Castilla  . Breast surgery Left 02/25/14    Modified radical mastectomy, T2 N1. Grade 3, ER, PR negative, HER-2/neu 3+.    There were no vitals filed for this visit.  Visit Diagnosis:  Lymphedema of upper extremity      Subjective Assessment - 07/21/14 0936    Subjective  That night sleeve hurts my chest side at night time - tender and then this sleeve hurts me here at the top band on my upper arm -my husband helps me   Patient Stated Goals prevent my arm from swelling up again    Currently in Pain? No/denies             LYMPHEDEMA/ONCOLOGY QUESTIONNAIRE - 07/21/14 0943    Left Upper Extremity Lymphedema   15 cm Proximal to Olecranon Process 34 cm   10 cm Proximal to Olecranon Process 28.2 cm   Olecranon  Process 26.1 cm   15 cm Proximal to Ulnar Styloid Process 26 cm   10 cm Proximal to Ulnar Styloid Process 23 cm   Just Proximal to Ulnar Styloid Process 16.5 cm   Across Hand at [employer] 20.2 cm   At [place] of 2nd Digit 6.7 cm   At [facility] of Thumb 6.6 cm                 OT Treatments/Exercises (OP) - 07/21/14 0001    ADLs   ADL Comments Assess fit of daysleeve - pt arrive with sleeve not pulled up - oval of elbow at forearm , to much sleeve at wrist causing turniquet and upper band cutting into mid upper arm - ed pt on donning correctly, fitting correctly and what each garment does - also verbally reviewed night time fit and wearing    Manual Therapy   Manual therapy comments Assess circumference and compare to previous measurments                 OT Education - 07/21/14 1034    Education provided Yes   Education Details wearing of compression sleeves   Person(s) Educated Patient   Methods Explanation;Demonstration;Tactile cues;Verbal cues;Handout   Comprehension Verbalized understanding;Returned demonstration;Verbal cues required;Tactile  cues required;Need further instruction          OT Short Term Goals - 07/07/14 1547    OT SHORT TERM GOAL #2   Title Pt and family be independent with skin care , exercises and home program    Status Achieved           OT Long Term Goals - 07/21/14 1037    OT LONG TERM GOAL #1   Title Pt and family will be I in wearing compression garments and  glove for day/night time to maintain L UE circumference    Time 2   Status On-going               Plan - 07/21/14 1035    Clinical Impression Statement Pt's measurements increase at hand , wrist and forearm - as well as upper arm - pt did not had daysleeve on correctly - reviewed again donning and wearing - as well as verbally night sleeve - pt to follow up next week again and bring nighsleeve too that I can review with her and husband     Pt will benefit from  skilled therapeutic intervention in order to improve on the following deficits (Retired) Increased edema   Rehab Potential Good   OT Frequency 1x / week   OT Duration 2 weeks   OT Home Exercise Plan see pt instruction    Consulted and Agree with Plan of Care Patient        Problem List Patient Active Problem List   Diagnosis Date Noted  . Lymphedema of arm 05/30/2014  . Breast cancer, stage 2 10/08/2013    Rosalyn Gess OTR/L, CLT 07/21/2014, 10:38 AM  [place] [facility] [address], [place], [address] Phone: [phone]   Fax:  [phone]

## 2014-07-22 ENCOUNTER — Inpatient Hospital Stay (HOSPITAL_BASED_OUTPATIENT_CLINIC_OR_DEPARTMENT_OTHER): Payer: Medicare HMO | Admitting: Internal Medicine

## 2014-07-22 ENCOUNTER — Other Ambulatory Visit: Payer: Self-pay | Admitting: Internal Medicine

## 2014-07-22 ENCOUNTER — Inpatient Hospital Stay: Payer: Medicare HMO | Attending: Internal Medicine

## 2014-07-22 ENCOUNTER — Inpatient Hospital Stay: Payer: Medicare HMO

## 2014-07-22 VITALS — BP 185/78 | HR 83 | Temp 97.1°F | Resp 18 | Ht <= 58 in | Wt 132.7 lb

## 2014-07-22 DIAGNOSIS — Z79899 Other long term (current) drug therapy: Secondary | ICD-10-CM | POA: Diagnosis not present

## 2014-07-22 DIAGNOSIS — I10 Essential (primary) hypertension: Secondary | ICD-10-CM | POA: Diagnosis not present

## 2014-07-22 DIAGNOSIS — E785 Hyperlipidemia, unspecified: Secondary | ICD-10-CM

## 2014-07-22 DIAGNOSIS — R5383 Other fatigue: Secondary | ICD-10-CM | POA: Diagnosis not present

## 2014-07-22 DIAGNOSIS — D649 Anemia, unspecified: Secondary | ICD-10-CM | POA: Diagnosis not present

## 2014-07-22 DIAGNOSIS — Z90710 Acquired absence of both cervix and uterus: Secondary | ICD-10-CM | POA: Insufficient documentation

## 2014-07-22 DIAGNOSIS — Z9013 Acquired absence of bilateral breasts and nipples: Secondary | ICD-10-CM | POA: Insufficient documentation

## 2014-07-22 DIAGNOSIS — Z171 Estrogen receptor negative status [ER-]: Secondary | ICD-10-CM | POA: Diagnosis not present

## 2014-07-22 DIAGNOSIS — Z7982 Long term (current) use of aspirin: Secondary | ICD-10-CM

## 2014-07-22 DIAGNOSIS — Z5111 Encounter for antineoplastic chemotherapy: Secondary | ICD-10-CM | POA: Insufficient documentation

## 2014-07-22 DIAGNOSIS — M818 Other osteoporosis without current pathological fracture: Secondary | ICD-10-CM

## 2014-07-22 DIAGNOSIS — C50919 Malignant neoplasm of unspecified site of unspecified female breast: Secondary | ICD-10-CM

## 2014-07-22 DIAGNOSIS — Z9049 Acquired absence of other specified parts of digestive tract: Secondary | ICD-10-CM | POA: Diagnosis not present

## 2014-07-22 DIAGNOSIS — C50512 Malignant neoplasm of lower-outer quadrant of left female breast: Secondary | ICD-10-CM

## 2014-07-22 DIAGNOSIS — C884 Extranodal marginal zone B-cell lymphoma of mucosa-associated lymphoid tissue [MALT-lymphoma]: Secondary | ICD-10-CM | POA: Diagnosis not present

## 2014-07-22 DIAGNOSIS — C50912 Malignant neoplasm of unspecified site of left female breast: Secondary | ICD-10-CM

## 2014-07-22 DIAGNOSIS — C779 Secondary and unspecified malignant neoplasm of lymph node, unspecified: Secondary | ICD-10-CM | POA: Diagnosis not present

## 2014-07-22 LAB — HEPATIC FUNCTION PANEL
ALK PHOS: 57 U/L (ref 38–126)
ALT: 13 U/L — ABNORMAL LOW (ref 14–54)
AST: 23 U/L (ref 15–41)
Albumin: 3.9 g/dL (ref 3.5–5.0)
Bilirubin, Direct: <0.1 mg/dL — ABNORMAL LOW (ref 0.1–0.5)
Total Bilirubin: 0.4 mg/dL (ref 0.3–1.2)
Total Protein: 6.8 g/dL (ref 6.5–8.1)

## 2014-07-22 LAB — CBC WITH DIFFERENTIAL/PLATELET
BASOS PCT: 1 %
Basophils Absolute: 0 10*3/uL (ref 0–0.1)
Eosinophils Absolute: 0.1 10*3/uL (ref 0–0.7)
Eosinophils Relative: 2 %
HCT: 34.9 % — ABNORMAL LOW (ref 35.0–47.0)
HEMOGLOBIN: 11.3 g/dL — AB (ref 12.0–16.0)
Lymphocytes Relative: 29 %
Lymphs Abs: 1.2 10*3/uL (ref 1.0–3.6)
MCH: 30.7 pg (ref 26.0–34.0)
MCHC: 32.5 g/dL (ref 32.0–36.0)
MCV: 94.6 fL (ref 80.0–100.0)
MONO ABS: 0.9 10*3/uL (ref 0.2–0.9)
Monocytes Relative: 20 %
Neutro Abs: 2.1 10*3/uL (ref 1.4–6.5)
Neutrophils Relative %: 48 %
Platelets: 220 10*3/uL (ref 150–440)
RBC: 3.69 MIL/uL — AB (ref 3.80–5.20)
RDW: 15.9 % — ABNORMAL HIGH (ref 11.5–14.5)
WBC: 4.2 10*3/uL (ref 3.6–11.0)

## 2014-07-22 LAB — BASIC METABOLIC PANEL
ANION GAP: 11 (ref 5–15)
BUN: 14 mg/dL (ref 6–20)
CALCIUM: 8.9 mg/dL (ref 8.9–10.3)
CO2: 28 mmol/L (ref 22–32)
Chloride: 102 mmol/L (ref 101–111)
Creatinine, Ser: 0.78 mg/dL (ref 0.44–1.00)
Glucose, Bld: 97 mg/dL (ref 65–99)
Potassium: 3.4 mmol/L — ABNORMAL LOW (ref 3.5–5.1)
SODIUM: 141 mmol/L (ref 135–145)

## 2014-07-22 MED ORDER — ACETAMINOPHEN 325 MG PO TABS
650.0000 mg | ORAL_TABLET | Freq: Once | ORAL | Status: AC
  Administered 2014-07-22: 650 mg via ORAL
  Filled 2014-07-22: qty 2

## 2014-07-22 MED ORDER — HEPARIN SOD (PORK) LOCK FLUSH 100 UNIT/ML IV SOLN
INTRAVENOUS | Status: AC
  Filled 2014-07-22: qty 5

## 2014-07-22 MED ORDER — POTASSIUM CHLORIDE CRYS ER 20 MEQ PO TBCR: 10.0000 meq | EXTENDED_RELEASE_TABLET | Freq: Every day | ORAL | Status: DC

## 2014-07-22 MED ORDER — SODIUM CHLORIDE 0.9 % IV SOLN
Freq: Once | INTRAVENOUS | Status: AC
Start: 2014-07-22 — End: 2014-07-22
  Administered 2014-07-22: 12:00:00 via INTRAVENOUS
  Filled 2014-07-22: qty 1000

## 2014-07-22 MED ORDER — DIPHENHYDRAMINE HCL 25 MG PO CAPS
25.0000 mg | ORAL_CAPSULE | Freq: Once | ORAL | Status: AC
  Administered 2014-07-22: 25 mg via ORAL
  Filled 2014-07-22: qty 1

## 2014-07-22 MED ORDER — TRASTUZUMAB CHEMO INJECTION 440 MG
6.0000 mg/kg | Freq: Once | INTRAVENOUS | Status: AC
  Administered 2014-07-22: 357 mg via INTRAVENOUS
  Filled 2014-07-22: qty 17

## 2014-07-22 MED ORDER — HEPARIN SOD (PORK) LOCK FLUSH 100 UNIT/ML IV SOLN
500.0000 [IU] | Freq: Once | INTRAVENOUS | Status: AC | PRN
  Administered 2014-07-22: 500 [IU]

## 2014-07-24 ENCOUNTER — Ambulatory Visit
Admission: RE | Admit: 2014-07-24 | Discharge: 2014-07-24 | Disposition: A | Discharge status: Home / Self Care | Payer: Medicare HMO | Source: Ambulatory Visit | Attending: Internal Medicine | Admitting: Internal Medicine

## 2014-07-24 DIAGNOSIS — Z79899 Other long term (current) drug therapy: Secondary | ICD-10-CM | POA: Diagnosis not present

## 2014-07-24 DIAGNOSIS — C50912 Malignant neoplasm of unspecified site of left female breast: Secondary | ICD-10-CM | POA: Diagnosis present

## 2014-07-24 MED ORDER — TECHNETIUM TC 99M-LABELED RED BLOOD CELLS IV KIT
20.0000 | PACK | Freq: Once | INTRAVENOUS | Status: AC | PRN
  Administered 2014-07-24: 21.72 via INTRAVENOUS

## 2014-07-29 ENCOUNTER — Ambulatory Visit: Payer: Medicare HMO | Admitting: Occupational Therapy

## 2014-07-29 NOTE — Progress Notes (Signed)
[place]  [phone] Fax:([phone]) [phone]     ID: [name] OB: 1940-06-29  MR#: [id number]  IFO#:[id number]  Patient Care Team: Albina Billet, MD as PCP - General (Internal Medicine) [name], MD as Consulting Physician (General Surgery) Albina Billet, MD (Internal Medicine)  CHIEF COMPLAINT/DIAGNOSIS:  1. 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  (ER -, PR weak + at 1-10%, Her-2/neu positive at 3+ on IHC). 10/16/13 - PET scan. IMPRESSION:  2.8 cm soft tissue lesion in the lower outer left breast, corresponding to known primary bronchogenic neoplasm. Associated 11 mm short axis left axillary nodal metastasis. No evidence of distant metastases. Mildly cystic cavitary lesion with surrounding opacity and bronchial wall thickening in the right upper lobe, as described above. Got neoadjuvant chemotherapy with Adriamycin/Cytoxan x 4  (given concurrent large lung lesion being lymphoma) from 11/10/13 - 01/12/14. Minimal response of breast tumor. Underwent left modified radical mastectomy on 02/25/14. Surgical pathology report.  A.LEFT BREAST AND AXILLARY LYMPH NODES; MODIFIED RADICAL MASTECTOMY: - INVASIVE MAMMARY CARCINOMA OF NO SPECIAL TYPE, 3.2 CM, GRADE 3. - MARGINS UNINVOLVED. - METASTATIC CARCINOMA IN ONE OF 11 AXILLARY LYMPH NODES (1/11).  2. MALT lymphoma right lung, stage IVE.  Right upper lung large cavity abnormality on PET/CT scan of 10/16/13 - bronchoscopy/bx on 10/30/13 reports:RIGHT UPPER LOBE BIOPSY POSITIVE FOR MALIGNANT CELLS. EXTRANODAL MARIGNAL ZONE LYMPHOMA OF MUCOSA-ASSOCIATED LYMPHOID TISSUE (MALT LYMPHOMA). SEE COMMENT.     HISTORY OF PRESENT ILLNESS:  Patient returns for continued oncology follow-up  and plan next dose of treatment with Herceptin. States that she is doing steady and denies any new complaints. Continues to eat well. She is s/p modified radical mastectomy. States that chronic fatigue on exertion is unchanged, remains physically active. No new leg swelling. No new paresthesias in the extremities. No new dyspnea, chest pain, or hemoptysis. No new mood disturbances. No fevers. No pain issues, 0/10.  REVIEW OF SYSTEMS:   ROS As in HPI above. In addition, no fever, chills or sweats. No new headaches or focal weakness.  No new mood disturbances. No  sore throat, cough, shortness of breath, sputum, hemoptysis or chest pain. No dizziness or palpitation. No abdominal pain, constipation, diarrhea, dysuria or hematuria. No new skin rash or bleeding symptoms. No new paresthesias in extremities. PS ECOG 1.   PAST MEDICAL HISTORY: Past Medical History  Diagnosis Date  . Hypertension   . Hyperlipidemia   . Murmur   . Hyperlipemia   . Osteoporosis   . 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 2 cm axillary node. One of 11 nodes positive on post-adjuvant chemotherapy axillary dissection. 3+ centimeter tumor.          Hypertension Murmur Hyperlipidemia Osteoporosis Abdominal hysterectomy Appendectomy     Gyn hx: G3P3. She is postmenopausal.  PAST SURGICAL HISTORY: Past Surgical History  Procedure Laterality Date  . Abdominal hysterectomy    . Appendectomy    . Mastectomy Left 02-25-14    Dr Bary Castilla  . Breast surgery Left 02/25/14    Modified radical mastectomy, T2 N1. Grade 3, ER, PR negative, HER-2/neu 3+.    FAMILY HISTORY - remarkable for hypertension. Denies malignancy or hematologic  disorders.  SOCIAL HISTORY: History  Substance Use Topics  . Smoking status: Never Smoker   . Smokeless tobacco: Never Used  . Alcohol Use: No  Denies smoking or alcohol  usage. Physically active and ambulatory. Lives with her husband.  No Known Allergies  Current Outpatient Prescriptions  Medication Sig Dispense Refill  . alendronate (FOSAMAX) 70 MG tablet Take 70 mg by mouth once a week. Take with a full glass of water on an empty stomach.    Marland Kitchen amLODipine (NORVASC) 2.5 MG tablet Take 2.5 mg by mouth daily.    Marland Kitchen aspirin 81 MG tablet Take 81 mg by mouth daily.    . cholecalciferol (VITAMIN D) 1000 UNITS tablet Take 1,000 Units by mouth daily.    Marland Kitchen losartan-hydrochlorothiazide (HYZAAR) 100-25 MG per tablet Take 1 tablet by mouth daily.    . Multiple Vitamins-Minerals (MULTIVITAMIN WITH MINERALS) tablet Take 1 tablet by mouth daily.    . promethazine (PHENERGAN) 25 MG tablet Take 25 mg by mouth every 6 (six) hours as needed for nausea or vomiting.    . simvastatin (ZOCOR) 40 MG tablet Take 40 mg by mouth at bedtime.    . Terbinafine HCl 1 % SOLN Apply 1-2 sprays topically 2 (two) times daily. Apply to area below both breasts twice daily 1 Bottle 0  . potassium chloride SA (K-DUR,KLOR-CON) 20 MEQ tablet Take 0.5 tablets (10 mEq total) by mouth daily. 6 tablet 0   No current facility-administered medications for this visit.   Facility-Administered Medications Ordered in Other Visits  Medication Dose Route Frequency Provider Last Rate Last Dose  . sodium chloride 0.9 % injection 10 mL  10 mL Intracatheter PRN Leia Alf, MD   10 mL at 06/17/14 0930    OBJECTIVE: Filed Vitals:   07/22/14 1045  BP: 185/78  Pulse: 83  Temp: 97.1 F (36.2 C)  Resp: 18     Body mass index is 27.75 kg/(m^2).    ECOG FS:2 - Symptomatic, <50% confined to bed  GENERAL: Alert and oriented and in no acute distress. No icterus. HEENT: EOMs intact. Oral exam negative for thrush or lesions.   CVS: S1S2, regular LUNGS: Bilaterally clear to auscultation, no rhonchi. ABDOMEN: Soft, nontender. No hepatosplenomegaly clinically.  NEURO: grossly nonfocal, cranial nerves are intact.  Gait unremarkable. EXTREMITIES: No pedal edema.   LAB RESULTS:    Component Value Date/Time   NA 141 07/22/2014 1027   NA 136 05/27/2014 0942   NA 142 10/08/2013 1631   K 3.4* 07/22/2014 1027   K 3.4* 05/27/2014 0942   CL 102 07/22/2014 1027   CL 102 05/27/2014 0942   CO2 28 07/22/2014 1027   CO2 29 05/27/2014 0942   GLUCOSE 97 07/22/2014 1027   GLUCOSE 106* 05/27/2014 0942   GLUCOSE 110* 10/08/2013 1631   BUN 14 07/22/2014 1027   BUN 13 05/27/2014 0942   BUN 10 10/08/2013 1631   CREATININE 0.78 07/22/2014 1027   CREATININE 0.58 05/27/2014 0942   CALCIUM 8.9 07/22/2014 1027   CALCIUM 8.6* 05/27/2014 0942   PROT 6.8 07/22/2014 1027   PROT 6.8 05/27/2014 0942   PROT 7.9 10/08/2013 1631   ALBUMIN 3.9 07/22/2014 1027   ALBUMIN 3.8 05/27/2014 0942   AST 23 07/22/2014 1027   AST 18 05/27/2014 0942   ALT 13* 07/22/2014 1027   ALT 14 05/27/2014 0942   ALKPHOS 57 07/22/2014 1027   ALKPHOS 69 05/27/2014 0942   BILITOT 0.4 07/22/2014 1027   GFRNONAA >60 07/22/2014  [place] 05/27/2014 0942   GFRAA >60 07/22/2014 1027   GFRAA >60 05/27/2014 0942       Chemistry         Lab Results  Component Value Date   LABCA2 63.6* 10/08/2013   STUDIES:   ASSESSMENT / PLAN:   1. 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 10/16/13 - PET scan. IMPRESSION:  2.8 cm soft tissue lesion in the lower outer left breast, corresponding to known primary bronchogenic neoplasm. Associated 11 mm short axis left axillary nodal metastasis. No evidence of distant metastases. Mildly cystic cavitary lesion with surrounding opacity and bronchial wall thickening in the right upper lobe, as described above. Got neoadjuvant chemotherapy with Adriamycin/Cytoxan x 4  (given concurrent large lung lesion being lymphoma) from 11/10/13 - 01/12/14. Minimal response of breast  tumor. Underwent left modified radical mastectomy on 02/25/14 (Surgical pathology report. INVASIVE MAMMARY CARCINOMA OF NO SPECIAL TYPE, 3.2 CM, GRADE 3. MARGINS UNINVOLVED. METASTATIC CARCINOMA IN ONE OF 11 AXILLARY LYMPH NODES) - reviewed labs and d/w patient and her daughter present. Have again explained that plan is to complete Herceptin x52 weeks course. Last 2DEcho reports low normal LVEF of 55-60%. Will proceed with next dose of q 3 weekly Herceptin x 4 doses. Next lab at 9 weeks. Next MD f/u at 12 weeks and plan further treatment. Will get surveillance MUGA scan. Get Right mammogram on 10/08/14 2. MALT lymphoma right lung, stage IVE.  Right upper lung large cavity abnormality on PET/CT scan of 10/16/13 - bronchoscopy/bx on 10/30/13 reports: RIGHT UPPER LOBE BIOPSY POSITIVE FOR MALIGNANT CELLS. EXTRANODAL MARIGNAL ZONE LYMPHOMA OF MUCOSA-ASSOCIATED LYMPHOID TISSUE (MALT LYMPHOMA)- patient had a CT scan on May 08, 2014, has been reviewed independently.  There is no evidence of lymphadenopathy or any metastatic disease. Right lung abnormality is stable. Next CT chest on 10/08/14 3. Anemia - mild, no progressive symptoms. Likely from chemotherapy. Monitor.     4. In between visits, the patient has been advised to call or come to the ER in case of fevers, bleeding, acute sickness or new symptoms. Patient agreeable to this plan.   Leia Alf, MD   07/29/2014 12:10 PM

## 2014-08-03 ENCOUNTER — Ambulatory Visit: Payer: Medicare HMO | Admitting: Occupational Therapy

## 2014-08-03 DIAGNOSIS — I890 Lymphedema, not elsewhere classified: Secondary | ICD-10-CM

## 2014-08-03 NOTE — Therapy (Signed)
[facility] [address] [place], [address] Phone: [phone]   Fax:  [phone]  Occupational Therapy Treatment  Patient Details  Name: Andrea Rodgers MRN: [id number] Date of Birth: 1940-11-01 Referring Provider:  [name], MD  Encounter Date: 08/03/2014      OT End of Session - 08/03/14 1005    Visit Number 12   Number of Visits 18   Date for OT Re-Evaluation 08/24/14   Authorization Type Medicare    OT Start Time 0930   OT Stop Time 0959   OT Time Calculation (min) 29 min   Activity Tolerance Patient tolerated treatment well   Behavior During Therapy [facility] for tasks assessed/performed      Past Medical History  Diagnosis Date  . Hypertension   . Hyperlipidemia   . Murmur   . Hyperlipemia   . Osteoporosis   . 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 2 cm axillary node. One of 11 nodes positive on post-adjuvant chemotherapy axillary dissection. 3+ centimeter tumor.    Past Surgical History  Procedure Laterality Date  . Abdominal hysterectomy    . Appendectomy    . Mastectomy Left 02-25-14    Dr Bary Castilla  . Breast surgery Left 02/25/14    Modified radical mastectomy, T2 N1. Grade 3, ER, PR negative, HER-2/neu 3+.    There were no vitals filed for this visit.  Visit Diagnosis:  Lymphedema of upper extremity - Plan: Ot plan of care cert/re-cert      Subjective Assessment - 08/03/14 0955    Subjective  Do I have to wear the night sleeve every night - I kept the sleeve off for about hour this am - my night sleeve do not rubb me under my arm anymore - that is better - my husband helps me    Patient is accompained by: Family member   Patient Stated Goals prevent my arm from swelling up again    Currently in Pain? No/denies             LYMPHEDEMA/ONCOLOGY QUESTIONNAIRE - 08/03/14 0954    Left Upper  Extremity Lymphedema   15 cm Proximal to Olecranon Process 31 cm   10 cm Proximal to Olecranon Process 29.5 cm   Olecranon Process 25.8 cm   15 cm Proximal to Ulnar Styloid Process 26.7 cm   10 cm Proximal to Ulnar Styloid Process 23 cm   Just Proximal to Ulnar Styloid Process 17 cm   Across Hand at [employer] 20 cm   At [place] of 2nd Digit 6.6 cm   At [facility] of Thumb 6.6 cm                 OT Treatments/Exercises (OP) - 08/03/14 0001    ADLs   ADL Comments Assess fitting of night sleeve and education of pt and husband about donning and doffing - also to wear isotoner glove underneath because of hand edema increased - pt also to wear every night and if at home can wear it longer to decrease measusements when just watching tv or reading - also assess daysleeve - fits well and ed pt and husband about donning and doffing as well as correct fit    Manual Therapy   Manual therapy comments Assess circumference and compare to previous measurments  OT Education - 08-10-2014 1005    Education provided Yes   Education Details wearing of compression    Person(s) Educated Patient;Spouse   Methods Explanation;Demonstration;Verbal cues   Comprehension Verbal cues required;Returned demonstration;Verbalized understanding          OT Short Term Goals - 2014/08/10 1011    OT SHORT TERM GOAL #1   Title L UE circumference decrease by 1 cm from hand to elbow and 2 cm uppper arm to be fitted for appropriate compression garments    Status Achieved   OT SHORT TERM GOAL #2   Title Pt and family be independent with skin care , exercises and home program    Status Achieved           OT Long Term Goals - 2014/08/10 1011    OT LONG TERM GOAL #1   Title Pt and family will be I in wearing compression garments and  glove for day/night time to maintain L UE circumference    Time 2   Period Weeks   Status On-going               Plan - 08-10-14 1007    Clinical  Impression Statement Pt's measurements increased at wrist , forearm and mid upper arm - husband report pt did not wear it for night or 2 - pt asked if she needs to wear every night time - explain for pt and husband in length importance of sleeves and what each sleeves job or benefit s are - pt  circumference needs to decrease again and need to wear night sleeve longer when at home - pt to return in 2 wks for remeasure - husband and pt show and verbalize understanding    Pt will benefit from skilled therapeutic intervention in order to improve on the following deficits (Retired) Increased edema   Rehab Potential Good   OT Frequency 1x / week   OT Duration 2 weeks   OT Treatment/Interventions Self-care/ADL training;Manual lymph drainage;DME and/or AE instruction   OT Home Exercise Plan see pt instruction    Consulted and Agree with Plan of Care Patient;Family member/caregiver          G-Codes - 2014-08-10 1011    Functional Assessment Tool Used lymphedema circumference,  ROM , clincial judgement    Functional Limitation Self care   Self Care Current Status (X2712) At least 20 percent but less than 40 percent impaired, limited or restricted   Self Care Goal Status (J2909) At least 1 percent but less than 20 percent impaired, limited or restricted      Problem List Patient Active Problem List   Diagnosis Date Noted  . Lymphedema of arm 05/30/2014  . Breast cancer, stage 2 10/08/2013    Rosalyn Gess OTR/L,CLT 08-10-14, 10:14 AM  [facility] [address] [place], [address] Phone: [phone]   Fax:  [phone]

## 2014-08-03 NOTE — Patient Instructions (Signed)
Husband and pt educated on correct wearing of night time and daytime compression - Also can wear night time sleeve longer during day if reading or watching tv   Need to wear every night and day - can re evaluate when she stops chemo

## 2014-08-12 ENCOUNTER — Inpatient Hospital Stay: Payer: Medicare HMO

## 2014-08-12 VITALS — BP 137/72 | HR 74 | Temp 97.2°F

## 2014-08-12 DIAGNOSIS — C50919 Malignant neoplasm of unspecified site of unspecified female breast: Secondary | ICD-10-CM

## 2014-08-12 DIAGNOSIS — Z5111 Encounter for antineoplastic chemotherapy: Secondary | ICD-10-CM | POA: Diagnosis not present

## 2014-08-12 MED ORDER — DIPHENHYDRAMINE HCL 25 MG PO CAPS
25.0000 mg | ORAL_CAPSULE | Freq: Once | ORAL | Status: AC
  Administered 2014-08-12: 25 mg via ORAL
  Filled 2014-08-12: qty 1

## 2014-08-12 MED ORDER — TRASTUZUMAB CHEMO INJECTION 440 MG
6.0000 mg/kg | Freq: Once | INTRAVENOUS | Status: AC
  Administered 2014-08-12: 357 mg via INTRAVENOUS
  Filled 2014-08-12: qty 17

## 2014-08-12 MED ORDER — HEPARIN SOD (PORK) LOCK FLUSH 100 UNIT/ML IV SOLN
500.0000 [IU] | Freq: Once | INTRAVENOUS | Status: AC
  Administered 2014-08-12: 500 [IU] via INTRAVENOUS

## 2014-08-12 MED ORDER — SODIUM CHLORIDE 0.9 % IV SOLN
Freq: Once | INTRAVENOUS | Status: AC
  Administered 2014-08-12: 14:00:00 via INTRAVENOUS
  Filled 2014-08-12: qty 1000

## 2014-08-12 MED ORDER — SODIUM CHLORIDE 0.9 % IJ SOLN
10.0000 mL | INTRAMUSCULAR | Status: DC | PRN
  Administered 2014-08-12: 10 mL
  Filled 2014-08-12: qty 10

## 2014-08-12 MED ORDER — ACETAMINOPHEN 325 MG PO TABS
650.0000 mg | ORAL_TABLET | Freq: Once | ORAL | Status: AC
  Administered 2014-08-12: 650 mg via ORAL
  Filled 2014-08-12: qty 2

## 2014-08-13 ENCOUNTER — Encounter: Payer: Self-pay | Admitting: General Surgery

## 2014-08-13 ENCOUNTER — Ambulatory Visit (INDEPENDENT_AMBULATORY_CARE_PROVIDER_SITE_OTHER): Payer: Medicare HMO | Admitting: General Surgery

## 2014-08-13 VITALS — BP 142/78 | HR 70 | Resp 14 | Ht 60.0 in | Wt 134.0 lb

## 2014-08-13 DIAGNOSIS — C50912 Malignant neoplasm of unspecified site of left female breast: Secondary | ICD-10-CM | POA: Diagnosis not present

## 2014-08-13 NOTE — Patient Instructions (Signed)
The patient is aware to call back for any questions or concerns.  

## 2014-08-13 NOTE — Progress Notes (Signed)
Patient ID: Andrea Rodgers, female   DOB: 1940/03/20, 74 y.o.   MRN: [id number]  Chief Complaint  Patient presents with  . Follow-up    breast cancer    HPI Andrea Rodgers is a 74 y.o. female here todty for her follow up breast cancer check. Patient states she is doing well. She is wearing her lymphedema sleeves and states it seems to be helping. She goes every three weeks to [facility] for Herceptin.  HPI  Past Medical History  Diagnosis Date  . Hypertension   . Hyperlipidemia   . Murmur   . Hyperlipemia   . Osteoporosis   . 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 2 cm axillary node. One of 11 nodes positive on post-adjuvant chemotherapy axillary dissection. 3+ centimeter tumor.    Past Surgical History  Procedure Laterality Date  . Abdominal hysterectomy    . Appendectomy    . Mastectomy Left 02-25-14    Dr Lemar Livings  . Breast surgery Left 02/25/14    Modified radical mastectomy, T2 N1. Grade 3, ER, PR negative, HER-2/neu 3+.    No family history on file.  Social History History  Substance Use Topics  . Smoking status: Never Smoker   . Smokeless tobacco: Never Used  . Alcohol Use: No    No Known Allergies  Current Outpatient Prescriptions  Medication Sig Dispense Refill  . alendronate (FOSAMAX) 70 MG tablet Take 70 mg by mouth once a week. Take with a full glass of water on an empty stomach.    Marland Kitchen amLODipine (NORVASC) 2.5 MG tablet Take 2.5 mg by mouth daily.    Marland Kitchen aspirin 81 MG tablet Take 81 mg by mouth daily.    . cholecalciferol (VITAMIN D) 1000 UNITS tablet Take 1,000 Units by mouth daily.    Marland Kitchen losartan-hydrochlorothiazide (HYZAAR) 100-25 MG per tablet Take 1 tablet by mouth daily.    . Multiple Vitamins-Minerals (MULTIVITAMIN WITH MINERALS) tablet Take 1 tablet by mouth daily.    . simvastatin (ZOCOR) 40 MG tablet Take 40 mg by mouth at bedtime.     No current facility-administered  medications for this visit.   Facility-Administered Medications Ordered in Other Visits  Medication Dose Route Frequency Provider Last Rate Last Dose  . sodium chloride 0.9 % injection 10 mL  10 mL Intracatheter PRN Janese Banks, MD   10 mL at 06/17/14 0930  . sodium chloride 0.9 % injection 10 mL  10 mL Intracatheter PRN Janese Banks, MD   10 mL at 08/12/14 1357    Review of Systems Review of Systems  Constitutional: Negative.   Respiratory: Negative.   Cardiovascular: Negative.     Blood pressure 142/78, pulse 70, resp. rate 14, height 5' (1.524 m), weight 134 lb (60.782 kg).  Physical Exam Physical Exam  Constitutional: She is oriented to person, place, and time. She appears well-developed and well-nourished.  HENT:  Mouth/Throat: Oropharynx is clear and moist.  Eyes: Conjunctivae are normal. No scleral icterus.  Neck: Neck supple.  Pulmonary/Chest:    Incision well healed. Left arm 34 and right arm is 30.  Musculoskeletal:       Arms: Lymphadenopathy:    She has no cervical adenopathy.  Neurological: She is alert and oriented to person, place, and time.  Skin: Skin is warm and dry.  Psychiatric: She has a normal mood and affect.       Assessment  Doing well status post right modified radical mastectomy. Stable lymphedema.    Plan    We'll plan for follow-up exam in December 2016. By report the patient is going to get a chest CT in August under the care of her medical oncologist, Leia Alf, M.D.    Follow up in December 2016.  PCP:  Benita Stabile  Dr. Charletta Cousin, [place] 08/14/2014, 7:58 PM

## 2014-08-19 ENCOUNTER — Ambulatory Visit: Payer: Medicare HMO | Attending: General Surgery | Admitting: Occupational Therapy

## 2014-08-19 DIAGNOSIS — I890 Lymphedema, not elsewhere classified: Secondary | ICD-10-CM | POA: Insufficient documentation

## 2014-08-19 NOTE — Patient Instructions (Signed)
To wear night time and daytime compression - do not skip   And taught fibrotic tech for massage to forearm - for husband or daughter to do

## 2014-08-19 NOTE — Therapy (Signed)
[facility] [address] [place], [address] Phone: [phone]   Fax:  [phone]  Occupational Therapy Treatment  Patient Details  Name: Andrea Rodgers MRN: [id number] Date of Birth: 1940-06-21 Referring Provider:  [name], MD  Encounter Date: 08/19/2014      OT End of Session - 08/19/14 1021    Visit Number 13   Number of Visits 18   Date for OT Re-Evaluation 08/24/14   Authorization Type Medicare    OT Start Time 0940   OT Stop Time 1011   OT Time Calculation (min) 31 min   Activity Tolerance Patient tolerated treatment well   Behavior During Therapy [facility] for tasks assessed/performed      Past Medical History  Diagnosis Date  . Hypertension   . Hyperlipidemia   . Murmur   . Hyperlipemia   . Osteoporosis   . 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 2 cm axillary node. One of 11 nodes positive on post-adjuvant chemotherapy axillary dissection. 3+ centimeter tumor.    Past Surgical History  Procedure Laterality Date  . Abdominal hysterectomy    . Appendectomy    . Mastectomy Left 02-25-14    Dr Bary Castilla  . Breast surgery Left 02/25/14    Modified radical mastectomy, T2 N1. Grade 3, ER, PR negative, HER-2/neu 3+.    There were no vitals filed for this visit.  Visit Diagnosis:  Lymphedema of upper extremity      Subjective Assessment - 08/19/14 0942    Subjective  My husband helps  me to put on my sleeves and I did wear my night sleeve every night  like you told me last time - having chemo every 3 wks - and scan in near future - do not know the date - I do still have that swelling under my arm    Patient Stated Goals prevent my arm from swelling up again    Currently in Pain? No/denies             LYMPHEDEMA/ONCOLOGY QUESTIONNAIRE - 08/19/14 0946    Left Upper Extremity Lymphedema   15 cm Proximal  to Olecranon Process 31.6 cm   10 cm Proximal to Olecranon Process 30.8 cm   Olecranon Process 26.8 cm   15 cm Proximal to Ulnar Styloid Process 27 cm   10 cm Proximal to Ulnar Styloid Process 23.4 cm   Just Proximal to Ulnar Styloid Process 17 cm   Across Hand at [employer] 20 cm   At [place] of 2nd Digit 6.6 cm   At [facility] of Thumb 6.6 cm                 OT Treatments/Exercises (OP) - 08/19/14 0001    Manual Therapy   Manual therapy comments Fibrotic techniques done this date because pt's measurements increase from forearm to upper arm and more thick brawny fibrotic lymph this date in palmar forearm and hand (webspace)                 OT Education - 08/19/14 1021    Education provided Yes   Education Details wearing of sleeve and fibrotic tech    Person(s) Educated Patient   Methods Explanation;Demonstration;Tactile cues;Verbal cues   Comprehension Verbalized understanding;Verbal cues required          OT Short Term Goals - 08/03/14 1011  OT SHORT TERM GOAL #1   Title L UE circumference decrease by 1 cm from hand to elbow and 2 cm uppper arm to be fitted for appropriate compression garments    Status Achieved   OT SHORT TERM GOAL #2   Title Pt and family be independent with skin care , exercises and home program    Status Achieved           OT Long Term Goals - 08/03/14 1011    OT LONG TERM GOAL #1   Title Pt and family will be I in wearing compression garments and  glove for day/night time to maintain L UE circumference    Time 2   Period Weeks   Status On-going               Plan - 08/19/14 1023    Clinical Impression Statement Pt's measurements increased compare to 2 wks ago from forearm to upper arm - more fibrotic lymph in palmar forearm and webspace of hand - pt report she was compliant the last 2 wks and wore compression garments as she was told last time - she was bandage since April 16 and then fitted with compression garments -   she is still receiving chemo -  trunkle lymphedema because of  cost /assistance by funds could not get her thorasic compression garment - would recommnend  compression pump for  L UE and trunk at this time    Pt will benefit from skilled therapeutic intervention in order to improve on the following deficits (Retired) Increased edema   Rehab Potential Good   OT Frequency Other (comment)   OT Duration Other (comment)   OT Treatment/Interventions Self-care/ADL training;Manual lymph drainage;DME and/or AE instruction   Plan pt to make appt after receiving pump and reassess or if any issues increase in mean time    OT Home Exercise Plan see pt instruction    Consulted and Agree with Plan of Care Patient;Family member/caregiver        Problem List Patient Active Problem List   Diagnosis Date Noted  . Lymphedema of arm 05/30/2014  . Breast cancer, stage 2 10/08/2013    Rosalyn Gess OTR/L, CLT 08/19/2014, 10:34 AM  [facility] [address], [place], [address] Phone: [phone]   Fax:  [phone]

## 2014-08-27 ENCOUNTER — Ambulatory Visit: Payer: Medicare HMO | Admitting: Occupational Therapy

## 2014-09-02 ENCOUNTER — Inpatient Hospital Stay: Payer: Medicare HMO | Attending: Internal Medicine

## 2014-09-02 DIAGNOSIS — Z79899 Other long term (current) drug therapy: Secondary | ICD-10-CM | POA: Diagnosis not present

## 2014-09-02 DIAGNOSIS — Z5111 Encounter for antineoplastic chemotherapy: Secondary | ICD-10-CM | POA: Insufficient documentation

## 2014-09-02 DIAGNOSIS — C773 Secondary and unspecified malignant neoplasm of axilla and upper limb lymph nodes: Secondary | ICD-10-CM | POA: Diagnosis not present

## 2014-09-02 DIAGNOSIS — Z171 Estrogen receptor negative status [ER-]: Secondary | ICD-10-CM | POA: Diagnosis not present

## 2014-09-02 DIAGNOSIS — C50512 Malignant neoplasm of lower-outer quadrant of left female breast: Secondary | ICD-10-CM | POA: Diagnosis not present

## 2014-09-02 DIAGNOSIS — C50919 Malignant neoplasm of unspecified site of unspecified female breast: Secondary | ICD-10-CM

## 2014-09-02 MED ORDER — TRASTUZUMAB CHEMO INJECTION 440 MG
6.0000 mg/kg | Freq: Once | INTRAVENOUS | Status: AC
  Administered 2014-09-02: 357 mg via INTRAVENOUS
  Filled 2014-09-02: qty 17

## 2014-09-02 MED ORDER — ACETAMINOPHEN 325 MG PO TABS
650.0000 mg | ORAL_TABLET | Freq: Once | ORAL | Status: AC
  Administered 2014-09-02: 650 mg via ORAL
  Filled 2014-09-02: qty 2

## 2014-09-02 MED ORDER — DIPHENHYDRAMINE HCL 25 MG PO CAPS
25.0000 mg | ORAL_CAPSULE | Freq: Once | ORAL | Status: AC
  Administered 2014-09-02: 25 mg via ORAL
  Filled 2014-09-02: qty 1

## 2014-09-02 MED ORDER — SODIUM CHLORIDE 0.9 % IV SOLN
Freq: Once | INTRAVENOUS | Status: AC
  Administered 2014-09-02: 14:00:00 via INTRAVENOUS
  Filled 2014-09-02: qty 1000

## 2014-09-02 MED ORDER — HEPARIN SOD (PORK) LOCK FLUSH 100 UNIT/ML IV SOLN
500.0000 [IU] | Freq: Once | INTRAVENOUS | Status: AC | PRN
  Administered 2014-09-02: 500 [IU]
  Filled 2014-09-02: qty 5

## 2014-09-02 NOTE — Progress Notes (Signed)
   09/02/14 1100  Clinical Encounter Type  Visited With Patient  Visit Type Initial  Provided pastoral presence and support to the patient in the cancer center.  Was uncertain of her appointment time, but scheduling was able to work it out for her.  [place] [phone]

## 2014-09-23 ENCOUNTER — Inpatient Hospital Stay: Payer: Medicare HMO

## 2014-09-23 ENCOUNTER — Inpatient Hospital Stay: Payer: Medicare HMO | Attending: Internal Medicine

## 2014-09-23 VITALS — BP 138/74 | HR 76 | Temp 97.2°F | Resp 18

## 2014-09-23 DIAGNOSIS — C773 Secondary and unspecified malignant neoplasm of axilla and upper limb lymph nodes: Secondary | ICD-10-CM | POA: Insufficient documentation

## 2014-09-23 DIAGNOSIS — Z171 Estrogen receptor negative status [ER-]: Secondary | ICD-10-CM | POA: Diagnosis not present

## 2014-09-23 DIAGNOSIS — C50512 Malignant neoplasm of lower-outer quadrant of left female breast: Secondary | ICD-10-CM | POA: Diagnosis not present

## 2014-09-23 DIAGNOSIS — Z5111 Encounter for antineoplastic chemotherapy: Secondary | ICD-10-CM | POA: Diagnosis present

## 2014-09-23 DIAGNOSIS — Z79899 Other long term (current) drug therapy: Secondary | ICD-10-CM | POA: Insufficient documentation

## 2014-09-23 DIAGNOSIS — C50919 Malignant neoplasm of unspecified site of unspecified female breast: Secondary | ICD-10-CM

## 2014-09-23 DIAGNOSIS — C50912 Malignant neoplasm of unspecified site of left female breast: Secondary | ICD-10-CM

## 2014-09-23 LAB — CBC WITH DIFFERENTIAL/PLATELET
BASOS ABS: 0 10*3/uL (ref 0–0.1)
Basophils Relative: 0 %
EOS PCT: 1 %
Eosinophils Absolute: 0 10*3/uL (ref 0–0.7)
HCT: 35.5 % (ref 35.0–47.0)
Hemoglobin: 11.9 g/dL — ABNORMAL LOW (ref 12.0–16.0)
LYMPHS PCT: 32 %
Lymphs Abs: 1.5 10*3/uL (ref 1.0–3.6)
MCH: 30.4 pg (ref 26.0–34.0)
MCHC: 33.4 g/dL (ref 32.0–36.0)
MCV: 90.9 fL (ref 80.0–100.0)
Monocytes Absolute: 0.7 10*3/uL (ref 0.2–0.9)
Monocytes Relative: 16 %
NEUTROS ABS: 2.3 10*3/uL (ref 1.4–6.5)
NEUTROS PCT: 51 %
PLATELETS: 246 10*3/uL (ref 150–440)
RBC: 3.91 MIL/uL (ref 3.80–5.20)
RDW: 13.6 % (ref 11.5–14.5)
WBC: 4.6 10*3/uL (ref 3.6–11.0)

## 2014-09-23 LAB — HEPATIC FUNCTION PANEL
ALBUMIN: 4 g/dL (ref 3.5–5.0)
ALK PHOS: 61 U/L (ref 38–126)
ALT: 14 U/L (ref 14–54)
AST: 20 U/L (ref 15–41)
Total Bilirubin: 0.5 mg/dL (ref 0.3–1.2)
Total Protein: 7.1 g/dL (ref 6.5–8.1)

## 2014-09-23 LAB — CREATININE, SERUM
Creatinine, Ser: 0.62 mg/dL (ref 0.44–1.00)
GFR calc Af Amer: >60 mL/min (ref >60)
GFR calc non Af Amer: >60 mL/min (ref >60)

## 2014-09-23 MED ORDER — DIPHENHYDRAMINE HCL 25 MG PO CAPS
25.0000 mg | ORAL_CAPSULE | Freq: Once | ORAL | Status: AC
  Administered 2014-09-23: 25 mg via ORAL
  Filled 2014-09-23: qty 1

## 2014-09-23 MED ORDER — ACETAMINOPHEN 325 MG PO TABS
650.0000 mg | ORAL_TABLET | Freq: Once | ORAL | Status: AC
  Administered 2014-09-23: 650 mg via ORAL
  Filled 2014-09-23: qty 2

## 2014-09-23 MED ORDER — HEPARIN SOD (PORK) LOCK FLUSH 100 UNIT/ML IV SOLN
500.0000 [IU] | Freq: Once | INTRAVENOUS | Status: DC | PRN
  Filled 2014-09-23: qty 5

## 2014-09-23 MED ORDER — SODIUM CHLORIDE 0.9 % IV SOLN
Freq: Once | INTRAVENOUS | Status: AC
  Administered 2014-09-23: 15:00:00 via INTRAVENOUS
  Filled 2014-09-23: qty 1000

## 2014-09-23 MED ORDER — TRASTUZUMAB CHEMO INJECTION 440 MG
6.0000 mg/kg | Freq: Once | INTRAVENOUS | Status: AC
  Administered 2014-09-23: 357 mg via INTRAVENOUS
  Filled 2014-09-23: qty 17

## 2014-09-23 MED ORDER — SODIUM CHLORIDE 0.9 % IJ SOLN
10.0000 mL | INTRAMUSCULAR | Status: DC | PRN
  Filled 2014-09-23: qty 10

## 2014-09-24 LAB — CA 27.29 (SERIAL MONITOR): CA 27.29: 35.6 U/mL (ref 0.0–38.6)

## 2014-09-25 ENCOUNTER — Encounter: Payer: Medicare HMO | Admitting: Occupational Therapy

## 2014-10-01 ENCOUNTER — Ambulatory Visit: Payer: Medicare HMO | Attending: General Surgery | Admitting: Occupational Therapy

## 2014-10-01 DIAGNOSIS — I890 Lymphedema, not elsewhere classified: Secondary | ICD-10-CM | POA: Diagnosis not present

## 2014-10-01 NOTE — Patient Instructions (Signed)
Same for garments before - but need to use pump AM and PM for 45 min to hour  Replace garments for daytime 6-8 months from when she received it

## 2014-10-01 NOTE — Therapy (Signed)
[facility] [address] [place], [address] Phone: [phone]   Fax:  [phone]  Occupational Therapy Treatment and discharge  Patient Details  Name: Andrea Rodgers MRN: [id number] Date of Birth: 01-Feb-1941 Referring Provider:  [name], MD  Encounter Date: 10/01/2014      OT End of Session - 10/01/14 1658    Visit Number 14   Number of Visits 14   Date for OT Re-Evaluation 10/01/14   OT Start Time 1609   OT Stop Time 1633   OT Time Calculation (min) 24 min   Activity Tolerance Patient tolerated treatment well   Behavior During Therapy [facility] for tasks assessed/performed      Past Medical History  Diagnosis Date  . Hypertension   . Hyperlipidemia   . Murmur   . Hyperlipemia   . Osteoporosis   . 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 2 cm axillary node. One of 11 nodes positive on post-adjuvant chemotherapy axillary dissection. 3+ centimeter tumor.    Past Surgical History  Procedure Laterality Date  . Abdominal hysterectomy    . Appendectomy    . Mastectomy Left 02-25-14    Dr Bary Castilla  . Breast surgery Left 02/25/14    Modified radical mastectomy, T2 N1. Grade 3, ER, PR negative, HER-2/neu 3+.    There were no vitals filed for this visit.  Visit Diagnosis:  Lymphedema of upper extremity - Plan: Ot plan of care cert/re-cert      Subjective Assessment - 10/01/14 1653    Subjective  I can put on my sleeve by myself now - the pump I am doing - he said 2 x day for 45 min to hour - but sometimes I do it for 2 hrs - is that okay    Patient Stated Goals prevent my arm from swelling up again    Currently in Pain? No/denies             LYMPHEDEMA/ONCOLOGY QUESTIONNAIRE - 10/01/14 1616    Left Upper Extremity Lymphedema   15 cm Proximal to Olecranon Process 30.8 cm   10 cm Proximal to Olecranon Process  29.5 cm   Olecranon Process 27.3 cm   15 cm Proximal to Ulnar Styloid Process 28 cm   10 cm Proximal to Ulnar Styloid Process 23.5 cm   Just Proximal to Ulnar Styloid Process 16.4 cm   Across Hand at [employer] 20 cm   At [place] of 2nd Digit 6.5 cm   At [facility] of Thumb 6.4 cm                 OT Treatments/Exercises (OP) - 10/01/14 0001    ADLs   ADL Comments Sleeve and glove fitting well - pt to put on her calender to replace garment in 6 months from getting it - pt report trunck edema under arm decreaes since using pump    Manual Therapy   Manual therapy comments Fibrotic tech done to forearm -proximal forearm and elbow increase still but other decrease - reinforce for pt to do 2 x day pump - and no just one time for 2 hrs - need to pump after wearing daytime compression in evening because she do not have flatknit that contain more - because this one donn easier and for summer better  OT Education - 10-04-2014 1658    Education provided Yes   Education Details HEP   Person(s) Educated Patient   Methods Explanation;Tactile cues;Verbal cues;Demonstration   Comprehension Verbalized understanding;Returned demonstration;Verbal cues required          OT Short Term Goals - 04-Oct-2014 1701    OT SHORT TERM GOAL #1   Title L UE circumference decrease by 1 cm from hand to elbow and 2 cm uppper arm to be fitted for appropriate compression garments    Status Achieved   OT SHORT TERM GOAL #2   Title Pt and family be independent with skin care , exercises and home program    Status Achieved           OT Long Term Goals - Oct 04, 2014 1702    OT LONG TERM GOAL #1   Title Pt and family will be I in wearing compression garments and  glove for day/night time to maintain L UE circumference    Status Achieved               Plan - 10-04-14 1659    Clinical Impression Statement Pt measurements decreased at most area compare to last time using pump now 2 x  day - but proximal forearm and elbow still increase and still has some fibrosis in forearm - pt stil receiving chemo but trunkle lymphedema decrease with pump - pt just reminded to use it am and pm for 2 session 45 min to hour - and replace her garment in 6-8 months from  fitted with it - pt discharge at this time and can contract me if any problems  or  lymphedema  worsen    Rehab Potential Good   OT Treatment/Interventions Self-care/ADL training;Manual lymph drainage;DME and/or AE instruction   Plan discharge with day, night time comrpession and pump    OT Home Exercise Plan see pt instruction   Consulted and Agree with Plan of Care Patient          G-Codes - 10/04/14 1702    Functional Assessment Tool Used lymphedema circumference,  ROM , clincial judgement    Functional Limitation Self care   Self Care Current Status (Y1856) At least 1 percent but less than 20 percent impaired, limited or restricted   Self Care Goal Status (D1497) At least 1 percent but less than 20 percent impaired, limited or restricted   Self Care Discharge Status [phone]) At least 1 percent but less than 20 percent impaired, limited or restricted      Problem List Patient Active Problem List   Diagnosis Date Noted  . Lymphedema of arm 05/30/2014  . Breast cancer, stage 2 10/08/2013    Rosalyn Gess OTR/L,CLT 10-04-2014, 5:05 PM  [facility] [address], [place], [address] Phone: [phone]   Fax:  [phone]

## 2014-10-08 ENCOUNTER — Ambulatory Visit
Admission: RE | Admit: 2014-10-08 | Discharge: 2014-10-08 | Disposition: A | Discharge status: Home / Self Care | Payer: Medicare HMO | Source: Ambulatory Visit | Attending: Internal Medicine | Admitting: Internal Medicine

## 2014-10-08 ENCOUNTER — Other Ambulatory Visit: Payer: Self-pay | Admitting: Internal Medicine

## 2014-10-08 DIAGNOSIS — Z853 Personal history of malignant neoplasm of breast: Secondary | ICD-10-CM | POA: Insufficient documentation

## 2014-10-08 DIAGNOSIS — R918 Other nonspecific abnormal finding of lung field: Secondary | ICD-10-CM | POA: Diagnosis not present

## 2014-10-08 DIAGNOSIS — C50912 Malignant neoplasm of unspecified site of left female breast: Secondary | ICD-10-CM

## 2014-10-08 DIAGNOSIS — Z1231 Encounter for screening mammogram for malignant neoplasm of breast: Secondary | ICD-10-CM | POA: Insufficient documentation

## 2014-10-08 HISTORY — DX: Malignant neoplasm of unspecified site of unspecified female breast: C50.919

## 2014-10-08 MED ORDER — IOHEXOL 300 MG/ML  SOLN
75.0000 mL | Freq: Once | INTRAMUSCULAR | Status: AC | PRN
  Administered 2014-10-08: 75 mL via INTRAVENOUS

## 2014-10-14 ENCOUNTER — Inpatient Hospital Stay (HOSPITAL_BASED_OUTPATIENT_CLINIC_OR_DEPARTMENT_OTHER): Payer: Medicare HMO | Admitting: Internal Medicine

## 2014-10-14 ENCOUNTER — Inpatient Hospital Stay: Payer: Medicare HMO

## 2014-10-14 VITALS — BP 176/80 | HR 76 | Temp 98.2°F | Resp 18 | Ht 60.0 in | Wt 134.5 lb

## 2014-10-14 DIAGNOSIS — C773 Secondary and unspecified malignant neoplasm of axilla and upper limb lymph nodes: Secondary | ICD-10-CM

## 2014-10-14 DIAGNOSIS — C50512 Malignant neoplasm of lower-outer quadrant of left female breast: Secondary | ICD-10-CM

## 2014-10-14 DIAGNOSIS — Z79899 Other long term (current) drug therapy: Secondary | ICD-10-CM | POA: Diagnosis not present

## 2014-10-14 DIAGNOSIS — Z5111 Encounter for antineoplastic chemotherapy: Secondary | ICD-10-CM | POA: Diagnosis not present

## 2014-10-14 DIAGNOSIS — C50919 Malignant neoplasm of unspecified site of unspecified female breast: Secondary | ICD-10-CM

## 2014-10-14 DIAGNOSIS — Z171 Estrogen receptor negative status [ER-]: Secondary | ICD-10-CM | POA: Diagnosis not present

## 2014-10-14 DIAGNOSIS — C50912 Malignant neoplasm of unspecified site of left female breast: Secondary | ICD-10-CM

## 2014-10-14 LAB — HEPATIC FUNCTION PANEL
ALK PHOS: 68 U/L (ref 38–126)
ALT: 15 U/L (ref 14–54)
AST: 23 U/L (ref 15–41)
Albumin: 3.9 g/dL (ref 3.5–5.0)
Bilirubin, Direct: <0.1 mg/dL — ABNORMAL LOW (ref 0.1–0.5)
Total Bilirubin: 0.5 mg/dL (ref 0.3–1.2)
Total Protein: 7.3 g/dL (ref 6.5–8.1)

## 2014-10-14 LAB — BASIC METABOLIC PANEL
Anion gap: 4 — ABNORMAL LOW (ref 5–15)
BUN: 13 mg/dL (ref 6–20)
CO2: 30 mmol/L (ref 22–32)
CREATININE: 0.65 mg/dL (ref 0.44–1.00)
Calcium: 8.2 mg/dL — ABNORMAL LOW (ref 8.9–10.3)
Chloride: 102 mmol/L (ref 101–111)
GFR calc Af Amer: >60 mL/min (ref >60)
GFR calc non Af Amer: >60 mL/min (ref >60)
GLUCOSE: 109 mg/dL — AB (ref 65–99)
POTASSIUM: 3.4 mmol/L — AB (ref 3.5–5.1)
Sodium: 136 mmol/L (ref 135–145)

## 2014-10-14 LAB — CBC WITH DIFFERENTIAL/PLATELET
Basophils Absolute: 0 10*3/uL (ref 0–0.1)
Basophils Relative: 0 %
Eosinophils Absolute: 0.1 10*3/uL (ref 0–0.7)
Eosinophils Relative: 1 %
HCT: 36 % (ref 35.0–47.0)
Hemoglobin: 12.1 g/dL (ref 12.0–16.0)
LYMPHS PCT: 37 %
Lymphs Abs: 2 10*3/uL (ref 1.0–3.6)
MCH: 30.6 pg (ref 26.0–34.0)
MCHC: 33.6 g/dL (ref 32.0–36.0)
MCV: 91.1 fL (ref 80.0–100.0)
MONO ABS: 0.9 10*3/uL (ref 0.2–0.9)
Monocytes Relative: 16 %
Neutro Abs: 2.4 10*3/uL (ref 1.4–6.5)
Neutrophils Relative %: 46 %
PLATELETS: 223 10*3/uL (ref 150–440)
RBC: 3.95 MIL/uL (ref 3.80–5.20)
RDW: 13.6 % (ref 11.5–14.5)
WBC: 5.4 10*3/uL (ref 3.6–11.0)

## 2014-10-14 MED ORDER — POTASSIUM CHLORIDE CRYS ER 20 MEQ PO TBCR
20.0000 meq | EXTENDED_RELEASE_TABLET | Freq: Once | ORAL | Status: AC
  Administered 2014-10-14: 20 meq via ORAL
  Filled 2014-10-14: qty 1

## 2014-10-14 MED ORDER — SODIUM CHLORIDE 0.9 % IJ SOLN
10.0000 mL | INTRAMUSCULAR | Status: DC | PRN
  Administered 2014-10-14: 10 mL
  Filled 2014-10-14: qty 10

## 2014-10-14 MED ORDER — POTASSIUM CHLORIDE CRYS ER 20 MEQ PO TBCR: 20.0000 meq | EXTENDED_RELEASE_TABLET | Freq: Once | ORAL | Status: DC

## 2014-10-14 MED ORDER — DIPHENHYDRAMINE HCL 25 MG PO CAPS
25.0000 mg | ORAL_CAPSULE | Freq: Once | ORAL | Status: AC
  Administered 2014-10-14: 25 mg via ORAL
  Filled 2014-10-14: qty 1

## 2014-10-14 MED ORDER — HEPARIN SOD (PORK) LOCK FLUSH 100 UNIT/ML IV SOLN
500.0000 [IU] | Freq: Once | INTRAVENOUS | Status: AC | PRN
  Administered 2014-10-14: 500 [IU]
  Filled 2014-10-14: qty 5

## 2014-10-14 MED ORDER — TRASTUZUMAB CHEMO INJECTION 440 MG
6.0000 mg/kg | Freq: Once | INTRAVENOUS | Status: AC
  Administered 2014-10-14: 357 mg via INTRAVENOUS
  Filled 2014-10-14: qty 17

## 2014-10-14 MED ORDER — ACETAMINOPHEN 325 MG PO TABS
650.0000 mg | ORAL_TABLET | Freq: Once | ORAL | Status: AC
  Administered 2014-10-14: 650 mg via ORAL
  Filled 2014-10-14: qty 2

## 2014-10-14 MED ORDER — SODIUM CHLORIDE 0.9 % IV SOLN
Freq: Once | INTRAVENOUS | Status: AC
  Administered 2014-10-14: 15:00:00 via INTRAVENOUS
  Filled 2014-10-14: qty 1000

## 2014-10-14 NOTE — Progress Notes (Signed)
Patient is here for follow-up and herceptin treatment. She states that overall she has been doing ok. She states that her appetite has been good and she denies any pain today.

## 2014-10-20 NOTE — Progress Notes (Unsigned)
PSN met with patient today.  Patient having trouble paying electric bill and water bill due to need to pay for medical expenses.  Patient reported that she and her husband have cancer and require frequent medical follow-up.  Patient will be assisted with electric bill and water bill through the [facility] for 2-3 months.

## 2014-10-30 NOTE — Progress Notes (Signed)
[facility]  [phone] [phone]     ID: [name] OB: 1940-11-19  MR#: [id number]  QMV#:[id number]  Patient Care Team: Albina Billet, MD as PCP - General (Internal Medicine) [name], MD as Consulting Physician (General Surgery) Albina Billet, MD (Internal Medicine)  CHIEF COMPLAINT/DIAGNOSIS:  1. 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  (ER -, PR weak + at 1-10%, Her-2/neu positive at 3+ on IHC). 10/16/13 - PET scan. IMPRESSION:  2.8 cm soft tissue lesion in the lower outer left breast, corresponding to known primary bronchogenic neoplasm. Associated 11 mm short axis left axillary nodal metastasis. No evidence of distant metastases. Mildly cystic cavitary lesion with surrounding opacity and bronchial wall thickening in the right upper lobe, as described above. Got neoadjuvant chemotherapy with Adriamycin/Cytoxan x 4  (given concurrent large lung lesion being lymphoma) from 11/10/13 - 01/12/14. Minimal response of breast tumor. Underwent left modified radical mastectomy on 02/25/14. Surgical pathology report.  A.LEFT BREAST AND AXILLARY LYMPH NODES; MODIFIED RADICAL MASTECTOMY: - INVASIVE MAMMARY CARCINOMA OF NO SPECIAL TYPE, 3.2 CM, GRADE 3. - MARGINS UNINVOLVED. - METASTATIC CARCINOMA IN ONE OF 11 AXILLARY LYMPH NODES (1/11).  Started Taxol/Herceptin on 04/01/14. Completed 12 weeks of Taxol on 06/24/14. Continues on Herceptin.   2. MALT lymphoma right lung, stage IVE.  Right upper lung large cavity abnormality on PET/CT scan of 10/16/13 - bronchoscopy/bx on 10/30/13 reports:RIGHT UPPER LOBE BIOPSY POSITIVE FOR MALIGNANT CELLS. EXTRANODAL MARIGNAL ZONE LYMPHOMA OF MUCOSA-ASSOCIATED LYMPHOID TISSUE (MALT  LYMPHOMA). SEE COMMENT.     HISTORY OF PRESENT ILLNESS:  Patient returns for continued oncology follow-up and plan next dose of treatment with Herceptin. States that chronic fatigue on exertion and activity is at baseline, she denies any new complaints. Appetite is good, denies unintentional weight loss. States that she tries to remain physically active. Denies any new dyspnea, orthopnea, PND or leg swelling. No new paresthesias in the extremities. Denies new cough, sputum, chest pain or hemoptysis. No new mood disturbances. No fevers. No nausea or vomiting. No new bone pains.  REVIEW OF SYSTEMS:   ROS As in HPI above. In addition, no fever, chills or sweats. No new headaches or focal weakness.  No new mood disturbances. No  sore throat, cough, shortness of breath, sputum, hemoptysis or chest pain. No dizziness or palpitation. No abdominal pain, constipation, diarrhea, dysuria or hematuria. No new skin rash or bleeding symptoms. No new paresthesias in extremities. PS ECOG 2.   PAST MEDICAL HISTORY: Past Medical History  Diagnosis Date  . Hypertension   . Hyperlipidemia   . Murmur   . Hyperlipemia   . Osteoporosis   . 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 2 cm axillary node. One of 11 nodes positive on post-adjuvant chemotherapy axillary dissection. 3+ centimeter tumor.  . Breast cancer 2015    left breast cancer          Hypertension Murmur Hyperlipidemia Osteoporosis Abdominal hysterectomy Appendectomy     Gyn hx: G3P3. She is postmenopausal.  PAST SURGICAL HISTORY: Past Surgical History  Procedure Laterality Date  . Abdominal hysterectomy    . Appendectomy    . Mastectomy Left 02-25-14  Dr Bary Castilla  . Breast surgery Left 02/25/14    Modified radical mastectomy, T2 N1. Grade 3, ER, PR negative, HER-2/neu 3+.    FAMILY HISTORY - remarkable for  hypertension. Denies malignancy or hematologic disorders.  SOCIAL HISTORY: Social History  Substance Use Topics  . Smoking status: Never Smoker   . Smokeless tobacco: Never Used  . Alcohol Use: No  Denies smoking or alcohol usage. Physically active and ambulatory. Lives with her husband.  No Known Allergies  Current Outpatient Prescriptions  Medication Sig Dispense Refill  . alendronate (FOSAMAX) 70 MG tablet Take 70 mg by mouth once a week. Take with a full glass of water on an empty stomach.    Marland Kitchen amLODipine (NORVASC) 2.5 MG tablet Take 2.5 mg by mouth daily.    Marland Kitchen aspirin 81 MG tablet Take 81 mg by mouth daily.    . cholecalciferol (VITAMIN D) 1000 UNITS tablet Take 1,000 Units by mouth daily.    Marland Kitchen losartan-hydrochlorothiazide (HYZAAR) 100-25 MG per tablet Take 1 tablet by mouth daily.    . Multiple Vitamins-Minerals (MULTIVITAMIN WITH MINERALS) tablet Take 1 tablet by mouth daily.    . simvastatin (ZOCOR) 40 MG tablet Take 40 mg by mouth at bedtime.     No current facility-administered medications for this visit.   Facility-Administered Medications Ordered in Other Visits  Medication Dose Route Frequency Provider Last Rate Last Dose  . sodium chloride 0.9 % injection 10 mL  10 mL Intracatheter PRN Leia Alf, MD   10 mL at 06/17/14 0930  . sodium chloride 0.9 % injection 10 mL  10 mL Intracatheter PRN Leia Alf, MD   10 mL at 08/12/14 1357    OBJECTIVE: Filed Vitals:   10/14/14 1431  BP: 176/80  Pulse: 76  Temp: 98.2 F (36.8 C)  Resp: 18     Body mass index is 26.26 kg/(m^2).    ECOG FS:2 - Symptomatic, <50% confined to bed  GENERAL: Alert and oriented and in no acute distress. No icterus. HEENT: EOMs intact. Oral exam negative for thrush or lesions.   CVS: S1S2, regular LUNGS: Bilaterally clear to auscultation, no rhonchi. ABDOMEN: Soft, nontender. No hepatosplenomegaly clinically.  NEURO: grossly nonfocal, cranial nerves are intact. Gait  unremarkable. EXTREMITIES: No pedal edema.   LAB RESULTS:    Component Value Date/Time   NA 136 10/14/2014 1409   NA 136 05/27/2014 0942   NA 142 10/08/2013 1631   K 3.4* 10/14/2014 1409   K 3.4* 05/27/2014 0942   CL 102 10/14/2014 1409   CL 102 05/27/2014 0942   CO2 30 10/14/2014 1409   CO2 29 05/27/2014 0942   GLUCOSE 109* 10/14/2014 1409   GLUCOSE 106* 05/27/2014 0942   GLUCOSE 110* 10/08/2013 1631   BUN 13 10/14/2014 1409   BUN 13 05/27/2014 0942   BUN 10 10/08/2013 1631   CREATININE 0.65 10/14/2014 1409   CREATININE 0.58 05/27/2014 0942   CALCIUM 8.2* 10/14/2014 1409   CALCIUM 8.6* 05/27/2014 0942   PROT 7.3 10/14/2014 1409   PROT 6.8 05/27/2014 0942   PROT 7.9 10/08/2013 1631   ALBUMIN 3.9 10/14/2014 1409   ALBUMIN 3.8 05/27/2014 0942   AST 23 10/14/2014 1409   AST 18 05/27/2014 0942   ALT 15 10/14/2014 1409   ALT 14 05/27/2014 0942   ALKPHOS 68 10/14/2014 1409   ALKPHOS 69 05/27/2014 0942   BILITOT 0.5 10/14/2014 1409   BILITOT 0.6 05/27/2014 0942   GFRNONAA >60 10/14/2014 1409   GFRNONAA >  60 05/27/2014 0942   GFRNONAA >60 01/26/2014 0943   GFRAA >60 10/14/2014 1409   GFRAA >60 05/27/2014 0942   GFRAA >60 01/26/2014 0943   WBC 5.4, hemoglobin 12.1, platelets 223, ANC 2.4.  09/23/14 - serum CA 27.29 level normal at 35.6.           STUDIES: 07/24/14 -  MUGA scan.  FINDINGS: No focal wall motion abnormality of the left ventricle. Calculated left ventricular ejection fraction equals 64%. Compared to 79% on 10/17/2013.  IMPRESSION: Ejection fraction normal at 64%. Reduction from supraphysiologic ejection fraction on 10/17/2013.  10/08/14 - CT scan of the chest with contrast.  IMPRESSION:  1. Stable airspace opacity with associated cylindrical bronchiectasis tracking anterior to the right major fissure in the right upper lobe and to a lesser extent the right middle lobe. Some of this may reflect radiation therapy, but underlying residual pulmonary lymphoma is  not excluded. No overt adenopathy.  2. Thoracic epidural lipomatosis and thoracic kyphosis.  3. Mild atherosclerosis.  10/08/14 - Right Mammogram.  IMPRESSION:  No mammographic evidence of malignancy.  RECOMMENDATION: Screening mammogram in one year. BI-RADS CATEGORY 1: Negative.    ASSESSMENT / PLAN:   1. 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 10/16/13 - PET scan. IMPRESSION:  2.8 cm soft tissue lesion in the lower outer left breast, corresponding to known primary bronchogenic neoplasm. Associated 11 mm short axis left axillary nodal metastasis. No evidence of distant metastases. Mildly cystic cavitary lesion with surrounding opacity and bronchial wall thickening in the right upper lobe, as described above. Got neoadjuvant chemotherapy with Adriamycin/Cytoxan x 4  (given concurrent large lung lesion being lymphoma) from 11/10/13 - 01/12/14. Minimal response of breast tumor. Underwent left modified radical mastectomy on 02/25/14 (Surgical pathology report. INVASIVE MAMMARY CARCINOMA OF NO SPECIAL TYPE, 3.2 CM, GRADE 3. MARGINS UNINVOLVED. METASTATIC CARCINOMA IN ONE OF 11 AXILLARY LYMPH NODES). Started Taxol/Herceptin on 04/01/14. Completed 12 weeks of Taxol on 06/24/14. Continues on Herceptin   -    reviewed labs and d/w patient and her daughter present. Have again explained that plan is to complete Herceptin x52 weeks course. Last 2DEcho reports low normal LVEF of 55-60%. Recent MUGA scan on June 10 reported similar EF of 64%. No clinical symptoms to suggest congestive heart failure. Will proceed with next dose of q3 weekly Herceptin 6 mg/kg IV today, and plan next 2 doses at 3 weeks at 6 weeks from now. Next MD f/u at 9 weeks with labs and plan continued treatment.   2. MALT lymphoma right lung, stage IVE.  Right upper lung large cavity abnormality on PET/CT scan of 10/16/13 -  bronchoscopy/bx on 10/30/13 reports: RIGHT UPPER LOBE BIOPSY POSITIVE FOR MALIGNANT CELLS. EXTRANODAL MARIGNAL ZONE LYMPHOMA OF MUCOSA-ASSOCIATED LYMPHOID TISSUE (MALT LYMPHOMA) - repeat CT scan of the chest on 10/08/2014 reports stable airspace opacity in the right upper lobe and right middle lobe. There is no evidence of lymphadenopathy or any hepatosplenomegaly. Continue to monitor.  3. Anemia - hemoglobin improving, anemia was likely from chemotherapy. Monitor.      4. In between visits, the patient has been advised to call or come to the ER in case of fevers, bleeding, acute sickness or new symptoms. Patient agreeable to this plan.   Leia Alf, MD   10/30/2014 6:04 PM

## 2014-11-04 ENCOUNTER — Inpatient Hospital Stay: Payer: Medicare HMO | Attending: Internal Medicine

## 2014-11-04 ENCOUNTER — Ambulatory Visit
Admission: RE | Admit: 2014-11-04 | Discharge: 2014-11-04 | Disposition: A | Discharge status: Home / Self Care | Payer: Medicare HMO | Source: Ambulatory Visit | Attending: Internal Medicine | Admitting: Internal Medicine

## 2014-11-04 VITALS — BP 128/71 | HR 76 | Temp 96.4°F | Resp 18

## 2014-11-04 DIAGNOSIS — Z171 Estrogen receptor negative status [ER-]: Secondary | ICD-10-CM | POA: Insufficient documentation

## 2014-11-04 DIAGNOSIS — Z5111 Encounter for antineoplastic chemotherapy: Secondary | ICD-10-CM | POA: Diagnosis present

## 2014-11-04 DIAGNOSIS — C50512 Malignant neoplasm of lower-outer quadrant of left female breast: Secondary | ICD-10-CM | POA: Diagnosis not present

## 2014-11-04 DIAGNOSIS — Z79899 Other long term (current) drug therapy: Secondary | ICD-10-CM | POA: Diagnosis not present

## 2014-11-04 DIAGNOSIS — C50919 Malignant neoplasm of unspecified site of unspecified female breast: Secondary | ICD-10-CM

## 2014-11-04 DIAGNOSIS — C773 Secondary and unspecified malignant neoplasm of axilla and upper limb lymph nodes: Secondary | ICD-10-CM | POA: Insufficient documentation

## 2014-11-04 DIAGNOSIS — C50912 Malignant neoplasm of unspecified site of left female breast: Secondary | ICD-10-CM | POA: Insufficient documentation

## 2014-11-04 DIAGNOSIS — C801 Malignant (primary) neoplasm, unspecified: Secondary | ICD-10-CM

## 2014-11-04 MED ORDER — TECHNETIUM TC 99M-LABELED RED BLOOD CELLS IV KIT
21.2100 | PACK | Freq: Once | INTRAVENOUS | Status: AC | PRN
  Administered 2014-11-04: 21.21 via INTRAVENOUS

## 2014-11-04 MED ORDER — SODIUM CHLORIDE 0.9 % IV SOLN
Freq: Once | INTRAVENOUS | Status: AC
  Administered 2014-11-04: 15:00:00 via INTRAVENOUS
  Filled 2014-11-04: qty 1000

## 2014-11-04 MED ORDER — HEPARIN SOD (PORK) LOCK FLUSH 100 UNIT/ML IV SOLN
500.0000 [IU] | Freq: Once | INTRAVENOUS | Status: AC
  Administered 2014-11-04: 500 [IU] via INTRAVENOUS
  Filled 2014-11-04: qty 5

## 2014-11-04 MED ORDER — ACETAMINOPHEN 325 MG PO TABS
650.0000 mg | ORAL_TABLET | Freq: Once | ORAL | Status: AC
Start: 2014-11-04 — End: 2014-11-04
  Administered 2014-11-04: 650 mg via ORAL
  Filled 2014-11-04: qty 2

## 2014-11-04 MED ORDER — SODIUM CHLORIDE 0.9 % IJ SOLN
10.0000 mL | INTRAMUSCULAR | Status: DC | PRN
  Administered 2014-11-04: 10 mL via INTRAVENOUS
  Filled 2014-11-04: qty 10

## 2014-11-04 MED ORDER — TRASTUZUMAB CHEMO INJECTION 440 MG
6.0000 mg/kg | Freq: Once | INTRAVENOUS | Status: AC
  Administered 2014-11-04: 357 mg via INTRAVENOUS
  Filled 2014-11-04: qty 17

## 2014-11-04 MED ORDER — DIPHENHYDRAMINE HCL 25 MG PO CAPS
25.0000 mg | ORAL_CAPSULE | Freq: Once | ORAL | Status: AC
  Administered 2014-11-04: 25 mg via ORAL
  Filled 2014-11-04: qty 1

## 2014-11-25 ENCOUNTER — Inpatient Hospital Stay: Payer: Medicare HMO | Attending: Internal Medicine

## 2014-11-25 ENCOUNTER — Other Ambulatory Visit: Payer: Self-pay | Admitting: Family Medicine

## 2014-11-25 VITALS — BP 148/72 | HR 74 | Temp 97.7°F | Resp 20

## 2014-11-25 DIAGNOSIS — Z171 Estrogen receptor negative status [ER-]: Secondary | ICD-10-CM | POA: Diagnosis not present

## 2014-11-25 DIAGNOSIS — C50512 Malignant neoplasm of lower-outer quadrant of left female breast: Secondary | ICD-10-CM | POA: Diagnosis not present

## 2014-11-25 DIAGNOSIS — C773 Secondary and unspecified malignant neoplasm of axilla and upper limb lymph nodes: Secondary | ICD-10-CM | POA: Diagnosis not present

## 2014-11-25 DIAGNOSIS — C50919 Malignant neoplasm of unspecified site of unspecified female breast: Secondary | ICD-10-CM

## 2014-11-25 DIAGNOSIS — Z5111 Encounter for antineoplastic chemotherapy: Secondary | ICD-10-CM | POA: Diagnosis not present

## 2014-11-25 DIAGNOSIS — Z79899 Other long term (current) drug therapy: Secondary | ICD-10-CM | POA: Insufficient documentation

## 2014-11-25 MED ORDER — SODIUM CHLORIDE 0.9 % IV SOLN
Freq: Once | INTRAVENOUS | Status: AC
  Administered 2014-11-25: 15:00:00 via INTRAVENOUS
  Filled 2014-11-25: qty 1000

## 2014-11-25 MED ORDER — HEPARIN SOD (PORK) LOCK FLUSH 100 UNIT/ML IV SOLN
500.0000 [IU] | Freq: Once | INTRAVENOUS | Status: AC | PRN
  Administered 2014-11-25: 500 [IU]
  Filled 2014-11-25: qty 5

## 2014-11-25 MED ORDER — TRASTUZUMAB CHEMO INJECTION 440 MG
6.0000 mg/kg | Freq: Once | INTRAVENOUS | Status: AC
  Administered 2014-11-25: 357 mg via INTRAVENOUS
  Filled 2014-11-25: qty 17

## 2014-11-25 MED ORDER — SODIUM CHLORIDE 0.9 % IJ SOLN
10.0000 mL | INTRAMUSCULAR | Status: DC | PRN
  Administered 2014-11-25: 10 mL
  Filled 2014-11-25: qty 10

## 2014-11-25 MED ORDER — ACETAMINOPHEN 325 MG PO TABS
650.0000 mg | ORAL_TABLET | Freq: Once | ORAL | Status: AC
  Administered 2014-11-25: 650 mg via ORAL
  Filled 2014-11-25: qty 2

## 2014-11-25 MED ORDER — DIPHENHYDRAMINE HCL 25 MG PO CAPS
25.0000 mg | ORAL_CAPSULE | Freq: Once | ORAL | Status: AC
  Administered 2014-11-25: 25 mg via ORAL
  Filled 2014-11-25: qty 1

## 2014-12-15 ENCOUNTER — Other Ambulatory Visit: Payer: Self-pay | Admitting: *Deleted

## 2014-12-15 DIAGNOSIS — C50912 Malignant neoplasm of unspecified site of left female breast: Secondary | ICD-10-CM

## 2014-12-16 ENCOUNTER — Inpatient Hospital Stay: Payer: Medicare HMO | Attending: Internal Medicine

## 2014-12-16 ENCOUNTER — Encounter: Payer: Self-pay | Admitting: Internal Medicine

## 2014-12-16 ENCOUNTER — Inpatient Hospital Stay (HOSPITAL_BASED_OUTPATIENT_CLINIC_OR_DEPARTMENT_OTHER): Payer: Medicare HMO | Admitting: Internal Medicine

## 2014-12-16 ENCOUNTER — Inpatient Hospital Stay: Payer: Medicare HMO

## 2014-12-16 VITALS — BP 156/81 | HR 79 | Temp 97.1°F | Resp 18 | Ht 60.0 in | Wt 139.1 lb

## 2014-12-16 DIAGNOSIS — E876 Hypokalemia: Secondary | ICD-10-CM | POA: Insufficient documentation

## 2014-12-16 DIAGNOSIS — I10 Essential (primary) hypertension: Secondary | ICD-10-CM | POA: Diagnosis not present

## 2014-12-16 DIAGNOSIS — C50912 Malignant neoplasm of unspecified site of left female breast: Secondary | ICD-10-CM | POA: Insufficient documentation

## 2014-12-16 DIAGNOSIS — Z171 Estrogen receptor negative status [ER-]: Secondary | ICD-10-CM | POA: Diagnosis not present

## 2014-12-16 DIAGNOSIS — I890 Lymphedema, not elsewhere classified: Secondary | ICD-10-CM | POA: Diagnosis not present

## 2014-12-16 DIAGNOSIS — Z79899 Other long term (current) drug therapy: Secondary | ICD-10-CM | POA: Insufficient documentation

## 2014-12-16 DIAGNOSIS — Z7982 Long term (current) use of aspirin: Secondary | ICD-10-CM | POA: Insufficient documentation

## 2014-12-16 DIAGNOSIS — C773 Secondary and unspecified malignant neoplasm of axilla and upper limb lymph nodes: Secondary | ICD-10-CM | POA: Insufficient documentation

## 2014-12-16 DIAGNOSIS — C50919 Malignant neoplasm of unspecified site of unspecified female breast: Secondary | ICD-10-CM

## 2014-12-16 DIAGNOSIS — Z8572 Personal history of non-Hodgkin lymphomas: Secondary | ICD-10-CM

## 2014-12-16 DIAGNOSIS — E785 Hyperlipidemia, unspecified: Secondary | ICD-10-CM | POA: Insufficient documentation

## 2014-12-16 DIAGNOSIS — Z5112 Encounter for antineoplastic immunotherapy: Secondary | ICD-10-CM | POA: Insufficient documentation

## 2014-12-16 LAB — COMPREHENSIVE METABOLIC PANEL
ALBUMIN: 4 g/dL (ref 3.5–5.0)
ALT: 16 U/L (ref 14–54)
AST: 34 U/L (ref 15–41)
Alkaline Phosphatase: 61 U/L (ref 38–126)
Anion gap: 9 (ref 5–15)
BILIRUBIN TOTAL: 0.6 mg/dL (ref 0.3–1.2)
BUN: 12 mg/dL (ref 6–20)
CHLORIDE: 98 mmol/L — AB (ref 101–111)
CO2: 28 mmol/L (ref 22–32)
Calcium: 8.3 mg/dL — ABNORMAL LOW (ref 8.9–10.3)
Creatinine, Ser: 0.71 mg/dL (ref 0.44–1.00)
GFR calc Af Amer: >60 mL/min (ref >60)
GFR calc non Af Amer: >60 mL/min (ref >60)
GLUCOSE: 161 mg/dL — AB (ref 65–99)
POTASSIUM: 3.1 mmol/L — AB (ref 3.5–5.1)
Sodium: 135 mmol/L (ref 135–145)
Total Protein: 7.4 g/dL (ref 6.5–8.1)

## 2014-12-16 LAB — CBC WITH DIFFERENTIAL/PLATELET
BASOS ABS: 0 10*3/uL (ref 0–0.1)
BASOS PCT: 0 %
Eosinophils Absolute: 0 10*3/uL (ref 0–0.7)
Eosinophils Relative: 1 %
HEMATOCRIT: 36.3 % (ref 35.0–47.0)
Hemoglobin: 12.2 g/dL (ref 12.0–16.0)
Lymphocytes Relative: 40 %
Lymphs Abs: 2 10*3/uL (ref 1.0–3.6)
MCH: 30.7 pg (ref 26.0–34.0)
MCHC: 33.5 g/dL (ref 32.0–36.0)
MCV: 91.7 fL (ref 80.0–100.0)
MONO ABS: 0.7 10*3/uL (ref 0.2–0.9)
Monocytes Relative: 14 %
NEUTROS ABS: 2.2 10*3/uL (ref 1.4–6.5)
Neutrophils Relative %: 45 %
PLATELETS: 227 10*3/uL (ref 150–440)
RBC: 3.96 MIL/uL (ref 3.80–5.20)
RDW: 13.7 % (ref 11.5–14.5)
WBC: 5 10*3/uL (ref 3.6–11.0)

## 2014-12-16 MED ORDER — SODIUM CHLORIDE 0.9 % IV SOLN
Freq: Once | INTRAVENOUS | Status: AC
  Administered 2014-12-16: 15:00:00 via INTRAVENOUS
  Filled 2014-12-16: qty 1000

## 2014-12-16 MED ORDER — HEPARIN SOD (PORK) LOCK FLUSH 100 UNIT/ML IV SOLN
500.0000 [IU] | Freq: Once | INTRAVENOUS | Status: AC | PRN
  Administered 2014-12-16: 500 [IU]
  Filled 2014-12-16: qty 5

## 2014-12-16 MED ORDER — TRASTUZUMAB CHEMO INJECTION 440 MG
6.0000 mg/kg | Freq: Once | INTRAVENOUS | Status: AC
  Administered 2014-12-16: 357 mg via INTRAVENOUS
  Filled 2014-12-16: qty 17

## 2014-12-16 MED ORDER — ACETAMINOPHEN 325 MG PO TABS
650.0000 mg | ORAL_TABLET | Freq: Once | ORAL | Status: AC
  Administered 2014-12-16: 650 mg via ORAL
  Filled 2014-12-16: qty 2

## 2014-12-16 MED ORDER — DIPHENHYDRAMINE HCL 25 MG PO CAPS
25.0000 mg | ORAL_CAPSULE | Freq: Once | ORAL | Status: AC
  Administered 2014-12-16: 25 mg via ORAL
  Filled 2014-12-16: qty 1

## 2014-12-16 NOTE — Progress Notes (Signed)
Pt doing well , tolerating herceptin without problems.

## 2014-12-16 NOTE — Progress Notes (Signed)
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  Patient denies any unusual shortness of breath or chest pain. Denies any leg swelling. No diarrhea. No weight loss. No headaches. No vision changes.  REVIEW OF SYSTEMS:  A complete 10 point review of system is done which is negative except mentioned above/history of present illness.   PAST MEDICAL HISTORY :  Past Medical History  Diagnosis Date  . Hypertension   . Hyperlipidemia   . Murmur   . Hyperlipemia   . Osteoporosis   . Breast cancer metastasized to axillary lymph node ([place]) August 2015    T2, N1, ER negative, PR negative, HER-2 amplified. 2 cm axillary node. One of 11 nodes positive on post-adjuvant chemotherapy axillary dissection. 3+ centimeter tumor.  . Breast cancer ([place]) 2015    left breast cancer  . MALT lymphoma ([place])     PAST SURGICAL HISTORY :   Past Surgical History  Procedure Laterality Date  . Abdominal hysterectomy    . Appendectomy    . Mastectomy Left 02-25-14    Dr Bary Castilla  . Breast surgery Left 02/25/14    Modified radical mastectomy, T2 N1. Grade 3, ER, PR negative, HER-2/neu 3+.    FAMILY HISTORY :   Family  History  Problem Relation Age of Onset  . Hypertension Mother   . Hypertension Maternal Aunt   . Hypertension Maternal Uncle   . Hypertension Maternal Aunt   . Hypertension Maternal Aunt     SOCIAL HISTORY:   Social History  Substance Use Topics  . Smoking status: Never Smoker   . Smokeless tobacco: Never Used  . Alcohol Use: No    ALLERGIES:  has No Known Allergies.  MEDICATIONS:  Current Outpatient Prescriptions  Medication Sig Dispense Refill  . alendronate (FOSAMAX) 70 MG tablet Take 70 mg by mouth once a week. Take with a full glass of water on an empty stomach.    Marland Kitchen amLODipine (NORVASC) 2.5 MG tablet Take 2.5 mg by mouth daily.    Marland Kitchen aspirin 81 MG tablet Take 81 mg by mouth daily.    Marland Kitchen losartan-hydrochlorothiazide (HYZAAR) 100-25 MG per tablet Take 1 tablet by mouth daily.    . Multiple Vitamins-Minerals (MULTIVITAMIN WITH MINERALS) tablet Take 1 tablet by mouth daily.    . simvastatin (ZOCOR) 40 MG tablet Take 40 mg by mouth at bedtime.    . cholecalciferol (VITAMIN D) 1000 UNITS tablet Take 1,000 Units by mouth daily.     No current facility-administered medications for this visit.   Facility-Administered Medications Ordered in Other Visits  Medication Dose Route Frequency Provider Last Rate Last Dose  . sodium chloride 0.9 % injection 10 mL  10 mL Intracatheter PRN Leia Alf, MD   10 mL at 06/17/14 0930  . sodium chloride 0.9 % injection 10 mL  10 mL Intracatheter PRN Leia Alf, MD   10 mL at 08/12/14 1357    PHYSICAL EXAMINATION: ECOG PERFORMANCE STATUS: 0 - Asymptomatic  BP 156/81 mmHg  Pulse 79  Temp(Src) 97.1 F (36.2 C) (Tympanic)  Resp 18  Ht 5' (1.524 m)  Wt 139 lb 1.8 oz (63.1 kg)  BMI 27.17 kg/m2  Filed Weights   12/16/14 1412  Weight: 139 lb 1.8 oz (63.1 kg)    GENERAL: Well-nourished well-developed; Alert, no distress and comfortable.  With daughter.  EYES: no pallor or icterus.  OROPHARYNX: no thrush or ulceration; poor  dentition.no masses felt LYMPH:  no palpable lymphadenopathy in the cervical, axillary or inguinal regions LUNGS: clear to auscultation and  No wheeze or crackles HEART/CVS: regular rate & rhythm and no murmurs; No lower extremity edema; lymphedema noted in the left upper extremity/she is wearing a sleeve.  ABDOMEN:abdomen soft, non-tender and normal bowel sounds Musculoskeletal:no cyanosis of digits and no clubbing  PSYCH: alert & oriented x 3 with fluent speech NEURO: no focal motor/sensory deficits SKIN:  no rashes or significant lesions  LABORATORY DATA:  I have reviewed the data as listed    Component Value Date/Time   NA 135 12/16/2014 1344   NA 136 05/27/2014 0942   NA 142 10/08/2013 1631   K 3.1* 12/16/2014 1344   K 3.4* 05/27/2014 0942   CL 98* 12/16/2014 1344   CL 102 05/27/2014 0942   CO2 28 12/16/2014 1344   CO2 29 05/27/2014 0942   GLUCOSE 161* 12/16/2014 1344   GLUCOSE 106* 05/27/2014 0942   GLUCOSE 110* 10/08/2013 1631   BUN 12 12/16/2014 1344   BUN 13 05/27/2014 0942   BUN 10 10/08/2013 1631   CREATININE 0.71 12/16/2014 1344   CREATININE 0.58 05/27/2014 0942   CALCIUM 8.3* 12/16/2014 1344   CALCIUM 8.6* 05/27/2014 0942   PROT 7.4 12/16/2014 1344   PROT 6.8 05/27/2014 0942   PROT 7.9 10/08/2013 1631   ALBUMIN 4.0 12/16/2014 1344   ALBUMIN 3.8 05/27/2014 0942   ALBUMIN 4.5 10/08/2013 1631   AST 34 12/16/2014 1344   AST 18 05/27/2014 0942   ALT 16 12/16/2014 1344   ALT 14 05/27/2014 0942   ALKPHOS 61 12/16/2014 1344   ALKPHOS 69 05/27/2014 0942   BILITOT 0.6 12/16/2014 1344   BILITOT 0.6 05/27/2014 0942   GFRNONAA >60 12/16/2014 1344   GFRNONAA >60 05/27/2014 0942   GFRNONAA >60 01/26/2014 0943   GFRAA >60 12/16/2014 1344   GFRAA >60 05/27/2014 0942   GFRAA >60 01/26/2014 0943    No results found for: SPEP, UPEP  Lab Results  Component Value Date   WBC 5.0 12/16/2014   NEUTROABS 2.2 12/16/2014   HGB 12.2 12/16/2014   HCT 36.3 12/16/2014    MCV 91.7 12/16/2014   PLT 227 12/16/2014      Chemistry      Component Value Date/Time   NA 135 12/16/2014 1344   NA 136 05/27/2014 0942   NA 142 10/08/2013 1631   K 3.1* 12/16/2014 1344   K 3.4* 05/27/2014 0942   CL 98* 12/16/2014 1344   CL 102 05/27/2014 0942   CO2 28 12/16/2014 1344   CO2 29 05/27/2014 0942   BUN 12 12/16/2014 1344   BUN 13 05/27/2014 0942   BUN 10 10/08/2013 1631   CREATININE 0.71 12/16/2014  1344   CREATININE 0.58 05/27/2014 0942      Component Value Date/Time   CALCIUM 8.3* 12/16/2014 1344   CALCIUM 8.6* 05/27/2014 0942   ALKPHOS 61 12/16/2014 1344   ALKPHOS 69 05/27/2014 0942   AST 34 12/16/2014 1344   AST 18 05/27/2014 0942   ALT 16 12/16/2014 1344   ALT 14 05/27/2014 0942   BILITOT 0.6 12/16/2014 1344   BILITOT 0.6 05/27/2014 0942       RADIOGRAPHIC STUDIES: I have personally reviewed the radiological images as listed and agreed with the findings in the report. No results found.   ASSESSMENT & PLAN:   # Stage II ER negative PR weak HER-2/neu positive breast cancer currently on adjuvant/maintenance Herceptin. Patient tolerating Herceptin without any major side effects; no diarrhea.  Most recent MUGA scan September 2016 for normal limits.  I discussed with the patient/daughter regarding radiation- given the 1 positive lymph node on axillary lymph node dissection. The indication for radiation is not too strong; given just one positive lymph node on ALND; and the fact patient had mastectomy. Patient already has lymphedema; which potentially could be worse with adding radiation. We decided to hold off radiation.  # Hypokalemia potassium 3.1- recommend dietary intake of bananas and tomatoes. She is not on any diuretics.  # History of MALT lymphoma of the lung- clinically no evidence of progression noted.  most recent scan August 2015 no evidence of progression.    # Follow-up with me in approximately 6 weeks with labs CBC BMP.   All questions  were answered. The patient knows to call the clinic with any problems, questions or concerns. No barriers to learning was detected. I spent 25 minutes counseling the patient face to face. The total time spent in the appointment was 30 minutes and more than 50% was on counseling and review of test results     Cammie Sickle, MD 12/16/2014 2:34 PM

## 2014-12-17 LAB — CANCER ANTIGEN 27.29: CA 27.29: 41.9 U/mL — ABNORMAL HIGH (ref 0.0–38.6)

## 2014-12-22 ENCOUNTER — Other Ambulatory Visit: Payer: Self-pay | Admitting: Internal Medicine

## 2015-01-06 ENCOUNTER — Inpatient Hospital Stay: Payer: Medicare HMO

## 2015-01-06 VITALS — BP 138/78 | HR 78 | Temp 97.0°F | Resp 18

## 2015-01-06 DIAGNOSIS — C50919 Malignant neoplasm of unspecified site of unspecified female breast: Secondary | ICD-10-CM

## 2015-01-06 DIAGNOSIS — Z5112 Encounter for antineoplastic immunotherapy: Secondary | ICD-10-CM | POA: Diagnosis not present

## 2015-01-06 MED ORDER — ACETAMINOPHEN 325 MG PO TABS
650.0000 mg | ORAL_TABLET | Freq: Once | ORAL | Status: AC
  Administered 2015-01-06: 650 mg via ORAL
  Filled 2015-01-06: qty 2

## 2015-01-06 MED ORDER — SODIUM CHLORIDE 0.9 % IJ SOLN
10.0000 mL | INTRAMUSCULAR | Status: DC | PRN
  Filled 2015-01-06: qty 10

## 2015-01-06 MED ORDER — TRASTUZUMAB CHEMO INJECTION 440 MG
6.0000 mg/kg | Freq: Once | INTRAVENOUS | Status: AC
  Administered 2015-01-06: 357 mg via INTRAVENOUS
  Filled 2015-01-06: qty 17

## 2015-01-06 MED ORDER — HEPARIN SOD (PORK) LOCK FLUSH 100 UNIT/ML IV SOLN
500.0000 [IU] | Freq: Once | INTRAVENOUS | Status: AC
  Administered 2015-01-06: 500 [IU] via INTRAVENOUS

## 2015-01-06 MED ORDER — SODIUM CHLORIDE 0.9 % IV SOLN
Freq: Once | INTRAVENOUS | Status: AC
  Administered 2015-01-06: 14:00:00 via INTRAVENOUS
  Filled 2015-01-06: qty 1000

## 2015-01-06 MED ORDER — DIPHENHYDRAMINE HCL 25 MG PO CAPS
25.0000 mg | ORAL_CAPSULE | Freq: Once | ORAL | Status: AC
  Administered 2015-01-06: 25 mg via ORAL
  Filled 2015-01-06: qty 1

## 2015-01-27 ENCOUNTER — Inpatient Hospital Stay: Payer: Medicare HMO

## 2015-01-27 ENCOUNTER — Inpatient Hospital Stay: Payer: Medicare HMO | Attending: Internal Medicine | Admitting: Internal Medicine

## 2015-01-27 VITALS — BP 160/82 | HR 75 | Temp 97.9°F | Ht 60.0 in | Wt 140.0 lb

## 2015-01-27 DIAGNOSIS — Z5112 Encounter for antineoplastic immunotherapy: Secondary | ICD-10-CM | POA: Insufficient documentation

## 2015-01-27 DIAGNOSIS — Z171 Estrogen receptor negative status [ER-]: Secondary | ICD-10-CM | POA: Diagnosis not present

## 2015-01-27 DIAGNOSIS — Z79899 Other long term (current) drug therapy: Secondary | ICD-10-CM | POA: Insufficient documentation

## 2015-01-27 DIAGNOSIS — E785 Hyperlipidemia, unspecified: Secondary | ICD-10-CM | POA: Diagnosis not present

## 2015-01-27 DIAGNOSIS — I10 Essential (primary) hypertension: Secondary | ICD-10-CM | POA: Insufficient documentation

## 2015-01-27 DIAGNOSIS — C50912 Malignant neoplasm of unspecified site of left female breast: Secondary | ICD-10-CM

## 2015-01-27 DIAGNOSIS — R978 Other abnormal tumor markers: Secondary | ICD-10-CM

## 2015-01-27 DIAGNOSIS — Z8572 Personal history of non-Hodgkin lymphomas: Secondary | ICD-10-CM | POA: Diagnosis not present

## 2015-01-27 DIAGNOSIS — C50919 Malignant neoplasm of unspecified site of unspecified female breast: Secondary | ICD-10-CM

## 2015-01-27 LAB — CBC WITH DIFFERENTIAL/PLATELET
BASOS ABS: 0 10*3/uL (ref 0–0.1)
BASOS PCT: 0 %
EOS PCT: 1 %
Eosinophils Absolute: 0 10*3/uL (ref 0–0.7)
HCT: 35.2 % (ref 35.0–47.0)
Hemoglobin: 11.8 g/dL — ABNORMAL LOW (ref 12.0–16.0)
Lymphocytes Relative: 26 %
Lymphs Abs: 1.5 10*3/uL (ref 1.0–3.6)
MCH: 30.9 pg (ref 26.0–34.0)
MCHC: 33.5 g/dL (ref 32.0–36.0)
MCV: 92 fL (ref 80.0–100.0)
MONO ABS: 1 10*3/uL — AB (ref 0.2–0.9)
Monocytes Relative: 17 %
Neutro Abs: 3.2 10*3/uL (ref 1.4–6.5)
Neutrophils Relative %: 56 %
PLATELETS: 239 10*3/uL (ref 150–440)
RBC: 3.83 MIL/uL (ref 3.80–5.20)
RDW: 13.3 % (ref 11.5–14.5)
WBC: 5.7 10*3/uL (ref 3.6–11.0)

## 2015-01-27 LAB — BASIC METABOLIC PANEL
ANION GAP: 7 (ref 5–15)
BUN: 17 mg/dL (ref 6–20)
CALCIUM: 9 mg/dL (ref 8.9–10.3)
CO2: 31 mmol/L (ref 22–32)
Chloride: 99 mmol/L — ABNORMAL LOW (ref 101–111)
Creatinine, Ser: 0.63 mg/dL (ref 0.44–1.00)
GFR calc Af Amer: >60 mL/min (ref >60)
Glucose, Bld: 95 mg/dL (ref 65–99)
Potassium: 3.3 mmol/L — ABNORMAL LOW (ref 3.5–5.1)
Sodium: 137 mmol/L (ref 135–145)

## 2015-01-27 MED ORDER — HEPARIN SOD (PORK) LOCK FLUSH 100 UNIT/ML IV SOLN: 500.0000 [IU] | Freq: Once | INTRAVENOUS | Status: DC

## 2015-01-27 MED ORDER — SODIUM CHLORIDE 0.9 % IV SOLN
Freq: Once | INTRAVENOUS | Status: AC
  Administered 2015-01-27: 11:00:00 via INTRAVENOUS
  Filled 2015-01-27: qty 1000

## 2015-01-27 MED ORDER — SODIUM CHLORIDE 0.9 % IJ SOLN
10.0000 mL | Freq: Once | INTRAMUSCULAR | Status: AC
  Administered 2015-01-27: 10 mL via INTRAVENOUS
  Filled 2015-01-27: qty 10

## 2015-01-27 MED ORDER — DIPHENHYDRAMINE HCL 25 MG PO CAPS
25.0000 mg | ORAL_CAPSULE | Freq: Once | ORAL | Status: AC
  Administered 2015-01-27: 25 mg via ORAL
  Filled 2015-01-27: qty 1

## 2015-01-27 MED ORDER — HEPARIN SOD (PORK) LOCK FLUSH 100 UNIT/ML IV SOLN
500.0000 [IU] | Freq: Once | INTRAVENOUS | Status: AC | PRN
  Administered 2015-01-27: 500 [IU]
  Filled 2015-01-27: qty 5

## 2015-01-27 MED ORDER — TRASTUZUMAB CHEMO INJECTION 440 MG
6.0000 mg/kg | Freq: Once | INTRAVENOUS | Status: AC
  Administered 2015-01-27: 357 mg via INTRAVENOUS
  Filled 2015-01-27: qty 17

## 2015-01-27 MED ORDER — ACETAMINOPHEN 325 MG PO TABS
650.0000 mg | ORAL_TABLET | Freq: Once | ORAL | Status: AC
  Administered 2015-01-27: 650 mg via ORAL
  Filled 2015-01-27: qty 2

## 2015-01-27 NOTE — Progress Notes (Signed)
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 STABLE band like opacity in RUL/RML    # SEP21st 2016- MUGA 61 %; DEC 2016- REC MUGA scan  # LEFT UE LYMPHEDEMA s/p PT  INTERVAL HISTORY:  A pleasant 74 year old female patient with above history of stage IIB breast cancer HER-2/neu positive currently on adjuvant/maintenance Herceptin and is here for follow-up.   Patient denies any diarrhea. No unusual aches and pains. No unusual headaches. No shortness of breath or chest pain. Continues to have chronic swelling in the left upper extremity no any worse.  REVIEW OF SYSTEMS:  A complete 10 point review of system is done which is negative except mentioned above/history of present illness.   PAST MEDICAL HISTORY :  Past Medical History  Diagnosis Date  . Hypertension   . Hyperlipidemia   . Murmur   . Hyperlipemia   . Osteoporosis   . Breast cancer metastasized to axillary lymph node ([place]) August 2015    T2, N1, ER negative, PR negative, HER-2 amplified. 2 cm axillary node. One of 11 nodes positive on post-adjuvant chemotherapy axillary dissection. 3+ centimeter tumor.  . Breast cancer ([place]) 2015    left breast cancer  . MALT lymphoma ([place])     PAST SURGICAL HISTORY :   Past Surgical History  Procedure Laterality Date  . Abdominal hysterectomy    . Appendectomy    . Mastectomy Left 02-25-14    Dr Bary Castilla  . Breast surgery Left 02/25/14    Modified radical mastectomy, T2 N1.  Grade 3, ER, PR negative, HER-2/neu 3+.    FAMILY HISTORY :   Family History  Problem Relation Age of Onset  . Hypertension Mother   . Hypertension Maternal Aunt   . Hypertension Maternal Uncle   . Hypertension Maternal Aunt   . Hypertension Maternal Aunt     SOCIAL HISTORY:   Social History  Substance Use Topics  . Smoking status: Never Smoker   . Smokeless tobacco: Never Used  . Alcohol Use: No    ALLERGIES:  has No Known Allergies.  MEDICATIONS:  Current Outpatient Prescriptions  Medication Sig Dispense Refill  . alendronate (FOSAMAX) 70 MG tablet Take 70 mg by mouth once a week. Take with a full glass of water on an empty stomach.    Marland Kitchen amLODipine (NORVASC) 2.5 MG tablet Take 2.5 mg by mouth daily.    Marland Kitchen aspirin 81 MG tablet Take 81 mg by mouth daily.    . cholecalciferol (VITAMIN D) 1000 UNITS tablet Take 1,000 Units by mouth daily.    Marland Kitchen losartan-hydrochlorothiazide (HYZAAR) 100-25 MG per tablet Take 1 tablet by mouth daily.    . Multiple Vitamins-Minerals (MULTIVITAMIN WITH MINERALS) tablet Take 1 tablet by mouth daily.    . simvastatin (ZOCOR) 40 MG tablet Take 40 mg by mouth at bedtime.     No current facility-administered medications for this visit.   Facility-Administered Medications Ordered in Other Visits  Medication Dose Route Frequency Provider Last  Rate Last Dose  . sodium chloride 0.9 % injection 10 mL  10 mL Intracatheter PRN Leia Alf, MD   10 mL at 06/17/14 0930  . sodium chloride 0.9 % injection 10 mL  10 mL Intracatheter PRN Leia Alf, MD   10 mL at 08/12/14 1357    PHYSICAL EXAMINATION: ECOG PERFORMANCE STATUS: 0 - Asymptomatic  BP 160/82 mmHg  Pulse 75  Temp(Src) 97.9 F (36.6 C) (Tympanic)  Ht 5' (1.524 m)  Wt 139 lb 15.9 oz (63.5 kg)  BMI 27.34 kg/m2  Filed Weights   01/27/15 0937  Weight: 139 lb 15.9 oz (63.5 kg)    GENERAL: Well-nourished well-developed; Alert, no distress and comfortable.  With husband.  EYES: no pallor or  icterus.  OROPHARYNX: no thrush or ulceration; poor dentition.no masses felt LYMPH:  no palpable lymphadenopathy in the cervical, axillary or inguinal regions LUNGS: clear to auscultation and  No wheeze or crackles HEART/CVS: regular rate & rhythm and no murmurs; No lower extremity edema; lymphedema noted in the left upper extremity/she is wearing a sleeve.  ABDOMEN:abdomen soft, non-tender and normal bowel sounds Musculoskeletal:no cyanosis of digits and no clubbing  PSYCH: alert & oriented x 3 with fluent speech NEURO: no focal motor/sensory deficits SKIN:  no rashes or significant lesions  LABORATORY DATA:  I have reviewed the data as listed    Component Value Date/Time   NA 137 01/27/2015 0918   NA 136 05/27/2014 0942   NA 142 10/08/2013 1631   K 3.3* 01/27/2015 0918   K 3.4* 05/27/2014 0942   CL 99* 01/27/2015 0918   CL 102 05/27/2014 0942   CO2 31 01/27/2015 0918   CO2 29 05/27/2014 0942   GLUCOSE 95 01/27/2015 0918   GLUCOSE 106* 05/27/2014 0942   GLUCOSE 110* 10/08/2013 1631   BUN 17 01/27/2015 0918   BUN 13 05/27/2014 0942   BUN 10 10/08/2013 1631   CREATININE 0.63 01/27/2015 0918   CREATININE 0.58 05/27/2014 0942   CALCIUM 9.0 01/27/2015 0918   CALCIUM 8.6* 05/27/2014 0942   PROT 7.4 12/16/2014 1344   PROT 6.8 05/27/2014 0942   PROT 7.9 10/08/2013 1631   ALBUMIN 4.0 12/16/2014 1344   ALBUMIN 3.8 05/27/2014 0942   ALBUMIN 4.5 10/08/2013 1631   AST 34 12/16/2014 1344   AST 18 05/27/2014 0942   ALT 16 12/16/2014 1344   ALT 14 05/27/2014 0942   ALKPHOS 61 12/16/2014 1344   ALKPHOS 69 05/27/2014 0942   BILITOT 0.6 12/16/2014 1344   BILITOT 0.6 05/27/2014 0942   GFRNONAA >60 01/27/2015 0918   GFRNONAA >60 05/27/2014 0942   GFRNONAA >60 01/26/2014 0943   GFRAA >60 01/27/2015 0918   GFRAA >60 05/27/2014 0942   GFRAA >60 01/26/2014 0943    No results found for: SPEP, UPEP  Lab Results  Component Value Date   WBC 5.7 01/27/2015   NEUTROABS 3.2 01/27/2015    HGB 11.8* 01/27/2015   HCT 35.2 01/27/2015   MCV 92.0 01/27/2015   PLT 239 01/27/2015      Chemistry      Component Value Date/Time   NA 137 01/27/2015 0918   NA 136 05/27/2014 0942   NA 142 10/08/2013 1631   K 3.3* 01/27/2015 0918   K 3.4* 05/27/2014 0942   CL 99* 01/27/2015 0918   CL 102 05/27/2014 0942   CO2 31 01/27/2015 0918   CO2 29 05/27/2014 0942   BUN 17 01/27/2015 0918   BUN 13 05/27/2014 0942  BUN 10 10/08/2013 1631   CREATININE 0.63 01/27/2015 0918   CREATININE 0.58 05/27/2014 0942      Component Value Date/Time   CALCIUM 9.0 01/27/2015 0918   CALCIUM 8.6* 05/27/2014 0942   ALKPHOS 61 12/16/2014 1344   ALKPHOS 69 05/27/2014 0942   AST 34 12/16/2014 1344   AST 18 05/27/2014 0942   ALT 16 12/16/2014 1344   ALT 14 05/27/2014 0942   BILITOT 0.6 12/16/2014 1344   BILITOT 0.6 05/27/2014 0942       RADIOGRAPHIC STUDIES: I have personally reviewed the radiological images as listed and agreed with the findings in the report. No results found.   ASSESSMENT & PLAN:   # Stage II ER negative PR weak HER-2/neu positive breast cancer currently on adjuvant/maintenance Herceptin. Patient tolerating Herceptin without any major side effects.  Patient is due to get Herceptin from end of February/March 2017  #  Elevated tumor marker CA 2729-  Slightly up at 46 in November 2016;  We will recheck the tumor markers in approximately 6 weeks. If continues to elevated then recommend scans. I reviewed this with the patient.  # History of MALT lymphoma of the lung- clinically no evidence of progression noted.  most recent scan August 2015 no evidence of progression.   #  We will get a MUGA scan-  In the next month or so as;  Based on the results-  Continue with Herceptin.   # Follow-up with me in approximately 6 weeks with labs CBC BMP.   All questions were answered. The patient knows to call the clinic with any problems, questions or concerns. No barriers to learning was  detected.  # 25 minutes face-to-face with the patient discussing the above plan of care; more than 50% of time spent on prognosis/ natural history; counseling and coordination.      Cammie Sickle, MD 01/27/2015 4:52 PM

## 2015-02-02 ENCOUNTER — Ambulatory Visit (INDEPENDENT_AMBULATORY_CARE_PROVIDER_SITE_OTHER): Payer: Medicare HMO | Admitting: General Surgery

## 2015-02-02 ENCOUNTER — Encounter: Payer: Self-pay | Admitting: General Surgery

## 2015-02-02 VITALS — BP 150/70 | HR 64 | Resp 14 | Ht 60.0 in | Wt 140.0 lb

## 2015-02-02 DIAGNOSIS — C50912 Malignant neoplasm of unspecified site of left female breast: Secondary | ICD-10-CM | POA: Diagnosis not present

## 2015-02-02 DIAGNOSIS — I890 Lymphedema, not elsewhere classified: Secondary | ICD-10-CM | POA: Diagnosis not present

## 2015-02-02 NOTE — Progress Notes (Addendum)
Patient ID: Andrea Rodgers, female   DOB: 1941-01-07, 74 y.o.   MRN: [id number]  Chief Complaint  Patient presents with  . Follow-up    HPI Andrea Rodgers is a 74 y.o. female.  here todty for her follow up breast cancer check. Patient states she is doing well. She is wearing her lymphedema sleeves and states it seems to be helping. She also uses a sequential compression device twice a day. No new issues.  I personally reviewed the patient's history.  HPI  Past Medical History  Diagnosis Date  . Hypertension   . Hyperlipidemia   . Murmur   . Hyperlipemia   . Osteoporosis   . Breast cancer metastasized to axillary lymph node ([place]) August 2015    T2, N1, ER negative, PR negative, HER-2 amplified. 2 cm axillary node. One of 11 nodes positive on post-adjuvant chemotherapy axillary dissection. 3+ centimeter tumor.  . Breast cancer ([place]) 2015    left breast cancer  . MALT lymphoma ([place])   . Lymphedema of upper extremity following lymphadenectomy August 2015    Left upper extremity.    Past Surgical History  Procedure Laterality Date  . Abdominal hysterectomy    . Appendectomy    . Mastectomy Left 02-25-14    Dr Bary Castilla  . Breast surgery Left 02/25/14    Modified radical mastectomy, T2 N1. Grade 3, ER, PR negative, HER-2/neu 3+.    Family History  Problem Relation Age of Onset  . Hypertension Mother   . Hypertension Maternal Aunt   . Hypertension Maternal Uncle   . Hypertension Maternal Aunt   . Hypertension Maternal Aunt     Social History Social History  Substance Use Topics  . Smoking status: Never Smoker   . Smokeless tobacco: Never Used  . Alcohol Use: No    No Known Allergies  Current Outpatient Prescriptions  Medication Sig Dispense Refill  . alendronate (FOSAMAX) 70 MG tablet Take 70 mg by mouth once a week. Take with a full glass of water on an empty stomach.    Marland Kitchen amLODipine (NORVASC) 2.5 MG tablet Take 2.5 mg by mouth daily.    Marland Kitchen aspirin 81 MG tablet Take  81 mg by mouth daily.    . cholecalciferol (VITAMIN D) 1000 UNITS tablet Take 1,000 Units by mouth daily.    Marland Kitchen losartan-hydrochlorothiazide (HYZAAR) 100-25 MG per tablet Take 1 tablet by mouth daily.    . Multiple Vitamins-Minerals (MULTIVITAMIN WITH MINERALS) tablet Take 1 tablet by mouth daily.    . simvastatin (ZOCOR) 40 MG tablet Take 40 mg by mouth at bedtime.     No current facility-administered medications for this visit.   Facility-Administered Medications Ordered in Other Visits  Medication Dose Route Frequency Provider Last Rate Last Dose  . sodium chloride 0.9 % injection 10 mL  10 mL Intracatheter PRN Leia Alf, MD   10 mL at 06/17/14 0930  . sodium chloride 0.9 % injection 10 mL  10 mL Intracatheter PRN Leia Alf, MD   10 mL at 08/12/14 1357    Review of Systems Review of Systems  Constitutional: Negative.   Respiratory: Negative.   Cardiovascular: Negative.     Blood pressure 150/70, pulse 64, resp. rate 14, height 5' (1.524 m), weight 140 lb (63.504 kg).  Physical Exam Physical Exam  Constitutional: She is oriented to person, place, and time. She appears well-developed and well-nourished.  Eyes: Conjunctivae are normal. No scleral icterus.  Neck: Neck supple.  Cardiovascular: Normal rate  and regular rhythm.   Murmur heard.  Systolic murmur is present with a grade of 2/6  Pulmonary/Chest: Effort normal and breath sounds normal. Right breast exhibits no inverted nipple, no mass, no nipple discharge, no skin change and no tenderness.  Left mastectomy site is clean and well healed.  Lymphadenopathy:    She has no cervical adenopathy.    She has no axillary adenopathy.  Neurological: She is alert and oriented to person, place, and time.  Skin: Skin is warm and dry.   Examination of the upper extremity showed mild to moderate lymphedema. Measurements of the location 15 cm superior as well as 10 and 20 cm inferior to the olecranon process were obtained.    Measurements on the right: 30, 26.5, 19 centimeters respectively.  Measurements in June 2016: 29, 26, 18 cm. Respectfully.  Measurements on the left: 33.5, 31 and 21 cm respectively.  Measurements in June 2016: 32, 28 and 21 cm.   Data Reviewed 10/08/2014 mammograms and chest CT were reviewed. No evidence of recurrent disease.  Assessment    Doing well status post mastectomy and axillary dissection, moderate progression of left upper extremity lymphedema.    Plan    The medical oncology service has been arranging for her mammograms. We will plan to see the patient in late 2017.  We'll confirm that the patient has had a colonoscopy in the appropriate interval.    Patient to return in one year breast check.  PCP:  Benita Stabile   This information has been scribed by Karie Fetch Nicholson.  [name] 02/03/2015, 9:44 PM

## 2015-02-02 NOTE — Patient Instructions (Addendum)
The patient is aware to call back for any questions or concerns. Patient to return in one year breast check

## 2015-02-03 ENCOUNTER — Encounter: Payer: Self-pay | Admitting: General Surgery

## 2015-02-04 ENCOUNTER — Encounter
Admission: RE | Admit: 2015-02-04 | Discharge: 2015-02-04 | Disposition: A | Discharge status: Home / Self Care | Payer: Medicare HMO | Source: Ambulatory Visit | Attending: Internal Medicine | Admitting: Internal Medicine

## 2015-02-04 DIAGNOSIS — C50912 Malignant neoplasm of unspecified site of left female breast: Secondary | ICD-10-CM | POA: Insufficient documentation

## 2015-02-04 MED ORDER — TECHNETIUM TC 99M-LABELED RED BLOOD CELLS IV KIT
20.0000 | PACK | Freq: Once | INTRAVENOUS | Status: AC | PRN
  Administered 2015-02-04: 19.12 via INTRAVENOUS

## 2015-02-17 ENCOUNTER — Other Ambulatory Visit: Payer: Self-pay | Admitting: Family Medicine

## 2015-02-17 ENCOUNTER — Inpatient Hospital Stay: Payer: Medicare HMO | Attending: Internal Medicine

## 2015-02-17 ENCOUNTER — Ambulatory Visit: Payer: Medicare HMO

## 2015-02-17 ENCOUNTER — Encounter: Payer: Self-pay | Admitting: Oncology

## 2015-02-17 DIAGNOSIS — C50412 Malignant neoplasm of upper-outer quadrant of left female breast: Secondary | ICD-10-CM | POA: Insufficient documentation

## 2015-02-17 DIAGNOSIS — Z171 Estrogen receptor negative status [ER-]: Secondary | ICD-10-CM | POA: Diagnosis not present

## 2015-02-17 DIAGNOSIS — I972 Postmastectomy lymphedema syndrome: Secondary | ICD-10-CM | POA: Insufficient documentation

## 2015-02-17 DIAGNOSIS — C50919 Malignant neoplasm of unspecified site of unspecified female breast: Secondary | ICD-10-CM

## 2015-02-17 DIAGNOSIS — M810 Age-related osteoporosis without current pathological fracture: Secondary | ICD-10-CM | POA: Insufficient documentation

## 2015-02-17 DIAGNOSIS — Z9012 Acquired absence of left breast and nipple: Secondary | ICD-10-CM | POA: Diagnosis not present

## 2015-02-17 DIAGNOSIS — E785 Hyperlipidemia, unspecified: Secondary | ICD-10-CM | POA: Diagnosis not present

## 2015-02-17 DIAGNOSIS — Z5112 Encounter for antineoplastic immunotherapy: Secondary | ICD-10-CM | POA: Insufficient documentation

## 2015-02-17 DIAGNOSIS — I10 Essential (primary) hypertension: Secondary | ICD-10-CM | POA: Insufficient documentation

## 2015-02-17 DIAGNOSIS — C773 Secondary and unspecified malignant neoplasm of axilla and upper limb lymph nodes: Secondary | ICD-10-CM | POA: Diagnosis not present

## 2015-02-17 DIAGNOSIS — Z79899 Other long term (current) drug therapy: Secondary | ICD-10-CM | POA: Diagnosis not present

## 2015-02-17 MED ORDER — HEPARIN SOD (PORK) LOCK FLUSH 100 UNIT/ML IV SOLN
500.0000 [IU] | Freq: Once | INTRAVENOUS | Status: AC | PRN
  Administered 2015-02-17: 500 [IU]
  Filled 2015-02-17: qty 5

## 2015-02-17 MED ORDER — ACETAMINOPHEN 325 MG PO TABS
650.0000 mg | ORAL_TABLET | Freq: Once | ORAL | Status: AC
  Administered 2015-02-17: 650 mg via ORAL
  Filled 2015-02-17: qty 2

## 2015-02-17 MED ORDER — TRASTUZUMAB CHEMO INJECTION 440 MG
6.0000 mg/kg | Freq: Once | INTRAVENOUS | Status: AC
  Administered 2015-02-17: 357 mg via INTRAVENOUS
  Filled 2015-02-17: qty 17

## 2015-02-17 MED ORDER — SODIUM CHLORIDE 0.9 % IV SOLN
Freq: Once | INTRAVENOUS | Status: AC
  Administered 2015-02-17: 15:00:00 via INTRAVENOUS
  Filled 2015-02-17: qty 1000

## 2015-03-10 ENCOUNTER — Encounter: Payer: Self-pay | Admitting: *Deleted

## 2015-03-10 ENCOUNTER — Ambulatory Visit: Payer: Medicare HMO | Admitting: Internal Medicine

## 2015-03-10 ENCOUNTER — Inpatient Hospital Stay (HOSPITAL_BASED_OUTPATIENT_CLINIC_OR_DEPARTMENT_OTHER): Payer: Medicare HMO | Admitting: Internal Medicine

## 2015-03-10 ENCOUNTER — Inpatient Hospital Stay: Payer: Medicare HMO

## 2015-03-10 VITALS — BP 186/78 | HR 74 | Temp 97.2°F | Resp 18 | Ht 60.0 in | Wt 139.8 lb

## 2015-03-10 DIAGNOSIS — Z79899 Other long term (current) drug therapy: Secondary | ICD-10-CM

## 2015-03-10 DIAGNOSIS — C50912 Malignant neoplasm of unspecified site of left female breast: Secondary | ICD-10-CM

## 2015-03-10 DIAGNOSIS — Z171 Estrogen receptor negative status [ER-]: Secondary | ICD-10-CM | POA: Diagnosis not present

## 2015-03-10 DIAGNOSIS — C50412 Malignant neoplasm of upper-outer quadrant of left female breast: Secondary | ICD-10-CM | POA: Diagnosis not present

## 2015-03-10 DIAGNOSIS — I972 Postmastectomy lymphedema syndrome: Secondary | ICD-10-CM

## 2015-03-10 DIAGNOSIS — C773 Secondary and unspecified malignant neoplasm of axilla and upper limb lymph nodes: Secondary | ICD-10-CM | POA: Diagnosis not present

## 2015-03-10 DIAGNOSIS — Z9012 Acquired absence of left breast and nipple: Secondary | ICD-10-CM

## 2015-03-10 DIAGNOSIS — Z5112 Encounter for antineoplastic immunotherapy: Secondary | ICD-10-CM | POA: Diagnosis not present

## 2015-03-10 DIAGNOSIS — C50919 Malignant neoplasm of unspecified site of unspecified female breast: Secondary | ICD-10-CM

## 2015-03-10 LAB — CBC WITH DIFFERENTIAL/PLATELET
BASOS PCT: 0 %
Basophils Absolute: 0 10*3/uL (ref 0–0.1)
EOS ABS: 0 10*3/uL (ref 0–0.7)
Eosinophils Relative: 1 %
HCT: 35.6 % (ref 35.0–47.0)
HEMOGLOBIN: 12.1 g/dL (ref 12.0–16.0)
Lymphocytes Relative: 35 %
Lymphs Abs: 1.6 10*3/uL (ref 1.0–3.6)
MCH: 31.1 pg (ref 26.0–34.0)
MCHC: 34 g/dL (ref 32.0–36.0)
MCV: 91.2 fL (ref 80.0–100.0)
Monocytes Absolute: 0.7 10*3/uL (ref 0.2–0.9)
Monocytes Relative: 16 %
NEUTROS PCT: 48 %
Neutro Abs: 2.2 10*3/uL (ref 1.4–6.5)
Platelets: 217 10*3/uL (ref 150–440)
RBC: 3.9 MIL/uL (ref 3.80–5.20)
RDW: 12.8 % (ref 11.5–14.5)
WBC: 4.6 10*3/uL (ref 3.6–11.0)

## 2015-03-10 LAB — COMPREHENSIVE METABOLIC PANEL
ALBUMIN: 3.9 g/dL (ref 3.5–5.0)
ALK PHOS: 64 U/L (ref 38–126)
ALT: 13 U/L — AB (ref 14–54)
AST: 15 U/L (ref 15–41)
Anion gap: 5 (ref 5–15)
BUN: 14 mg/dL (ref 6–20)
CALCIUM: 8.8 mg/dL — AB (ref 8.9–10.3)
CO2: 31 mmol/L (ref 22–32)
CREATININE: 0.58 mg/dL (ref 0.44–1.00)
Chloride: 100 mmol/L — ABNORMAL LOW (ref 101–111)
GFR calc Af Amer: >60 mL/min (ref >60)
GFR calc non Af Amer: >60 mL/min (ref >60)
GLUCOSE: 95 mg/dL (ref 65–99)
Potassium: 3.1 mmol/L — ABNORMAL LOW (ref 3.5–5.1)
SODIUM: 136 mmol/L (ref 135–145)
Total Bilirubin: 0.4 mg/dL (ref 0.3–1.2)
Total Protein: 7.1 g/dL (ref 6.5–8.1)

## 2015-03-10 MED ORDER — HEPARIN SOD (PORK) LOCK FLUSH 100 UNIT/ML IV SOLN
500.0000 [IU] | Freq: Once | INTRAVENOUS | Status: AC | PRN
  Administered 2015-03-10: 500 [IU]
  Filled 2015-03-10: qty 5

## 2015-03-10 MED ORDER — SODIUM CHLORIDE 0.9 % IV SOLN
Freq: Once | INTRAVENOUS | Status: AC
  Administered 2015-03-10: 14:00:00 via INTRAVENOUS
  Filled 2015-03-10: qty 1000

## 2015-03-10 MED ORDER — TRASTUZUMAB CHEMO INJECTION 440 MG
6.0000 mg/kg | Freq: Once | INTRAVENOUS | Status: AC
  Administered 2015-03-10: 357 mg via INTRAVENOUS
  Filled 2015-03-10: qty 17

## 2015-03-10 MED ORDER — DIPHENHYDRAMINE HCL 25 MG PO CAPS
25.0000 mg | ORAL_CAPSULE | Freq: Once | ORAL | Status: AC
  Administered 2015-03-10: 25 mg via ORAL
  Filled 2015-03-10: qty 1

## 2015-03-10 MED ORDER — SODIUM CHLORIDE 0.9 % IJ SOLN
10.0000 mL | INTRAMUSCULAR | Status: DC | PRN
  Administered 2015-03-10: 10 mL
  Filled 2015-03-10: qty 10

## 2015-03-10 MED ORDER — ACETAMINOPHEN 325 MG PO TABS
650.0000 mg | ORAL_TABLET | Freq: Once | ORAL | Status: AC
  Administered 2015-03-10: 650 mg via ORAL
  Filled 2015-03-10: qty 2

## 2015-03-10 NOTE — Progress Notes (Signed)
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  She denies any abdominal pain nausea vomiting.  Patient denies any diarrhea. No unusual aches and pains. No unusual headaches. No shortness of breath or chest pain. Continues to have chronic swelling in the left upper extremity no any worse.  REVIEW OF SYSTEMS:  A complete 10 point review of system is done which is negative except mentioned above/history of present illness.   PAST MEDICAL HISTORY :  Past Medical History  Diagnosis Date  . Hypertension   . Hyperlipidemia   . Murmur   . Hyperlipemia   . Osteoporosis   . Breast cancer metastasized to axillary lymph node ([place]) August 2015    T2, N1, ER negative, PR negative, HER-2 amplified. 2 cm axillary node. One of 11 nodes positive on post-adjuvant chemotherapy axillary dissection. 3+ centimeter tumor.  . Breast cancer ([place]) 2015    left breast cancer  . MALT lymphoma ([place])   . Lymphedema of upper extremity following lymphadenectomy August 2015    Left upper extremity.    PAST SURGICAL HISTORY :   Past Surgical History  Procedure Laterality Date   . Abdominal hysterectomy    . Appendectomy    . Mastectomy Left 02-25-14    Dr Bary Castilla  . Breast surgery Left 02/25/14    Modified radical mastectomy, T2 N1. Grade 3, ER, PR negative, HER-2/neu 3+.    FAMILY HISTORY :   Family History  Problem Relation Age of Onset  . Hypertension Mother   . Hypertension Maternal Aunt   . Hypertension Maternal Uncle   . Hypertension Maternal Aunt   . Hypertension Maternal Aunt     SOCIAL HISTORY:   Social History  Substance Use Topics  . Smoking status: Never Smoker   . Smokeless tobacco: Never Used  . Alcohol Use: No    ALLERGIES:  has No Known Allergies.  MEDICATIONS:  Current Outpatient Prescriptions  Medication Sig Dispense Refill  . alendronate (FOSAMAX) 70 MG tablet Take 70 mg by mouth once a week. Take with a full glass of water on an empty stomach.    Marland Kitchen amLODipine (NORVASC) 2.5 MG tablet Take 2.5 mg by mouth daily.    Marland Kitchen aspirin 81 MG tablet Take 81 mg by mouth daily.    . cholecalciferol (VITAMIN D) 1000 UNITS tablet Take 1,000 Units by mouth daily.    Marland Kitchen lidocaine (XYLOCAINE) 2 % jelly AS DIRECTED WITH CHEMO  2  . losartan-hydrochlorothiazide (HYZAAR) 100-25 MG per tablet Take 1 tablet by mouth daily.    . Multiple Vitamins-Minerals (MULTIVITAMIN WITH MINERALS) tablet Take 1 tablet by mouth daily.    Marland Kitchen  simvastatin (ZOCOR) 40 MG tablet Take 40 mg by mouth at bedtime.     No current facility-administered medications for this visit.   Facility-Administered Medications Ordered in Other Visits  Medication Dose Route Frequency Provider Last Rate Last Dose  . sodium chloride 0.9 % injection 10 mL  10 mL Intracatheter PRN Leia Alf, MD   10 mL at 06/17/14 0930  . sodium chloride 0.9 % injection 10 mL  10 mL Intracatheter PRN Leia Alf, MD   10 mL at 08/12/14 1357  . sodium chloride 0.9 % injection 10 mL  10 mL Intracatheter PRN Cammie Sickle, MD   10 mL at 03/10/15 1318    PHYSICAL EXAMINATION: ECOG PERFORMANCE  STATUS: 0 - Asymptomatic  BP 186/78 mmHg  Pulse 74  Temp(Src) 97.2 F (36.2 C) (Tympanic)  Resp 18  Ht 5' (1.524 m)  Wt 139 lb 12.4 oz (63.4 kg)  BMI 27.30 kg/m2  Filed Weights   03/10/15 1323  Weight: 139 lb 12.4 oz (63.4 kg)    GENERAL: Well-nourished well-developed; Alert, no distress and comfortable.  With daughter EYES: no pallor or icterus.  OROPHARYNX: no thrush or ulceration; poor dentition.no masses felt LYMPH:  no palpable lymphadenopathy in the cervical, axillary or inguinal regions LUNGS: clear to auscultation and  No wheeze or crackles HEART/CVS: regular rate & rhythm and no murmurs; No lower extremity edema; lymphedema noted in the left upper extremity/she is wearing a sleeve.  ABDOMEN:abdomen soft, non-tender and normal bowel sounds Musculoskeletal:no cyanosis of digits and no clubbing  PSYCH: alert & oriented x 3 with fluent speech NEURO: no focal motor/sensory deficits SKIN:  no rashes or significant lesions  LABORATORY DATA:  I have reviewed the data as listed    Component Value Date/Time   NA 137 01/27/2015 0918   NA 136 05/27/2014 0942   NA 142 10/08/2013 1631   K 3.3* 01/27/2015 0918   K 3.4* 05/27/2014 0942   CL 99* 01/27/2015 0918   CL 102 05/27/2014 0942   CO2 31 01/27/2015 0918   CO2 29 05/27/2014 0942   GLUCOSE 95 01/27/2015 0918   GLUCOSE 106* 05/27/2014 0942   GLUCOSE 110* 10/08/2013 1631   BUN 17 01/27/2015 0918   BUN 13 05/27/2014 0942   BUN 10 10/08/2013 1631   CREATININE 0.63 01/27/2015 0918   CREATININE 0.58 05/27/2014 0942   CALCIUM 9.0 01/27/2015 0918   CALCIUM 8.6* 05/27/2014 0942   PROT 7.4 12/16/2014 1344   PROT 6.8 05/27/2014 0942   PROT 7.9 10/08/2013 1631   ALBUMIN 4.0 12/16/2014 1344   ALBUMIN 3.8 05/27/2014 0942   ALBUMIN 4.5 10/08/2013 1631   AST 34 12/16/2014 1344   AST 18 05/27/2014 0942   ALT 16 12/16/2014 1344   ALT 14 05/27/2014 0942   ALKPHOS 61 12/16/2014 1344   ALKPHOS 69 05/27/2014 0942   BILITOT 0.6  12/16/2014 1344   BILITOT 0.6 05/27/2014 0942   GFRNONAA >60 01/27/2015 0918   GFRNONAA >60 05/27/2014 0942   GFRNONAA >60 01/26/2014 0943   GFRAA >60 01/27/2015 0918   GFRAA >60 05/27/2014 0942   GFRAA >60 01/26/2014 0943    No results found for: SPEP, UPEP  Lab Results  Component Value Date   WBC 4.6 03/10/2015   NEUTROABS 2.2 03/10/2015   HGB 12.1 03/10/2015   HCT 35.6 03/10/2015   MCV 91.2 03/10/2015   PLT 217 03/10/2015      Chemistry      Component Value Date/Time  NA 137 01/27/2015 0918   NA 136 05/27/2014 0942   NA 142 10/08/2013 1631   K 3.3* 01/27/2015 0918   K 3.4* 05/27/2014 0942   CL 99* 01/27/2015 0918   CL 102 05/27/2014 0942   CO2 31 01/27/2015 0918   CO2 29 05/27/2014 0942   BUN 17 01/27/2015 0918   BUN 13 05/27/2014 0942   BUN 10 10/08/2013 1631   CREATININE 0.63 01/27/2015 0918   CREATININE 0.58 05/27/2014 0942      Component Value Date/Time   CALCIUM 9.0 01/27/2015 0918   CALCIUM 8.6* 05/27/2014 0942   ALKPHOS 61 12/16/2014 1344   ALKPHOS 69 05/27/2014 0942   AST 34 12/16/2014 1344   AST 18 05/27/2014 0942   ALT 16 12/16/2014 1344   ALT 14 05/27/2014 0942   BILITOT 0.6 12/16/2014 1344   BILITOT 0.6 05/27/2014 0942       RADIOGRAPHIC STUDIES: I have personally reviewed the radiological images as listed and agreed with the findings in the report. No results found.   ASSESSMENT & PLAN:   # Stage II ER negative PR weak HER-2/neu positive breast cancer currently on adjuvant/maintenance Herceptin. Patient tolerating Herceptin without any major side effects. Patient has 2 more treatments to go; including the one from today. I also discussed the eqivocal role of aromatase inhibitors  #  Elevated tumor marker CA 2729-  Slightly up at 46 in November 2016; If continues to elevated then recommend scans. I reviewed this with the patient/daughter  # History of MALT lymphoma of the lung- clinically no evidence of progression noted.  most recent  scan August 2015 no evidence of progression.   # MUGA scan-December 22-ejection fraction 65%.  # Follow-up with me in approximately 6 weeks.   All questions were answered.   # 15 minutes face-to-face with the patient discussing the above plan of care; more than 50% of time spent on prognosis/ natural history; counseling and coordination.      Cammie Sickle, MD 03/10/2015 1:32 PM

## 2015-03-10 NOTE — Progress Notes (Signed)
Per MD, instructed patient to increase diet in potassium. Provided recommended food intake. Potassium is 3.1.

## 2015-03-11 ENCOUNTER — Telehealth: Payer: Self-pay | Admitting: *Deleted

## 2015-03-11 LAB — CANCER ANTIGEN 27.29: CA 27.29: 33.2 U/mL (ref 0.0–38.6)

## 2015-03-11 NOTE — Telephone Encounter (Signed)
Patient's tumor markers normal; no scans recommended at this time. Patient follow-up as planned/finish out Herceptin. We discussed regarding aromatase inhibitor pills at the next visit. Please inform pt.

## 2015-03-11 NOTE — Telephone Encounter (Signed)
Spoke with patients daughter on the phone to discuss tumor marker lab results. Tumor marker is now within normal range and daughter stated that she understood results

## 2015-03-11 NOTE — Telephone Encounter (Signed)
-----   Message from Cammie Sickle, MD sent at 03/11/2015 12:10 PM EST ----- Please inform patient that- tumor marker is normal. Follow-up as scheduled.

## 2015-03-11 NOTE — Telephone Encounter (Signed)
Spoke with patient. Results provided to patient. She asked that I also contact daughter back to discuss her lab results. I did leave a message to have daughter call cancer center to discuss her patient's results. Pt expressed gratitude to for return call.

## 2015-03-11 NOTE — Telephone Encounter (Signed)
Asking for results to see if she needs to be on medication for her cancer. Requesting a call back

## 2015-03-31 ENCOUNTER — Inpatient Hospital Stay: Payer: Medicare HMO | Attending: Internal Medicine

## 2015-03-31 VITALS — BP 133/66 | HR 76 | Resp 18

## 2015-03-31 DIAGNOSIS — C773 Secondary and unspecified malignant neoplasm of axilla and upper limb lymph nodes: Secondary | ICD-10-CM | POA: Insufficient documentation

## 2015-03-31 DIAGNOSIS — I972 Postmastectomy lymphedema syndrome: Secondary | ICD-10-CM | POA: Diagnosis not present

## 2015-03-31 DIAGNOSIS — Z9012 Acquired absence of left breast and nipple: Secondary | ICD-10-CM | POA: Insufficient documentation

## 2015-03-31 DIAGNOSIS — Z5112 Encounter for antineoplastic immunotherapy: Secondary | ICD-10-CM | POA: Insufficient documentation

## 2015-03-31 DIAGNOSIS — C50919 Malignant neoplasm of unspecified site of unspecified female breast: Secondary | ICD-10-CM

## 2015-03-31 DIAGNOSIS — C50412 Malignant neoplasm of upper-outer quadrant of left female breast: Secondary | ICD-10-CM | POA: Diagnosis not present

## 2015-03-31 DIAGNOSIS — Z79899 Other long term (current) drug therapy: Secondary | ICD-10-CM | POA: Diagnosis not present

## 2015-03-31 DIAGNOSIS — Z171 Estrogen receptor negative status [ER-]: Secondary | ICD-10-CM | POA: Insufficient documentation

## 2015-03-31 MED ORDER — SODIUM CHLORIDE 0.9 % IV SOLN
Freq: Once | INTRAVENOUS | Status: AC
  Administered 2015-03-31: 14:00:00 via INTRAVENOUS
  Filled 2015-03-31: qty 1000

## 2015-03-31 MED ORDER — ACETAMINOPHEN 325 MG PO TABS
650.0000 mg | ORAL_TABLET | Freq: Once | ORAL | Status: AC
  Administered 2015-03-31: 650 mg via ORAL
  Filled 2015-03-31: qty 2

## 2015-03-31 MED ORDER — SODIUM CHLORIDE 0.9% FLUSH
10.0000 mL | INTRAVENOUS | Status: DC | PRN
  Filled 2015-03-31: qty 10

## 2015-03-31 MED ORDER — DIPHENHYDRAMINE HCL 25 MG PO CAPS
25.0000 mg | ORAL_CAPSULE | Freq: Once | ORAL | Status: AC
  Administered 2015-03-31: 25 mg via ORAL
  Filled 2015-03-31: qty 1

## 2015-03-31 MED ORDER — TRASTUZUMAB CHEMO INJECTION 440 MG
6.0000 mg/kg | Freq: Once | INTRAVENOUS | Status: AC
  Administered 2015-03-31: 357 mg via INTRAVENOUS
  Filled 2015-03-31: qty 17

## 2015-03-31 MED ORDER — HEPARIN SOD (PORK) LOCK FLUSH 100 UNIT/ML IV SOLN
INTRAVENOUS | Status: AC
  Filled 2015-03-31: qty 5

## 2015-03-31 MED ORDER — HEPARIN SOD (PORK) LOCK FLUSH 100 UNIT/ML IV SOLN: 500.0000 [IU] | Freq: Once | INTRAVENOUS | Status: DC

## 2015-04-21 ENCOUNTER — Inpatient Hospital Stay: Payer: Medicare HMO | Attending: Internal Medicine | Admitting: Internal Medicine

## 2015-04-21 ENCOUNTER — Encounter: Payer: Self-pay | Admitting: Internal Medicine

## 2015-04-21 ENCOUNTER — Telehealth: Payer: Self-pay | Admitting: *Deleted

## 2015-04-21 VITALS — BP 166/72 | HR 80 | Temp 97.8°F | Wt 138.4 lb

## 2015-04-21 DIAGNOSIS — E785 Hyperlipidemia, unspecified: Secondary | ICD-10-CM | POA: Diagnosis not present

## 2015-04-21 DIAGNOSIS — C773 Secondary and unspecified malignant neoplasm of axilla and upper limb lymph nodes: Secondary | ICD-10-CM | POA: Diagnosis not present

## 2015-04-21 DIAGNOSIS — Z9012 Acquired absence of left breast and nipple: Secondary | ICD-10-CM | POA: Diagnosis not present

## 2015-04-21 DIAGNOSIS — C884 Extranodal marginal zone B-cell lymphoma of mucosa-associated lymphoid tissue [MALT-lymphoma]: Secondary | ICD-10-CM | POA: Insufficient documentation

## 2015-04-21 DIAGNOSIS — Z8572 Personal history of non-Hodgkin lymphomas: Secondary | ICD-10-CM | POA: Diagnosis not present

## 2015-04-21 DIAGNOSIS — Z79899 Other long term (current) drug therapy: Secondary | ICD-10-CM | POA: Insufficient documentation

## 2015-04-21 DIAGNOSIS — I10 Essential (primary) hypertension: Secondary | ICD-10-CM | POA: Insufficient documentation

## 2015-04-21 DIAGNOSIS — C50912 Malignant neoplasm of unspecified site of left female breast: Secondary | ICD-10-CM

## 2015-04-21 DIAGNOSIS — C50919 Malignant neoplasm of unspecified site of unspecified female breast: Secondary | ICD-10-CM | POA: Insufficient documentation

## 2015-04-21 DIAGNOSIS — Z171 Estrogen receptor negative status [ER-]: Secondary | ICD-10-CM | POA: Diagnosis not present

## 2015-04-21 DIAGNOSIS — I972 Postmastectomy lymphedema syndrome: Secondary | ICD-10-CM | POA: Insufficient documentation

## 2015-04-21 DIAGNOSIS — Z7982 Long term (current) use of aspirin: Secondary | ICD-10-CM | POA: Diagnosis not present

## 2015-04-21 DIAGNOSIS — C50412 Malignant neoplasm of upper-outer quadrant of left female breast: Secondary | ICD-10-CM | POA: Diagnosis not present

## 2015-04-21 MED ORDER — ANASTROZOLE 1 MG PO TABS: 1.0000 mg | ORAL_TABLET | Freq: Every day | ORAL | Status: DC

## 2015-04-21 NOTE — Telephone Encounter (Signed)
Pt getting ready to start AI for breast cancer

## 2015-04-21 NOTE — Progress Notes (Signed)
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 STABLE band like opacity in RUL/RML    # SEP21st 2016- MUGA 61 %; DEC 22nd 2016- 65%  # LEFT UE LYMPHEDEMA s/p PT  INTERVAL HISTORY:  A pleasant 75 year old female patient with above history of stage IIB breast cancer HER-2/neu positive currently s/p adjuvant/maintenance Herceptin and is here for follow-up.  In general appetite is good. She is very active. She denies any unusual shortness of breath or cough. No lumps or bumps. She continues to have swelling in the left upper extremity which is improved.  Intermittent hot flashes.   REVIEW OF SYSTEMS:  A complete 10 point review of system is done which is negative except mentioned above/history of present illness.   PAST MEDICAL HISTORY :  Past Medical History  Diagnosis Date  . Hypertension   . Hyperlipidemia   . Murmur   . Hyperlipemia   . Osteoporosis   . Breast cancer metastasized to axillary lymph node ([place]) August 2015    T2, N1, ER negative, PR negative, HER-2 amplified. 2 cm axillary node. One of 11 nodes positive on post-adjuvant chemotherapy axillary dissection. 3+ centimeter tumor.  . Breast cancer ([place]) 2015    left breast cancer  . MALT lymphoma ([place])   . Lymphedema of upper extremity following lymphadenectomy August 2015    Left upper extremity.    PAST SURGICAL HISTORY :   Past Surgical History  Procedure  Laterality Date  . Abdominal hysterectomy    . Appendectomy    . Mastectomy Left 02-25-14    Dr Bary Castilla  . Breast surgery Left 02/25/14    Modified radical mastectomy, T2 N1. Grade 3, ER, PR negative, HER-2/neu 3+.    FAMILY HISTORY :   Family History  Problem Relation Age of Onset  . Hypertension Mother   . Hypertension Maternal Aunt   . Hypertension Maternal Uncle   . Hypertension Maternal Aunt   . Hypertension Maternal Aunt     SOCIAL HISTORY:   Social History  Substance Use Topics  . Smoking status: Never Smoker   . Smokeless tobacco: Never Used  . Alcohol Use: No    ALLERGIES:  has No Known Allergies.  MEDICATIONS:  Current Outpatient Prescriptions  Medication Sig Dispense Refill  . alendronate (FOSAMAX) 70 MG tablet Take 70 mg by mouth once a week. Take with a full glass of water on an empty stomach.    Marland Kitchen amLODipine (NORVASC) 2.5 MG tablet Take 2.5 mg by mouth daily.    Marland Kitchen aspirin 81 MG tablet Take 81 mg by mouth daily.    . cholecalciferol (VITAMIN D) 1000 UNITS tablet Take 1,000 Units by mouth daily.    Marland Kitchen lidocaine (XYLOCAINE) 2 % jelly AS DIRECTED WITH CHEMO  2  . losartan-hydrochlorothiazide (HYZAAR) 100-25 MG per tablet Take 1 tablet by mouth daily.    . Multiple Vitamins-Minerals (MULTIVITAMIN WITH MINERALS) tablet Take 1 tablet by mouth daily.    Marland Kitchen  simvastatin (ZOCOR) 40 MG tablet Take 40 mg by mouth at bedtime.     No current facility-administered medications for this visit.   Facility-Administered Medications Ordered in Other Visits  Medication Dose Route Frequency Provider Last Rate Last Dose  . sodium chloride 0.9 % injection 10 mL  10 mL Intracatheter PRN Leia Alf, MD   10 mL at 06/17/14 0930  . sodium chloride 0.9 % injection 10 mL  10 mL Intracatheter PRN Leia Alf, MD   10 mL at 08/12/14 1357    PHYSICAL EXAMINATION: ECOG PERFORMANCE STATUS: 0 - Asymptomatic  BP 166/72 mmHg  Pulse 80  Temp(Src) 97.8 F (36.6 C) (Tympanic)  Wt 138 lb  7.2 oz (62.8 kg)  Filed Weights   04/21/15 1123  Weight: 138 lb 7.2 oz (62.8 kg)    GENERAL: Well-nourished well-developed; Alert, no distress and comfortable.  With daughter EYES: no pallor or icterus.  OROPHARYNX: no thrush or ulceration; poor dentition.no masses felt LYMPH:  no palpable lymphadenopathy in the cervical, axillary or inguinal regions LUNGS: clear to auscultation and  No wheeze or crackles HEART/CVS: regular rate & rhythm and no murmurs; No lower extremity edema; lymphedema noted in the left upper extremity/she is wearing a sleeve.  ABDOMEN:abdomen soft, non-tender and normal bowel sounds Musculoskeletal:no cyanosis of digits and no clubbing  PSYCH: alert & oriented x 3 with fluent speech NEURO: no focal motor/sensory deficits SKIN:  no rashes or significant lesions  LABORATORY DATA:  I have reviewed the data as listed    Component Value Date/Time   NA 136 03/10/2015 1305   NA 136 05/27/2014 0942   NA 142 10/08/2013 1631   K 3.1* 03/10/2015 1305   K 3.4* 05/27/2014 0942   CL 100* 03/10/2015 1305   CL 102 05/27/2014 0942   CO2 31 03/10/2015 1305   CO2 29 05/27/2014 0942   GLUCOSE 95 03/10/2015 1305   GLUCOSE 106* 05/27/2014 0942   GLUCOSE 110* 10/08/2013 1631   BUN 14 03/10/2015 1305   BUN 13 05/27/2014 0942   BUN 10 10/08/2013 1631   CREATININE 0.58 03/10/2015 1305   CREATININE 0.58 05/27/2014 0942   CALCIUM 8.8* 03/10/2015 1305   CALCIUM 8.6* 05/27/2014 0942   PROT 7.1 03/10/2015 1305   PROT 6.8 05/27/2014 0942   PROT 7.9 10/08/2013 1631   ALBUMIN 3.9 03/10/2015 1305   ALBUMIN 3.8 05/27/2014 0942   ALBUMIN 4.5 10/08/2013 1631   AST 15 03/10/2015 1305   AST 18 05/27/2014 0942   ALT 13* 03/10/2015 1305   ALT 14 05/27/2014 0942   ALKPHOS 64 03/10/2015 1305   ALKPHOS 69 05/27/2014 0942   BILITOT 0.4 03/10/2015 1305   BILITOT 0.6 05/27/2014 0942   GFRNONAA >60 03/10/2015 1305   GFRNONAA >60 05/27/2014 0942   GFRNONAA >60 01/26/2014 0943   GFRAA  >60 03/10/2015 1305   GFRAA >60 05/27/2014 0942   GFRAA >60 01/26/2014 0943    No results found for: SPEP, UPEP  Lab Results  Component Value Date   WBC 4.6 03/10/2015   NEUTROABS 2.2 03/10/2015   HGB 12.1 03/10/2015   HCT 35.6 03/10/2015   MCV 91.2 03/10/2015   PLT 217 03/10/2015      Chemistry      Component Value Date/Time   NA 136 03/10/2015 1305   NA 136 05/27/2014 0942   NA 142 10/08/2013 1631   K 3.1* 03/10/2015 1305   K 3.4* 05/27/2014 0942   CL 100* 03/10/2015 1305   CL  102 05/27/2014 0942   CO2 31 03/10/2015 1305   CO2 29 05/27/2014 0942   BUN 14 03/10/2015 1305   BUN 13 05/27/2014 0942   BUN 10 10/08/2013 1631   CREATININE 0.58 03/10/2015 1305   CREATININE 0.58 05/27/2014 0942      Component Value Date/Time   CALCIUM 8.8* 03/10/2015 1305   CALCIUM 8.6* 05/27/2014 0942   ALKPHOS 64 03/10/2015 1305   ALKPHOS 69 05/27/2014 0942   AST 15 03/10/2015 1305   AST 18 05/27/2014 0942   ALT 13* 03/10/2015 1305   ALT 14 05/27/2014 0942   BILITOT 0.4 03/10/2015 1305   BILITOT 0.6 05/27/2014 0942        ASSESSMENT & PLAN:   # 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 Discussed the eqivocal role of aromatase inhibitor. I recommend a trial of Arimidex.   #  I discussed the potential side effects of Arimidex including but not limited to arthralgias, osteoporosis and hot flashes. She is recommended take calcium and vitamin D.  # Elevated tumor marker CA 2729-  Slightly up at 46 in November 2016; repeat in January 2017 normal. We will again repeat in 2 months.  # History of MALT lymphoma of the lung- clinically no evidence of progression noted.  most recent scan August 2015 no evidence of progression. Recommend a repeat CT chest abdomen pelvis/the for the next visit in 2 months.  # Osteoporosis surveillance-she is on fosomax/ vit D;  she will have a bone density test through Dr. Hall Busing.    # Follow-up with me in approximately  2 months/scans and blood work prior.  # 25 minutes face-to-face with the patient discussing the above plan of care; more than 50% of time spent on prognosis/ natural history; counseling and coordination.      Cammie Sickle, MD 04/21/2015 11:30 AM

## 2015-04-26 ENCOUNTER — Other Ambulatory Visit: Payer: Self-pay | Admitting: Internal Medicine

## 2015-04-26 DIAGNOSIS — M810 Age-related osteoporosis without current pathological fracture: Secondary | ICD-10-CM

## 2015-05-11 ENCOUNTER — Ambulatory Visit
Admission: RE | Admit: 2015-05-11 | Discharge: 2015-05-11 | Disposition: A | Discharge status: Home / Self Care | Payer: Medicare HMO | Source: Ambulatory Visit | Attending: Internal Medicine | Admitting: Internal Medicine

## 2015-05-11 DIAGNOSIS — M810 Age-related osteoporosis without current pathological fracture: Secondary | ICD-10-CM | POA: Insufficient documentation

## 2015-05-11 DIAGNOSIS — Z90710 Acquired absence of both cervix and uterus: Secondary | ICD-10-CM | POA: Diagnosis not present

## 2015-05-11 DIAGNOSIS — M8580 Other specified disorders of bone density and structure, unspecified site: Secondary | ICD-10-CM | POA: Insufficient documentation

## 2015-06-18 ENCOUNTER — Encounter: Payer: Self-pay | Admitting: *Deleted

## 2015-06-18 NOTE — Progress Notes (Signed)
FMLA papers completed per patient request. Pending md signature.

## 2015-06-21 ENCOUNTER — Inpatient Hospital Stay: Payer: Medicare HMO

## 2015-06-21 ENCOUNTER — Ambulatory Visit
Admission: RE | Admit: 2015-06-21 | Discharge: 2015-06-21 | Disposition: A | Discharge status: Home / Self Care | Payer: Medicare HMO | Source: Ambulatory Visit | Attending: Internal Medicine | Admitting: Internal Medicine

## 2015-06-21 DIAGNOSIS — Z171 Estrogen receptor negative status [ER-]: Secondary | ICD-10-CM | POA: Insufficient documentation

## 2015-06-21 DIAGNOSIS — M8580 Other specified disorders of bone density and structure, unspecified site: Secondary | ICD-10-CM | POA: Diagnosis not present

## 2015-06-21 DIAGNOSIS — E785 Hyperlipidemia, unspecified: Secondary | ICD-10-CM | POA: Diagnosis not present

## 2015-06-21 DIAGNOSIS — Z79811 Long term (current) use of aromatase inhibitors: Secondary | ICD-10-CM | POA: Insufficient documentation

## 2015-06-21 DIAGNOSIS — J479 Bronchiectasis, uncomplicated: Secondary | ICD-10-CM | POA: Diagnosis not present

## 2015-06-21 DIAGNOSIS — C50912 Malignant neoplasm of unspecified site of left female breast: Secondary | ICD-10-CM

## 2015-06-21 DIAGNOSIS — I890 Lymphedema, not elsewhere classified: Secondary | ICD-10-CM | POA: Insufficient documentation

## 2015-06-21 DIAGNOSIS — Z7982 Long term (current) use of aspirin: Secondary | ICD-10-CM | POA: Diagnosis not present

## 2015-06-21 DIAGNOSIS — R978 Other abnormal tumor markers: Secondary | ICD-10-CM | POA: Insufficient documentation

## 2015-06-21 DIAGNOSIS — I10 Essential (primary) hypertension: Secondary | ICD-10-CM | POA: Diagnosis not present

## 2015-06-21 DIAGNOSIS — Z8572 Personal history of non-Hodgkin lymphomas: Secondary | ICD-10-CM | POA: Diagnosis not present

## 2015-06-21 DIAGNOSIS — Z9012 Acquired absence of left breast and nipple: Secondary | ICD-10-CM | POA: Insufficient documentation

## 2015-06-21 LAB — COMPREHENSIVE METABOLIC PANEL
ALBUMIN: 4.3 g/dL (ref 3.5–5.0)
ALK PHOS: 72 U/L (ref 38–126)
ALT: 20 U/L (ref 14–54)
AST: 23 U/L (ref 15–41)
Anion gap: 11 (ref 5–15)
BUN: 12 mg/dL (ref 6–20)
CALCIUM: 9.3 mg/dL (ref 8.9–10.3)
CHLORIDE: 99 mmol/L — AB (ref 101–111)
CO2: 30 mmol/L (ref 22–32)
CREATININE: 0.67 mg/dL (ref 0.44–1.00)
GFR calc non Af Amer: >60 mL/min (ref >60)
GLUCOSE: 95 mg/dL (ref 65–99)
Potassium: 3.9 mmol/L (ref 3.5–5.1)
Sodium: 140 mmol/L (ref 135–145)
Total Bilirubin: 0.5 mg/dL (ref 0.3–1.2)
Total Protein: 7.9 g/dL (ref 6.5–8.1)

## 2015-06-21 LAB — CBC WITH DIFFERENTIAL/PLATELET
BASOS PCT: 1 %
Basophils Absolute: 0 10*3/uL (ref 0–0.1)
EOS ABS: 0 10*3/uL (ref 0–0.7)
Eosinophils Relative: 1 %
HEMATOCRIT: 38.8 % (ref 35.0–47.0)
Hemoglobin: 13.4 g/dL (ref 12.0–16.0)
LYMPHS ABS: 1.6 10*3/uL (ref 1.0–3.6)
Lymphocytes Relative: 32 %
MCH: 31.4 pg (ref 26.0–34.0)
MCHC: 34.4 g/dL (ref 32.0–36.0)
MCV: 91.2 fL (ref 80.0–100.0)
MONO ABS: 0.7 10*3/uL (ref 0.2–0.9)
MONOS PCT: 14 %
NEUTROS PCT: 52 %
Neutro Abs: 2.8 10*3/uL (ref 1.4–6.5)
Platelets: 243 10*3/uL (ref 150–440)
RBC: 4.25 MIL/uL (ref 3.80–5.20)
RDW: 13.5 % (ref 11.5–14.5)
WBC: 5.2 10*3/uL (ref 3.6–11.0)

## 2015-06-21 LAB — POCT I-STAT CREATININE: Creatinine, Ser: 0.7 mg/dL (ref 0.44–1.00)

## 2015-06-21 MED ORDER — IOPAMIDOL (ISOVUE-300) INJECTION 61%
100.0000 mL | Freq: Once | INTRAVENOUS | Status: AC | PRN
  Administered 2015-06-21: 100 mL via INTRAVENOUS

## 2015-06-22 LAB — MISC LABCORP TEST (SEND OUT)
LABCORP TEST CODE: 143404
LabCorp test name: 3

## 2015-06-22 LAB — CEA: CEA: 2 ng/mL (ref 0.0–4.7)

## 2015-06-22 LAB — CANCER ANTIGEN 27.29: CA 27.29: 35.7 U/mL (ref 0.0–38.6)

## 2015-06-23 ENCOUNTER — Inpatient Hospital Stay: Payer: Medicare HMO | Attending: Internal Medicine | Admitting: Internal Medicine

## 2015-06-23 VITALS — BP 161/73 | HR 74 | Temp 96.9°F | Resp 16 | Wt 137.8 lb

## 2015-06-23 DIAGNOSIS — Z79811 Long term (current) use of aromatase inhibitors: Secondary | ICD-10-CM

## 2015-06-23 DIAGNOSIS — M8580 Other specified disorders of bone density and structure, unspecified site: Secondary | ICD-10-CM | POA: Diagnosis not present

## 2015-06-23 DIAGNOSIS — Z171 Estrogen receptor negative status [ER-]: Secondary | ICD-10-CM

## 2015-06-23 DIAGNOSIS — Z8572 Personal history of non-Hodgkin lymphomas: Secondary | ICD-10-CM

## 2015-06-23 DIAGNOSIS — R978 Other abnormal tumor markers: Secondary | ICD-10-CM | POA: Diagnosis not present

## 2015-06-23 DIAGNOSIS — C50912 Malignant neoplasm of unspecified site of left female breast: Secondary | ICD-10-CM

## 2015-06-23 NOTE — Progress Notes (Signed)
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  She denies any unusual body aches and muscle aches joint pains. No nausea no vomiting. No headaches. No new lumps or bumps. She is currently wearing a sleeve for the left upper extremity lymphedema. This is improving. Intermittent hot flashes not any worse.    REVIEW OF SYSTEMS:  A complete 10 point review of system is done which is negative except mentioned above/history of present illness.   PAST MEDICAL HISTORY :  Past Medical History  Diagnosis Date  . Hypertension   . Hyperlipidemia   . Murmur   . Hyperlipemia   . Osteoporosis   . Breast cancer metastasized to axillary lymph node ([place]) August 2015    T2, N1, ER negative, PR negative, HER-2 amplified. 2 cm axillary node. One of 11 nodes positive on post-adjuvant chemotherapy axillary dissection. 3+ centimeter tumor.  . Breast cancer ([place]) 2015    left breast cancer  . 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 Lymphedema of upper extremity following lymphadenectomy August 2015     Left upper extremity.    PAST SURGICAL HISTORY :   Past Surgical History  Procedure Laterality Date  . Abdominal hysterectomy    . Appendectomy    . Mastectomy Left 02-25-14    Dr Bary Castilla  . Breast surgery Left 02/25/14    Modified radical mastectomy, T2 N1. Grade 3, ER, PR negative, HER-2/neu 3+.    FAMILY HISTORY :   Family History  Problem Relation Age of Onset  . Hypertension Mother   . Hypertension Maternal Aunt   . Hypertension Maternal Uncle   . Hypertension Maternal Aunt   . Hypertension Maternal Aunt     SOCIAL HISTORY:   Social History  Substance Use Topics  . Smoking status: Never Smoker   . Smokeless tobacco: Never Used  . Alcohol Use: No    ALLERGIES:  has No Known Allergies.  MEDICATIONS:  Current Outpatient Prescriptions  Medication Sig Dispense Refill  . alendronate (FOSAMAX) 70 MG tablet Take 70 mg by mouth once a week. Take with a full glass of water on an empty stomach.    Marland Kitchen amLODipine (NORVASC) 2.5 MG tablet Take 2.5 mg by mouth daily.    Marland Kitchen anastrozole (ARIMIDEX) 1 MG tablet Take 1 tablet (1 mg total) by mouth daily. 90 tablet 4  . aspirin 81 MG tablet Take 81 mg by mouth daily.    . cholecalciferol (VITAMIN D) 1000 UNITS tablet Take 1,000 Units by mouth daily.    Marland Kitchen lidocaine (XYLOCAINE) 2 % jelly AS DIRECTED WITH  CHEMO  2  . losartan-hydrochlorothiazide (HYZAAR) 100-25 MG per tablet Take 1 tablet by mouth daily.    . Multiple Vitamins-Minerals (MULTIVITAMIN WITH MINERALS) tablet Take 1 tablet by mouth daily.    . simvastatin (ZOCOR) 40 MG tablet Take 40 mg by mouth at bedtime.     No current facility-administered medications for this visit.   Facility-Administered Medications Ordered in Other Visits  Medication Dose Route Frequency Provider Last Rate Last Dose  . sodium chloride 0.9 % injection 10 mL  10 mL Intracatheter PRN Leia Alf, MD   10 mL at 06/17/14 0930  . sodium chloride 0.9 % injection 10 mL  10 mL Intracatheter PRN Leia Alf, MD   10 mL at 08/12/14 1357    PHYSICAL EXAMINATION: ECOG PERFORMANCE STATUS: 0 - Asymptomatic  BP 161/73 mmHg  Pulse 74  Temp(Src) 96.9 F (36.1 C) (Tympanic)  Resp 16  Wt 137 lb 12.6 oz (62.5 kg)  Filed Weights   06/23/15 1028  Weight: 137 lb 12.6 oz (62.5 kg)    GENERAL: Well-nourished well-developed; Alert, no distress and comfortable.  With daughter EYES: no pallor or icterus.  OROPHARYNX: no thrush or ulceration; poor dentition.no masses felt LYMPH:  no palpable lymphadenopathy in the cervical, axillary or inguinal regions LUNGS: clear to auscultation and  No wheeze or crackles HEART/CVS: regular rate & rhythm and no murmurs; No lower extremity edema; lymphedema noted in the left upper extremity/she is wearing a sleeve.  ABDOMEN:abdomen soft, non-tender and normal bowel sounds Musculoskeletal:no cyanosis of digits and no clubbing  PSYCH: alert & oriented x 3 with fluent speech NEURO: no focal motor/sensory deficits SKIN:  no rashes or significant lesions  LABORATORY DATA:  I have reviewed the data as listed    Component Value Date/Time   NA 140 06/21/2015 1012   NA 136 05/27/2014 0942   NA 142 10/08/2013 1631   K 3.9 06/21/2015 1012   K 3.4* 05/27/2014 0942   CL 99* 06/21/2015 1012   CL 102 05/27/2014 0942   CO2 30 06/21/2015 1012   CO2 29 05/27/2014 0942   GLUCOSE 95 06/21/2015 1012   GLUCOSE 106* 05/27/2014 0942   GLUCOSE 110* 10/08/2013 1631   BUN 12 06/21/2015 1012   BUN 13 05/27/2014 0942   BUN 10 10/08/2013 1631   CREATININE 0.70 06/21/2015 1051   CREATININE 0.58 05/27/2014 0942   CALCIUM 9.3 06/21/2015 1012   CALCIUM 8.6* 05/27/2014 0942   PROT 7.9 06/21/2015 1012   PROT 6.8 05/27/2014 0942   PROT 7.9 10/08/2013 1631   ALBUMIN 4.3 06/21/2015 1012   ALBUMIN 3.8 05/27/2014 0942   ALBUMIN 4.5 10/08/2013 1631   AST 23 06/21/2015 1012   AST 18 05/27/2014 0942   ALT 20 06/21/2015 1012   ALT 14 05/27/2014 0942   ALKPHOS 72 06/21/2015 1012    ALKPHOS 69 05/27/2014 0942   BILITOT 0.5 06/21/2015 1012   BILITOT 0.6 05/27/2014 0942   GFRNONAA >60 06/21/2015 1012   GFRNONAA >60 05/27/2014 0942   GFRNONAA >60 01/26/2014 0943   GFRAA >60 06/21/2015 1012   GFRAA >60 05/27/2014 0942   GFRAA >60 01/26/2014 0943    No results found for: SPEP, UPEP  Lab Results  Component Value Date   WBC 5.2 06/21/2015   NEUTROABS 2.8 06/21/2015   HGB 13.4 06/21/2015   HCT 38.8 06/21/2015   MCV 91.2 06/21/2015   PLT 243 06/21/2015      Chemistry      Component Value  Date/Time   NA 140 06/21/2015 1012   NA 136 05/27/2014 0942   NA 142 10/08/2013 1631   K 3.9 06/21/2015 1012   K 3.4* 05/27/2014 0942   CL 99* 06/21/2015 1012   CL 102 05/27/2014 0942   CO2 30 06/21/2015 1012   CO2 29 05/27/2014 0942   BUN 12 06/21/2015 1012   BUN 13 05/27/2014 0942   BUN 10 10/08/2013 1631   CREATININE 0.70 06/21/2015 1051   CREATININE 0.58 05/27/2014 0942      Component Value Date/Time   CALCIUM 9.3 06/21/2015 1012   CALCIUM 8.6* 05/27/2014 0942   ALKPHOS 72 06/21/2015 1012   ALKPHOS 69 05/27/2014 0942   AST 23 06/21/2015 1012   AST 18 05/27/2014 0942   ALT 20 06/21/2015 1012   ALT 14 05/27/2014 0942   BILITOT 0.5 06/21/2015 1012   BILITOT 0.6 05/27/2014 0942        ASSESSMENT & PLAN:   # 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Clinically no evidence of recurrence. On Arimidex side effects noted. CT scan chest abdomen pelvis no evidence of recurrence. Delayed images with the patient and family given the report.  # Elevated tumor marker CA 2729-  May 2017- Normal. Again repeat in 3 months.  # History of MALT lymphoma of the lung- clinically no evidence of progression noted. CT again no evidence of recurrence.  # Osteoporosis surveillance-she is on fosomax/ vit D;  bone density May 2017 osteopenia. Reviewed.  # Follow-up with me in approximately  4 months/  CA 2729.  # 25 minutes face-to-face with the patient discussing the above plan of care; more than 50% of time spent on prognosis/ natural history; counseling and coordination.     Cammie Sickle, MD 06/23/2015 11:05 AM

## 2015-06-23 NOTE — Progress Notes (Signed)
Patient does not offer any problems today and is tolerating Anastrozole.

## 2015-06-23 NOTE — Progress Notes (Signed)
Pt's daughter picked up FMLA papers today. Pt requested additional days off for potential FMLA days for her mother for herself for a 'down day'. I explained to daughter that Dr. Rogue Bussing could only write pt's daughter out of work on the days patient has an actual apt in the cancer center. She states that she also uses her FMLA days for her mom to take her father, whom also has cancer, to [facility] appointments. The patient is doing well and does not require as frequent amount of visits in cancer center.  If patient's condition changes, we will be glad to make an addendum to the [facility] papers. However, at this time, Dr. Rogue Bussing can only justify FMLA visit dates for the patient only (not her father).  I inquired whether she had FMLA papers on file for her father's health care needs. She states that she does not at this time. I advised her to contact the treating provider for her father to have that office complete FMLA paperwork on him. This way her job is covered for both her mother/father's health needs.  Teach back process performed with daughter and patient re: FMLA papers.

## 2015-09-23 ENCOUNTER — Encounter (INDEPENDENT_AMBULATORY_CARE_PROVIDER_SITE_OTHER): Payer: Self-pay

## 2015-09-23 ENCOUNTER — Inpatient Hospital Stay: Payer: Medicare HMO | Attending: Internal Medicine

## 2015-09-23 ENCOUNTER — Inpatient Hospital Stay (HOSPITAL_BASED_OUTPATIENT_CLINIC_OR_DEPARTMENT_OTHER): Payer: Medicare HMO | Admitting: Internal Medicine

## 2015-09-23 VITALS — BP 188/75 | HR 74 | Temp 98.2°F | Resp 18 | Wt 137.2 lb

## 2015-09-23 DIAGNOSIS — M818 Other osteoporosis without current pathological fracture: Secondary | ICD-10-CM | POA: Diagnosis not present

## 2015-09-23 DIAGNOSIS — Z79899 Other long term (current) drug therapy: Secondary | ICD-10-CM | POA: Insufficient documentation

## 2015-09-23 DIAGNOSIS — C50812 Malignant neoplasm of overlapping sites of left female breast: Secondary | ICD-10-CM

## 2015-09-23 DIAGNOSIS — I10 Essential (primary) hypertension: Secondary | ICD-10-CM | POA: Diagnosis not present

## 2015-09-23 DIAGNOSIS — Z171 Estrogen receptor negative status [ER-]: Secondary | ICD-10-CM | POA: Insufficient documentation

## 2015-09-23 DIAGNOSIS — I890 Lymphedema, not elsewhere classified: Secondary | ICD-10-CM | POA: Diagnosis not present

## 2015-09-23 DIAGNOSIS — E785 Hyperlipidemia, unspecified: Secondary | ICD-10-CM | POA: Insufficient documentation

## 2015-09-23 DIAGNOSIS — C884 Extranodal marginal zone b-cell lymphoma of mucosa-associated lymphoid tissue (malt-lymphoma) not having achieved remission: Secondary | ICD-10-CM

## 2015-09-23 DIAGNOSIS — Z9221 Personal history of antineoplastic chemotherapy: Secondary | ICD-10-CM | POA: Insufficient documentation

## 2015-09-23 DIAGNOSIS — Z7982 Long term (current) use of aspirin: Secondary | ICD-10-CM | POA: Diagnosis not present

## 2015-09-23 DIAGNOSIS — Z8572 Personal history of non-Hodgkin lymphomas: Secondary | ICD-10-CM | POA: Insufficient documentation

## 2015-09-23 DIAGNOSIS — Z79811 Long term (current) use of aromatase inhibitors: Secondary | ICD-10-CM

## 2015-09-23 DIAGNOSIS — C50912 Malignant neoplasm of unspecified site of left female breast: Secondary | ICD-10-CM

## 2015-09-23 NOTE — Assessment & Plan Note (Addendum)
#  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Clinically no evidence of recurrence. On Arimidex- no AEs noted. CT -Jan 2017- NED. Will need mammogram in Aug 2017.   # Elevated tumor marker CA 2729-  May 2017- Normal. Again repeat in 4 months.   # History of MALT lymphoma of the lung- clinically no evidence of progression noted. CT again no evidence of recurrence.  # Osteoporosis surveillance-she is on fosomax/ vit D;  bone density May 2017 osteopenia.   # follow up in 4 months/ labs- ca 27-29; will call daughter with with results from today.

## 2015-09-23 NOTE — Progress Notes (Signed)
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 STABLE band like opacity in RUL/RML MAY 2017 CT-NED  # SEP21st 2016- MUGA 61 %; DEC 22nd 2016- 65%  # LEFT UE LYMPHEDEMA s/p PT     Breast cancer, stage 2 ([place])   10/08/2013 Initial Diagnosis    Breast cancer, stage 2 ([place])      Cancer of overlapping sites of left female breast ([place])   09/23/2015 Initial Diagnosis    Cancer of overlapping sites of left female breast [facility])       INTERVAL HISTORY:  A pleasant 75 year old female patient with above history of stage IIB breast cancer HER-2/neu positive - Currently on Femara is here for follow-up   She denies any unusual body aches and muscle aches joint pains. No nausea no vomiting. No headaches. No new lumps or bumps. She is currently wearing a sleeve for the left upper extremity lymphedema. This is improving. Intermittent hot flashes not any worse.    REVIEW OF SYSTEMS:  A complete 10 point review of system is done which is negative except mentioned above/history of present illness.   PAST MEDICAL HISTORY :  Past Medical History:  Diagnosis Date  . Breast cancer ([place]) 2015   left breast cancer  . Breast cancer metastasized to axillary lymph node ([place]) August 2015   T2, N1, ER negative, PR negative, HER-2 amplified. 2 cm axillary node. One of 11 nodes positive on post-adjuvant chemotherapy axillary  dissection. 3+ centimeter tumor.  . Hyperlipemia   . Hyperlipidemia   . Hypertension   . 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  Marland Kitchen MALT lymphoma ([place])   . Murmur   . Osteoporosis     PAST SURGICAL HISTORY :   Past Surgical History:  Procedure Laterality Date  . ABDOMINAL HYSTERECTOMY    . APPENDECTOMY    . BREAST SURGERY Left 02/25/14   Modified radical mastectomy, T2 N1. Grade 3, ER, PR negative, HER-2/neu 3+.  Marland Kitchen MASTECTOMY Left 02-25-14   Dr Bary Castilla    FAMILY HISTORY :   Family History  Problem Relation Age of Onset  . Hypertension Mother   . Hypertension Maternal Aunt   . Hypertension Maternal Uncle   . Hypertension Maternal Aunt   . Hypertension Maternal Aunt     SOCIAL HISTORY:   Social History  Substance Use Topics  . Smoking status: Never Smoker  . Smokeless tobacco: Never Used  . Alcohol use No    ALLERGIES:  has No Known Allergies.  MEDICATIONS:  Current Outpatient Prescriptions  Medication Sig Dispense Refill  . alendronate (FOSAMAX) 70 MG tablet Take 70 mg by mouth once a week. Take with a full glass of water on an empty stomach.    Marland Kitchen amLODipine (NORVASC) 2.5 MG tablet Take 2.5 mg by mouth daily.    Marland Kitchen anastrozole (ARIMIDEX) 1 MG tablet Take 1 tablet (1  mg total) by mouth daily. 90 tablet 4  . aspirin 81 MG tablet Take 81 mg by mouth daily.    . cholecalciferol (VITAMIN D) 1000 UNITS tablet Take 1,000 Units by mouth daily.    Marland Kitchen lidocaine (XYLOCAINE) 2 % jelly AS DIRECTED WITH CHEMO  2  . losartan-hydrochlorothiazide (HYZAAR) 100-25 MG per tablet Take 1 tablet by mouth daily.    . Multiple Vitamins-Minerals (MULTIVITAMIN WITH MINERALS) tablet Take 1 tablet by mouth daily.    . simvastatin (ZOCOR) 40 MG tablet Take 40 mg by mouth at bedtime.     No current facility-administered medications for this visit.    Facility-Administered Medications Ordered in Other Visits  Medication Dose Route Frequency  Provider Last Rate Last Dose  . sodium chloride 0.9 % injection 10 mL  10 mL Intracatheter PRN Leia Alf, MD   10 mL at 06/17/14 0930  . sodium chloride 0.9 % injection 10 mL  10 mL Intracatheter PRN Leia Alf, MD   10 mL at 08/12/14 1357    PHYSICAL EXAMINATION: ECOG PERFORMANCE STATUS: 0 - Asymptomatic  BP (!) 188/75 (BP Location: Right Arm, Patient Position: Sitting)   Pulse 74   Temp 98.2 F (36.8 C) (Tympanic)   Resp 18   Wt 137 lb 4 oz (62.3 kg)   BMI 26.80 kg/m   Filed Weights   09/23/15 1027  Weight: 137 lb 4 oz (62.3 kg)    GENERAL: Well-nourished well-developed; Alert, no distress and comfortable.  With daughter/Husband. EYES: no pallor or icterus.  OROPHARYNX: no thrush or ulceration; poor dentition.no masses felt LYMPH:  no palpable lymphadenopathy in the cervical, axillary or inguinal regions LUNGS: clear to auscultation and  No wheeze or crackles HEART/CVS: regular rate & rhythm and no murmurs; No lower extremity edema; lymphedema noted in the left upper extremity/she is wearing a sleeve.  ABDOMEN:abdomen soft, non-tender and normal bowel sounds Musculoskeletal:no cyanosis of digits and no clubbing  PSYCH: alert & oriented x 3 with fluent speech NEURO: no focal motor/sensory deficits SKIN:  no rashes or significant lesions  LABORATORY DATA:  I have reviewed the data as listed    Component Value Date/Time   NA 140 06/21/2015 1012   NA 136 05/27/2014 0942   K 3.9 06/21/2015 1012   K 3.4 (L) 05/27/2014 0942   CL 99 (L) 06/21/2015 1012   CL 102 05/27/2014 0942   CO2 30 06/21/2015 1012   CO2 29 05/27/2014 0942   GLUCOSE 95 06/21/2015 1012   GLUCOSE 106 (H) 05/27/2014 0942   BUN 12 06/21/2015 1012   BUN 13 05/27/2014 0942   CREATININE 0.70 06/21/2015 1051   CREATININE 0.58 05/27/2014 0942   CALCIUM 9.3 06/21/2015 1012   CALCIUM 8.6 (L) 05/27/2014 0942   PROT 7.9 06/21/2015 1012   PROT 6.8 05/27/2014 0942   ALBUMIN 4.3 06/21/2015 1012    ALBUMIN 3.8 05/27/2014 0942   AST 23 06/21/2015 1012   AST 18 05/27/2014 0942   ALT 20 06/21/2015 1012   ALT 14 05/27/2014 0942   ALKPHOS 72 06/21/2015 1012   ALKPHOS 69 05/27/2014 0942   BILITOT 0.5 06/21/2015 1012   BILITOT 0.6 05/27/2014 0942   GFRNONAA >60 06/21/2015 1012   GFRNONAA >60 05/27/2014 0942   GFRAA >60 06/21/2015 1012   GFRAA >60 05/27/2014 0942    No results found for: SPEP, UPEP  Lab Results  Component Value Date   WBC 5.2 06/21/2015   NEUTROABS 2.8 06/21/2015   HGB 13.4 06/21/2015  HCT 38.8 06/21/2015   MCV 91.2 06/21/2015   PLT 243 06/21/2015      Chemistry      Component Value Date/Time   NA 140 06/21/2015 1012   NA 136 05/27/2014 0942   K 3.9 06/21/2015 1012   K 3.4 (L) 05/27/2014 0942   CL 99 (L) 06/21/2015 1012   CL 102 05/27/2014 0942   CO2 30 06/21/2015 1012   CO2 29 05/27/2014 0942   BUN 12 06/21/2015 1012   BUN 13 05/27/2014 0942   CREATININE 0.70 06/21/2015 1051   CREATININE 0.58 05/27/2014 0942      Component Value Date/Time   CALCIUM 9.3 06/21/2015 1012   CALCIUM 8.6 (L) 05/27/2014 0942   ALKPHOS 72 06/21/2015 1012   ALKPHOS 69 05/27/2014 0942   AST 23 06/21/2015 1012   AST 18 05/27/2014 0942   ALT 20 06/21/2015 1012   ALT 14 05/27/2014 0942   BILITOT 0.5 06/21/2015 1012   BILITOT 0.6 05/27/2014 0942        ASSESSMENT & PLAN:   Cancer of overlapping sites of left female breast ([place]) # 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Clinically no evidence of recurrence. On Arimidex- no AEs noted. CT -Jan 2017- NED. Will need mammogram in Aug 2017.   # Elevated tumor marker CA 2729-  May 2017- Normal. Again repeat in 4 months.   # History of MALT lymphoma of the lung- clinically no evidence of progression noted. CT again no evidence of recurrence.  # Osteoporosis surveillance-she is on fosomax/ vit D;  bone density May 2017 osteopenia.   # follow up  in 4 months/ labs- ca 27-29; will call daughter with with results from today.      Cammie Sickle, MD 09/24/2015 8:19 AM

## 2015-09-24 LAB — CANCER ANTIGEN 27.29: CA 27.29: 35.3 U/mL (ref 0.0–38.6)

## 2015-09-27 NOTE — Progress Notes (Signed)
Called patient to inform her that her CEA was normal.  Verbalized understanding.

## 2015-09-30 ENCOUNTER — Encounter: Payer: Self-pay | Admitting: *Deleted

## 2015-10-06 ENCOUNTER — Encounter: Payer: Self-pay | Admitting: General Surgery

## 2015-10-06 ENCOUNTER — Ambulatory Visit (INDEPENDENT_AMBULATORY_CARE_PROVIDER_SITE_OTHER): Payer: Medicare HMO | Admitting: General Surgery

## 2015-10-06 VITALS — BP 162/84 | HR 66 | Resp 12 | Ht 59.0 in | Wt 138.0 lb

## 2015-10-06 DIAGNOSIS — Z1211 Encounter for screening for malignant neoplasm of colon: Secondary | ICD-10-CM | POA: Diagnosis not present

## 2015-10-06 MED ORDER — POLYETHYLENE GLYCOL 3350 17 GM/SCOOP PO POWD: 1.0000 | Freq: Once | ORAL | 0 refills | Status: AC

## 2015-10-06 NOTE — Patient Instructions (Addendum)
Colonoscopy A colonoscopy is an exam to look at the entire large intestine (colon). This exam can help find problems such as tumors, polyps, inflammation, and areas of bleeding. The exam takes about 1 hour.  LET [facility] CARE PROVIDER KNOW ABOUT:   Any allergies you have.  All medicines you are taking, including vitamins, herbs, eye drops, creams, and over-the-counter medicines.  Previous problems you or members of your family have had with the use of anesthetics.  Any blood disorders you have.  Previous surgeries you have had.  Medical conditions you have. RISKS AND COMPLICATIONS  Generally, this is a safe procedure. However, as with any procedure, complications can occur. Possible complications include:  Bleeding.  Tearing or rupture of the colon wall.  Reaction to medicines given during the exam.  Infection (rare). BEFORE THE PROCEDURE   Ask your health care provider about changing or stopping your regular medicines.  You may be prescribed an oral bowel prep. This involves drinking a large amount of medicated liquid, starting the day before your procedure. The liquid will cause you to have multiple loose stools until your stool is almost clear or light green. This cleans out your colon in preparation for the procedure.  Do not eat or drink anything else once you have started the bowel prep, unless your health care provider tells you it is safe to do so.  Arrange for someone to drive you home after the procedure. PROCEDURE   You will be given medicine to help you relax (sedative).  You will lie on your side with your knees bent.  A long, flexible tube with a light and camera on the end (colonoscope) will be inserted through the rectum and into the colon. The camera sends video back to a computer screen as it moves through the colon. The colonoscope also releases carbon dioxide gas to inflate the colon. This helps your health care provider see the area better.  During  the exam, your health care provider may take a small tissue sample (biopsy) to be examined under a microscope if any abnormalities are found.  The exam is finished when the entire colon has been viewed. AFTER THE PROCEDURE   Do not drive for 24 hours after the exam.  You may have a small amount of blood in your stool.  You may pass moderate amounts of gas and have mild abdominal cramping or bloating. This is caused by the gas used to inflate your colon during the exam.  Ask when your test results will be ready and how you will get your results. Make sure you get your test results.   This information is not intended to replace advice given to you by your health care provider. Make sure you discuss any questions you have with your health care provider.   Document Released: 01/28/2000 Document Revised: 11/20/2012 Document Reviewed: 10/07/2012 Elsevier Interactive Patient Education [employer].  The patient is scheduled for a Colonoscopy at [facility] on 11/10/15. They are aware to call the day before to get their arrival time. She will only take her Amlodipine at 6 am with a small sip of water. Miralax prescription has been sent into the patient's pharmacy. The patient is aware of date and instructions.

## 2015-10-06 NOTE — Progress Notes (Signed)
Patient ID: Andrea Rodgers, female   DOB: 04-Jun-1940, 75 y.o.   MRN: [id number]  Chief Complaint  Patient presents with  . Colonoscopy    HPI Andrea Rodgers is a 75 y.o. female here today for a evaluation of a colonoscopy. Patient states no GI problems at this time. Moves her bowels daily.  HPI  Past Medical History:  Diagnosis Date  . Breast cancer ([place]) 2015   left breast cancer  . 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 2 cm axillary node. One of 11 nodes positive on post-adjuvant chemotherapy axillary dissection. 3+ centimeter tumor.  . Hyperlipemia   . Hyperlipidemia   . Hypertension   . 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 Murmur   . Osteoporosis     Past Surgical History:  Procedure Laterality Date  . ABDOMINAL HYSTERECTOMY    . APPENDECTOMY    . BREAST SURGERY Left 02/25/14   Modified radical mastectomy, T2 N1. Grade 3, ER, PR negative, HER-2/neu 3+.  Marland Kitchen MASTECTOMY Left 02-25-14   Dr Bary Castilla    Family History  Problem Relation Age of Onset  . Hypertension Mother   . Hypertension Maternal Aunt   . Hypertension Maternal Uncle   . Hypertension Maternal Aunt   . Hypertension Maternal Aunt     Social History Social History  Substance Use Topics  . Smoking status: Never Smoker  . Smokeless tobacco: Never Used  . Alcohol use No    No Known Allergies  Current Outpatient Prescriptions  Medication Sig Dispense Refill  . alendronate (FOSAMAX) 70 MG tablet Take 70 mg by mouth once a week. Take with a full glass of water on an empty stomach.    Marland Kitchen amLODipine (NORVASC) 2.5 MG tablet Take 2.5 mg by mouth daily.    Marland Kitchen anastrozole (ARIMIDEX) 1 MG tablet Take 1 tablet (1 mg total) by mouth daily. 90 tablet 4  . aspirin 81 MG tablet Take 81 mg by mouth daily.    . cholecalciferol (VITAMIN D) 1000 UNITS tablet  Take 1,000 Units by mouth daily.    Marland Kitchen lidocaine (XYLOCAINE) 2 % jelly AS DIRECTED WITH CHEMO  2  . losartan-hydrochlorothiazide (HYZAAR) 100-25 MG per tablet Take 1 tablet by mouth daily.    . Multiple Vitamins-Minerals (MULTIVITAMIN WITH MINERALS) tablet Take 1 tablet by mouth daily.    . simvastatin (ZOCOR) 40 MG tablet Take 40 mg by mouth at bedtime.    . polyethylene glycol powder (GLYCOLAX/MIRALAX) powder Take 255 g by mouth once. 255 g 0   No current facility-administered medications for this visit.    Facility-Administered Medications Ordered in Other Visits  Medication Dose Route Frequency Provider Last Rate Last Dose  . sodium chloride 0.9 % injection 10 mL  10 mL Intracatheter PRN Leia Alf, MD   10 mL at 06/17/14 0930  . sodium chloride 0.9 % injection 10 mL  10 mL Intracatheter PRN Leia Alf, MD   10 mL at 08/12/14 1357    Review of Systems Review of Systems  Constitutional: Negative.   Respiratory: Negative.   Cardiovascular: Negative.     Blood pressure (!) 162/84, pulse 66, resp. rate 12, height '4\' 11"'$  (1.499 m), weight 138 lb (62.6 kg).  Physical Exam Physical Exam  Constitutional: She is oriented to person, place, and time. She appears well-developed and well-nourished.  Eyes: Conjunctivae are normal. No scleral icterus.  Neck: Neck supple.  Cardiovascular: Normal rate, regular rhythm and normal heart sounds.   Pulmonary/Chest: Effort normal and breath sounds normal.  Abdominal: Soft. Bowel sounds are normal. There is no tenderness.  Musculoskeletal:       Arms: Lymphadenopathy:    She has no cervical adenopathy.  Neurological: She is alert and oriented to person, place, and time.  Skin: Skin is warm and dry.       Assessment    Candidate for screening colonoscopy.    Plan    The patient was encouraged to use her compressive sleeve at all times, and to use her pneumatic device as previously instructed until her forearm circumference is  symmetrical.    Colonoscopy with possible biopsy/polypectomy prn: Information regarding the procedure, including its potential risks and complications (including but not limited to perforation of the bowel, which may require emergency surgery to repair, and bleeding) was verbally given to the patient. Educational information regarding lower intestinal endoscopy was given to the patient. Written instructions for how to complete the bowel prep using Miralax were provided. The importance of drinking ample fluids to avoid dehydration as a result of the prep emphasized.  The patient is scheduled for a Colonoscopy at [facility] on 11/10/15. They are aware to call the day before to get their arrival time. She will only take her Amlodipine at 6 am with a small sip of water. Miralax prescription has been sent into the patient's pharmacy. The patient is aware of date and instructions.    Patient scheduled on 11/10/15.  This information has been scribed by Gaspar Cola CMA.    [name] 10/06/2015, 8:18 PM

## 2015-10-14 ENCOUNTER — Other Ambulatory Visit: Payer: Self-pay | Admitting: Internal Medicine

## 2015-10-14 ENCOUNTER — Ambulatory Visit: Payer: Medicare HMO

## 2015-10-14 ENCOUNTER — Ambulatory Visit
Admission: RE | Admit: 2015-10-14 | Discharge: 2015-10-14 | Disposition: A | Discharge status: Home / Self Care | Payer: Medicare HMO | Source: Ambulatory Visit | Attending: Internal Medicine | Admitting: Internal Medicine

## 2015-10-14 DIAGNOSIS — C884 Extranodal marginal zone B-cell lymphoma of mucosa-associated lymphoid tissue [MALT-lymphoma]: Secondary | ICD-10-CM

## 2015-10-14 DIAGNOSIS — Z1231 Encounter for screening mammogram for malignant neoplasm of breast: Secondary | ICD-10-CM | POA: Diagnosis not present

## 2015-10-14 DIAGNOSIS — C50812 Malignant neoplasm of overlapping sites of left female breast: Secondary | ICD-10-CM

## 2015-10-14 DIAGNOSIS — Z853 Personal history of malignant neoplasm of breast: Secondary | ICD-10-CM | POA: Insufficient documentation

## 2015-11-08 ENCOUNTER — Other Ambulatory Visit: Payer: Self-pay

## 2015-11-08 MED ORDER — POLYETHYLENE GLYCOL 3350 17 GM/SCOOP PO POWD: 1.0000 | Freq: Once | ORAL | 0 refills | Status: AC

## 2015-11-09 ENCOUNTER — Encounter: Payer: Self-pay | Admitting: *Deleted

## 2015-11-10 ENCOUNTER — Ambulatory Visit: Admission: RE | Admit: 2015-11-10 | Payer: Medicare HMO | Source: Ambulatory Visit | Admitting: General Surgery

## 2015-11-10 ENCOUNTER — Telehealth: Payer: Self-pay

## 2015-11-10 ENCOUNTER — Encounter: Admission: RE | Payer: Self-pay | Source: Ambulatory Visit

## 2015-11-10 SURGERY — COLONOSCOPY WITH PROPOFOL
Anesthesia: General

## 2015-11-10 MED ORDER — POLYETHYLENE GLYCOL 3350 17 GM/SCOOP PO POWD: 1.0000 | Freq: Once | ORAL | 0 refills | Status: AC

## 2015-11-10 NOTE — Telephone Encounter (Signed)
Patient called to say that she had drank her bowel prep solution too fast yesterday evening and had thrown up most of if. She states that she is still having brown stools this morning and would like to reschedule her colonoscopy.

## 2015-11-10 NOTE — Telephone Encounter (Signed)
The patient is rescheduled for a Colonoscopy at [facility] on 12/22/15. They are aware to call the day before to get their arrival time. She will only take her Amlodipine at 6 am with a small sip of water. Miralax prescription has been sent into the patient's pharmacy. The patient is aware of date and instructions.

## 2015-12-15 ENCOUNTER — Telehealth: Payer: Self-pay | Admitting: *Deleted

## 2015-12-15 NOTE — Telephone Encounter (Signed)
Patient was contacted today and medication list was reviewed. All medications are the same. Patient states she was placed on an antibiotic by Dr. Hall Busing for a boil under her arm. Antibiotics will be finished on Saturday. She reports the area is doing much better.   This patient was encouraged to pick up Miralax prescription since she has not done so yet.   We will proceed with colonoscopy as scheduled for 12-22-15 at [facility].   This patient was instructed to call the office should she have further questions.

## 2015-12-22 ENCOUNTER — Encounter: Admission: RE | Payer: Self-pay | Source: Ambulatory Visit

## 2015-12-22 ENCOUNTER — Ambulatory Visit: Admission: RE | Admit: 2015-12-22 | Payer: Medicare HMO | Source: Ambulatory Visit | Admitting: General Surgery

## 2015-12-22 ENCOUNTER — Encounter: Payer: Self-pay | Admitting: *Deleted

## 2015-12-22 SURGERY — COLONOSCOPY WITH PROPOFOL
Anesthesia: General

## 2015-12-22 NOTE — Progress Notes (Unsigned)
Patient called the office this morning to report that she got very nauseous with colonoscopy prep last night and was only able to drink half of Miralax solution.   This patient states this happened the last time she was scheduled as well and did not mention this to me when I called her last week.   She did not call the on call service last night to see about a prescription for nausea medication. However, she did call the hospital to cancel this morning prior to calling our office.   Trish in Endoscopy confirmed cancellation.

## 2015-12-23 ENCOUNTER — Telehealth: Payer: Self-pay | Admitting: *Deleted

## 2015-12-23 MED ORDER — ONDANSETRON 4 MG PO TBDP: ORAL_TABLET | ORAL | 0 refills | Status: DC

## 2015-12-23 MED ORDER — METOCLOPRAMIDE HCL 10 MG PO TABS: ORAL_TABLET | ORAL | 0 refills | Status: DC

## 2015-12-23 MED ORDER — POLYETHYLENE GLYCOL 3350 17 GM/SCOOP PO POWD: ORAL | 0 refills | Status: DC

## 2015-12-23 NOTE — Telephone Encounter (Signed)
Patient's colonoscopy has been rescheduled to 01-26-16 at [facility].   Prescription for Reglan, Zofran ODT, and Miralax sent in to patient's pharmacy today. Instructions reviewed with patient.  This patient was instructed to call the office should she have further questions.

## 2015-12-23 NOTE — Telephone Encounter (Signed)
-----   Message from [name], MD sent at 12/23/2015  6:58 AM EST ----- Send RX for Reglan, 10 mg to take 30 minutes before starting prep, repeat in 4 hours if needed. ( #2, 10 mg tablets). Instruct her to drink every 30 minutes, rather than 15 to minimize nausea.   RX for Zofran ODT, 4 mg, to use under her tongue every four hours if nauseated during the prep, ( #6).

## 2016-01-12 ENCOUNTER — Telehealth: Payer: Self-pay | Admitting: *Deleted

## 2016-01-12 NOTE — Telephone Encounter (Signed)
Contacted daughter FMLA completed and ready to be picked up. Copy retained for patient's chart.

## 2016-01-20 ENCOUNTER — Telehealth: Payer: Self-pay | Admitting: *Deleted

## 2016-01-20 NOTE — Telephone Encounter (Signed)
Patient was contacted today and she states she has had no change in medications since her last office visit.   Instructions were reviewed with patient by phone today regarding nausea medications. She states she has picked these up from the pharmacy but has yet to get Miralax prescription. Patient was encouraged to do so.   We will proceed with colonoscopy as scheduled for 01-26-16 at [facility].   This patient was instructed to call the office should she have further questions.

## 2016-01-24 ENCOUNTER — Inpatient Hospital Stay: Payer: Medicare HMO

## 2016-01-24 ENCOUNTER — Inpatient Hospital Stay: Payer: Medicare HMO | Admitting: Internal Medicine

## 2016-01-26 ENCOUNTER — Encounter: Payer: Self-pay | Admitting: *Deleted

## 2016-01-26 ENCOUNTER — Ambulatory Visit: Payer: Medicare HMO | Admitting: Anesthesiology

## 2016-01-26 ENCOUNTER — Encounter
Admission: RE | Disposition: A | Discharge status: Home / Self Care | Payer: Self-pay | Source: Ambulatory Visit | Attending: General Surgery

## 2016-01-26 ENCOUNTER — Ambulatory Visit
Admission: RE | Admit: 2016-01-26 | Discharge: 2016-01-26 | Disposition: A | Discharge status: Home / Self Care | Payer: Medicare HMO | Source: Ambulatory Visit | Attending: General Surgery | Admitting: General Surgery

## 2016-01-26 DIAGNOSIS — Z7982 Long term (current) use of aspirin: Secondary | ICD-10-CM | POA: Diagnosis not present

## 2016-01-26 DIAGNOSIS — D120 Benign neoplasm of cecum: Secondary | ICD-10-CM | POA: Insufficient documentation

## 2016-01-26 DIAGNOSIS — Z79899 Other long term (current) drug therapy: Secondary | ICD-10-CM | POA: Insufficient documentation

## 2016-01-26 DIAGNOSIS — E669 Obesity, unspecified: Secondary | ICD-10-CM | POA: Insufficient documentation

## 2016-01-26 DIAGNOSIS — Z853 Personal history of malignant neoplasm of breast: Secondary | ICD-10-CM | POA: Insufficient documentation

## 2016-01-26 DIAGNOSIS — K219 Gastro-esophageal reflux disease without esophagitis: Secondary | ICD-10-CM | POA: Diagnosis not present

## 2016-01-26 DIAGNOSIS — E785 Hyperlipidemia, unspecified: Secondary | ICD-10-CM | POA: Insufficient documentation

## 2016-01-26 DIAGNOSIS — K5730 Diverticulosis of large intestine without perforation or abscess without bleeding: Secondary | ICD-10-CM | POA: Insufficient documentation

## 2016-01-26 DIAGNOSIS — I10 Essential (primary) hypertension: Secondary | ICD-10-CM | POA: Diagnosis not present

## 2016-01-26 DIAGNOSIS — Z6822 Body mass index (BMI) 22.0-22.9, adult: Secondary | ICD-10-CM | POA: Diagnosis not present

## 2016-01-26 DIAGNOSIS — Z1211 Encounter for screening for malignant neoplasm of colon: Secondary | ICD-10-CM | POA: Insufficient documentation

## 2016-01-26 HISTORY — PX: COLONOSCOPY WITH PROPOFOL: SHX5780

## 2016-01-26 SURGERY — COLONOSCOPY WITH PROPOFOL
Anesthesia: General

## 2016-01-26 MED ORDER — PROPOFOL 500 MG/50ML IV EMUL
INTRAVENOUS | Status: DC | PRN
  Administered 2016-01-26: 120 ug/kg/min via INTRAVENOUS

## 2016-01-26 MED ORDER — EPHEDRINE SULFATE 50 MG/ML IJ SOLN
INTRAMUSCULAR | Status: DC | PRN
  Administered 2016-01-26: 10 mg via INTRAVENOUS

## 2016-01-26 MED ORDER — PHENYLEPHRINE HCL 10 MG/ML IJ SOLN
INTRAMUSCULAR | Status: DC | PRN
  Administered 2016-01-26: 100 ug via INTRAVENOUS

## 2016-01-26 MED ORDER — FENTANYL CITRATE (PF) 100 MCG/2ML IJ SOLN
INTRAMUSCULAR | Status: DC | PRN
  Administered 2016-01-26: 50 ug via INTRAVENOUS

## 2016-01-26 MED ORDER — SODIUM CHLORIDE 0.9 % IV SOLN: INTRAVENOUS | Status: DC

## 2016-01-26 MED ORDER — PROPOFOL 10 MG/ML IV BOLUS
INTRAVENOUS | Status: DC | PRN
  Administered 2016-01-26: 30 mg via INTRAVENOUS

## 2016-01-26 NOTE — Anesthesia Preprocedure Evaluation (Signed)
Anesthesia Evaluation  Patient identified by MRN, date of birth, ID band Patient awake    Reviewed: Allergy & Precautions, NPO status , Patient's Chart, lab work & pertinent test results  History of Anesthesia Complications Negative for: history of anesthetic complications  Airway Mallampati: II       Dental  (+) Upper Dentures   Pulmonary neg pulmonary ROS,    breath sounds clear to auscultation       Cardiovascular Exercise Tolerance: Good hypertension, Pt. on medications  Rhythm:Regular     Neuro/Psych negative neurological ROS  negative psych ROS   GI/Hepatic negative GI ROS, Neg liver ROS, GERD  Medicated,  Endo/Other  negative endocrine ROS  Renal/GU negative Renal ROS     Musculoskeletal   Abdominal (+) + obese,   Peds negative pediatric ROS (+)  Hematology   Anesthesia Other Findings   Reproductive/Obstetrics                             Anesthesia Physical Anesthesia Plan  ASA: II  Anesthesia Plan: General   Post-op Pain Management:    Induction: Intravenous  Airway Management Planned: Natural Airway and Nasal Cannula  Additional Equipment:   Intra-op Plan:   Post-operative Plan:   Informed Consent: I have reviewed the patients History and Physical, chart, labs and discussed the procedure including the risks, benefits and alternatives for the proposed anesthesia with the patient or authorized representative who has indicated his/her understanding and acceptance.     Plan Discussed with: Surgeon  Anesthesia Plan Comments:         Anesthesia Quick Evaluation

## 2016-01-26 NOTE — Op Note (Signed)
[facility] Patient Name: Andrea Rodgers Procedure Date: 01/26/2016 11:08 AM MRN: [id number] Account #: [id number] Date of Birth: 10-07-1940 Admit Type: Outpatient Age: 75 Room: [facility] ENDO ROOM 1 Gender: Female Note Status: Finalized Procedure:            Colonoscopy Indications:          Screening for colorectal malignant neoplasm Providers:            [name], MD Referring MD:         Leona Carry. Hall Busing, MD (Referring MD) Medicines:            Monitored Anesthesia Care Complications:        No immediate complications. Procedure:            Pre-Anesthesia Assessment:                       - Prior to the procedure, a History and Physical was                        performed, and patient medications, allergies and                        sensitivities were reviewed. The patient's tolerance of                        previous anesthesia was reviewed.                       - The risks and benefits of the procedure and the                        sedation options and risks were discussed with the                        patient. All questions were answered and informed                        consent was obtained.                       After obtaining informed consent, the colonoscope was                        passed under direct vision. Throughout the procedure,                        the patient's blood pressure, pulse, and oxygen                        saturations were monitored continuously. The                        Colonoscope was introduced through the anus and                        advanced to the the cecum, identified by appendiceal                        orifice and ileocecal valve. The colonoscopy was  technically difficult and complex due to multiple                        diverticula in the colon, significant looping and a                        tortuous colon. Successful completion of the procedure           was aided by changing the patient to a prone position                        and using manual pressure. The patient tolerated the                        procedure well. The quality of the bowel preparation                        was good. Findings:      A 4 mm polyp was found in the cecum. The polyp was sessile. Biopsies       were taken with a cold forceps for histology.      Multiple small and large-mouthed diverticula were found in the sigmoid       colon, descending colon, ascending colon and cecum.      The retroflexed view of the distal rectum and anal verge was normal and       showed no anal or rectal abnormalities. Impression:           - One 4 mm polyp in the cecum. Biopsied.                       - Severe diverticulosis in the sigmoid colon, in the                        descending colon, in the ascending colon and in the                        cecum.                       - The distal rectum and anal verge are normal on                        retroflexion view. Recommendation:       - Telephone endoscopist for pathology results in 1 week. Procedure Code(s):    --- Professional ---                       [phone], Colonoscopy, flexible; with biopsy, single or                        multiple Diagnosis Code(s):    --- Professional ---                       Z12.11, Encounter for screening for malignant neoplasm                        of colon                       D12.0, Benign neoplasm of cecum  K57.30, Diverticulosis of large intestine without                        perforation or abscess without bleeding CPT copyright 2016 American Medical Association. All rights reserved. The codes documented in this report are preliminary and upon coder review may  be revised to meet current compliance requirements. [name], MD 01/26/2016 12:05:09 PM This report has been signed electronically. Number of Addenda: 0 Note Initiated On: 01/26/2016 11:08  AM Scope Withdrawal Time: 0 hours 13 minutes 5 seconds  Total Procedure Duration: 0 hours 41 minutes 13 seconds       [facility]

## 2016-01-26 NOTE — Anesthesia Postprocedure Evaluation (Signed)
Anesthesia Post Note  Patient: Andrea Rodgers  Procedure(s) Performed: Procedure(s) (LRB): COLONOSCOPY WITH PROPOFOL (N/A)  Patient location during evaluation: PACU Anesthesia Type: General Level of consciousness: awake Pain management: pain level controlled Vital Signs Assessment: post-procedure vital signs reviewed and stable Cardiovascular status: stable Anesthetic complications: no    Last Vitals:  Vitals:   01/26/16 1230 01/26/16 1237  BP: (!) 122/108 140/61  Pulse: 65 64  Resp: 20 15  Temp:      Last Pain:  Vitals:   01/26/16 1237  PainSc: 0-No pain                 VAN STAVEREN,Erin Obando

## 2016-01-26 NOTE — Transfer of Care (Signed)
Immediate Anesthesia Transfer of Care Note  Patient: Andrea Rodgers  Procedure(s) Performed: Procedure(s): COLONOSCOPY WITH PROPOFOL (N/A)  Patient Location: PACU  Anesthesia Type:General  Level of Consciousness: awake and alert   Airway & Oxygen Therapy: Patient Spontanous Breathing and Patient connected to nasal cannula oxygen  Post-op Assessment: Report given to RN and Post -op Vital signs reviewed and stable  Post vital signs: Reviewed  Last Vitals:  Vitals:   01/26/16 1041  BP: (!) 157/70  Pulse: 71  Resp: 20  Temp: 36.3 C    Last Pain: There were no vitals filed for this visit.       Complications: No apparent anesthesia complications

## 2016-01-26 NOTE — H&P (Signed)
Andrea Rodgers [id number] 1940-05-09     HPI: 75 y/o woman for screening colonoscopy. Prep tolerated well with the use of Reglan and Zofran to prevent nausea and vomiting.  Feeling well.   Prescriptions Prior to Admission  Medication Sig Dispense Refill Last Dose  . alendronate (FOSAMAX) 70 MG tablet Take 70 mg by mouth once a week. Take with a full glass of water on an empty stomach.   01/25/2016 at Unknown time  . amLODipine (NORVASC) 2.5 MG tablet Take 2.5 mg by mouth daily.   01/26/2016 at Unknown time  . aspirin 81 MG tablet Take 81 mg by mouth daily.   01/25/2016 at Unknown time  . cholecalciferol (VITAMIN D) 1000 UNITS tablet Take 1,000 Units by mouth daily.   01/25/2016 at Unknown time  . losartan-hydrochlorothiazide (HYZAAR) 100-25 MG per tablet Take 1 tablet by mouth daily.   01/25/2016 at Unknown time  . metoCLOPramide (REGLAN) 10 MG tablet Take 30 minutes prior to starting colonoscopy prep and repeat in 4 hours if needed. 2 tablet 0 01/25/2016 at Unknown time  . Multiple Vitamins-Minerals (MULTIVITAMIN WITH MINERALS) tablet Take 1 tablet by mouth daily.   01/25/2016 at Unknown time  . ondansetron (ZOFRAN ODT) 4 MG disintegrating tablet Use under tongue every four (4) hours if nauseated during colonoscopy prep. 6 tablet 0 01/25/2016 at Unknown time  . polyethylene glycol powder (GLYCOLAX/MIRALAX) powder 255 grams one bottle for colonoscopy prep 255 g 0 01/25/2016 at Unknown time  . simvastatin (ZOCOR) 40 MG tablet Take 40 mg by mouth at bedtime.   01/25/2016 at Unknown time  . anastrozole (ARIMIDEX) 1 MG tablet Take 1 tablet (1 mg total) by mouth daily. 90 tablet 4 Taking  . lidocaine (XYLOCAINE) 2 % jelly AS DIRECTED WITH CHEMO  2 Completed Course at Unknown time   No Known Allergies Past Medical History:  Diagnosis Date  . Breast cancer ([place]) 2015   left breast cancer  . 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 2 cm axillary node. One of 11 nodes positive on post-adjuvant chemotherapy axillary dissection. 3+ centimeter tumor.  . Hyperlipemia   . Hyperlipidemia   . Hypertension   . 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 Murmur   . Osteoporosis    Past Surgical History:  Procedure Laterality Date  . ABDOMINAL HYSTERECTOMY    . APPENDECTOMY    . BREAST SURGERY Left 02/25/14   Modified radical mastectomy, T2 N1. Grade 3, ER, PR negative, HER-2/neu 3+.  Marland Kitchen MASTECTOMY Left 02-25-14   Dr Bary Castilla   Social History   Social History  . Marital status: Married    Spouse name: N/A  . Number of children: N/A  . Years of education: N/A   Occupational History  . Not on file.   Social History Main Topics  . Smoking status: Never Smoker  . Smokeless tobacco: Never Used  . Alcohol use No  . Drug use: No  . Sexual activity: Yes    Birth control/ protection: Post-menopausal   Other Topics Concern  . Not on file   Social History Narrative  . No narrative on file   Social History   Social History Narrative  . No narrative on file     ROS: Negative.     PE: HEENT: Negative. Lungs: Clear. Cardio: RR.  Assessment/Plan:  Proceed with planned endoscopy.   [name] 01/26/2016

## 2016-01-27 ENCOUNTER — Encounter: Payer: Self-pay | Admitting: General Surgery

## 2016-01-27 LAB — SURGICAL PATHOLOGY

## 2016-02-08 ENCOUNTER — Other Ambulatory Visit: Payer: Self-pay | Admitting: Nurse Practitioner

## 2016-02-17 ENCOUNTER — Encounter: Payer: Self-pay | Admitting: General Surgery

## 2016-02-17 ENCOUNTER — Ambulatory Visit (INDEPENDENT_AMBULATORY_CARE_PROVIDER_SITE_OTHER): Payer: Medicare HMO | Admitting: General Surgery

## 2016-02-17 ENCOUNTER — Ambulatory Visit: Payer: Medicare HMO | Admitting: General Surgery

## 2016-02-17 VITALS — BP 122/78 | HR 70 | Resp 14 | Ht 60.0 in | Wt 142.0 lb

## 2016-02-17 DIAGNOSIS — C50912 Malignant neoplasm of unspecified site of left female breast: Secondary | ICD-10-CM

## 2016-02-17 NOTE — Patient Instructions (Signed)
Patient to return in one year breast cancer check up . Dr. Rogue Bussing. Ordering mammogram.

## 2016-02-17 NOTE — Progress Notes (Signed)
Patient ID: Andrea Rodgers, female   DOB: 1940/11/06, 76 y.o.   MRN: [id number]  Chief Complaint  Patient presents with  . Breast Cancer    HPI Andrea Rodgers is a 76 y.o. female here todty for her one year follow up breast cancer check up. Patient states she is doing well.HPI  Past Medical History:  Diagnosis Date  . Breast cancer ([place]) 2015   left breast cancer  . 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 2 cm axillary node. One of 11 nodes positive on post-adjuvant chemotherapy axillary dissection. 3+ centimeter tumor.  . Hyperlipemia   . Hyperlipidemia   . Hypertension   . 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 Murmur   . Osteoporosis     Past Surgical History:  Procedure Laterality Date  . ABDOMINAL HYSTERECTOMY    . APPENDECTOMY    . BREAST SURGERY Left 02/25/14   Modified radical mastectomy, T2 N1. Grade 3, ER, PR negative, HER-2/neu 3+.  . COLONOSCOPY WITH PROPOFOL N/A 01/26/2016   Procedure: COLONOSCOPY WITH PROPOFOL;  Surgeon: [name], MD;  Location: [facility] ENDOSCOPY;  Service: Endoscopy;  Laterality: N/A;  . MASTECTOMY Left 02-25-14   Dr Bary Castilla    Family History  Problem Relation Age of Onset  . Hypertension Mother   . Hypertension Maternal Aunt   . Hypertension Maternal Uncle   . Hypertension Maternal Aunt   . Hypertension Maternal Aunt   . Breast cancer Neg Hx     Social History Social History  Substance Use Topics  . Smoking status: Never Smoker  . Smokeless tobacco: Never Used  . Alcohol use No    No Known Allergies  Current Outpatient Prescriptions  Medication Sig Dispense Refill  . alendronate (FOSAMAX) 70 MG tablet Take 70 mg by mouth once a week. Take with a full glass of water on an empty stomach.    Marland Kitchen amLODipine (NORVASC) 2.5 MG tablet Take 2.5 mg by mouth daily.    Marland Kitchen  anastrozole (ARIMIDEX) 1 MG tablet Take 1 tablet (1 mg total) by mouth daily. 90 tablet 4  . aspirin 81 MG tablet Take 81 mg by mouth daily.    . cholecalciferol (VITAMIN D) 1000 UNITS tablet Take 1,000 Units by mouth daily.    Marland Kitchen losartan-hydrochlorothiazide (HYZAAR) 100-25 MG per tablet Take 1 tablet by mouth daily.    . Multiple Vitamins-Minerals (MULTIVITAMIN WITH MINERALS) tablet Take 1 tablet by mouth daily.    . simvastatin (ZOCOR) 40 MG tablet Take 40 mg by mouth at bedtime.     No current facility-administered medications for this visit.     Review of Systems Review of Systems  Constitutional: Negative.   Respiratory: Negative.   Cardiovascular: Negative.     Blood pressure 122/78, pulse 70, resp. rate 14, height 5' (1.524 m), weight 142 lb (64.4 kg).  Physical Exam Physical Exam  Constitutional: She is oriented to person, place, and time. She appears well-developed and well-nourished.  Eyes: Conjunctivae are normal. No scleral icterus.  Neck: Neck supple.  Cardiovascular: Normal rate, regular rhythm and normal heart sounds.   Pulmonary/Chest: Effort normal and breath sounds normal. Right breast exhibits no inverted nipple, no mass, no nipple discharge, no skin change and no tenderness.    Left mastectomy site is clean and well healed.   Musculoskeletal:  Arms: Lymphadenopathy:    She has no cervical adenopathy.  Neurological: She is alert and oriented to person, place, and time.  Skin: Skin is warm and dry.    Data Reviewed 01/26/2016 colonoscopy showed a single polyp in the cecum.  Pathology showed a 2 mm tubular adenoma. Colonoscopy otherwise notable only for extensive ++++diverticulosis.  I would not plan on a follow-up colonoscopy in 5 years unless the patient was symptomatic in some way.  10/15/2015 right breast mammogram was reviewed. Unremarkable  Assessment    Left upper extremity lymphedema in part secondary to noncompliance with compressive  sleeve.  Tiny tubular adenoma, typically warranting five-year follow-up.    Plan    The patient was again encouraged to make use of her lymphedema sleeve.  Dr. Rogue Bussing has been ordering her mammograms and will have this continue.    Patient to return in one year breast cancer check up . Dr. Rogue Bussing. Ordering mammogram.  This information has been scribed by Gaspar Cola CMA.  [name] 02/22/2016, 10:32 AM

## 2016-02-21 ENCOUNTER — Inpatient Hospital Stay: Payer: Medicare HMO | Attending: Internal Medicine | Admitting: Internal Medicine

## 2016-02-21 ENCOUNTER — Inpatient Hospital Stay: Payer: Medicare HMO

## 2016-02-21 VITALS — BP 148/71 | HR 63 | Temp 97.5°F | Wt 138.1 lb

## 2016-02-21 DIAGNOSIS — Z853 Personal history of malignant neoplasm of breast: Secondary | ICD-10-CM

## 2016-02-21 DIAGNOSIS — I890 Lymphedema, not elsewhere classified: Secondary | ICD-10-CM | POA: Insufficient documentation

## 2016-02-21 DIAGNOSIS — Z79811 Long term (current) use of aromatase inhibitors: Secondary | ICD-10-CM | POA: Insufficient documentation

## 2016-02-21 DIAGNOSIS — I10 Essential (primary) hypertension: Secondary | ICD-10-CM | POA: Diagnosis not present

## 2016-02-21 DIAGNOSIS — Z9221 Personal history of antineoplastic chemotherapy: Secondary | ICD-10-CM | POA: Insufficient documentation

## 2016-02-21 DIAGNOSIS — Z171 Estrogen receptor negative status [ER-]: Secondary | ICD-10-CM | POA: Insufficient documentation

## 2016-02-21 DIAGNOSIS — C884 Extranodal marginal zone b-cell lymphoma of mucosa-associated lymphoid tissue (malt-lymphoma) not having achieved remission: Secondary | ICD-10-CM

## 2016-02-21 DIAGNOSIS — C50812 Malignant neoplasm of overlapping sites of left female breast: Secondary | ICD-10-CM

## 2016-02-21 DIAGNOSIS — Z7982 Long term (current) use of aspirin: Secondary | ICD-10-CM | POA: Diagnosis not present

## 2016-02-21 DIAGNOSIS — Z8571 Personal history of Hodgkin lymphoma: Secondary | ICD-10-CM | POA: Diagnosis not present

## 2016-02-21 DIAGNOSIS — Z79899 Other long term (current) drug therapy: Secondary | ICD-10-CM | POA: Insufficient documentation

## 2016-02-21 DIAGNOSIS — E785 Hyperlipidemia, unspecified: Secondary | ICD-10-CM | POA: Insufficient documentation

## 2016-02-21 DIAGNOSIS — M818 Other osteoporosis without current pathological fracture: Secondary | ICD-10-CM | POA: Insufficient documentation

## 2016-02-21 LAB — CBC WITH DIFFERENTIAL/PLATELET
Basophils Absolute: 0 10*3/uL (ref 0–0.1)
Basophils Relative: 0 %
EOS ABS: 0.1 10*3/uL (ref 0–0.7)
Eosinophils Relative: 1 %
HCT: 37.2 % (ref 35.0–47.0)
HEMOGLOBIN: 12.6 g/dL (ref 12.0–16.0)
LYMPHS ABS: 1.7 10*3/uL (ref 1.0–3.6)
LYMPHS PCT: 39 %
MCH: 30.7 pg (ref 26.0–34.0)
MCHC: 33.9 g/dL (ref 32.0–36.0)
MCV: 90.6 fL (ref 80.0–100.0)
Monocytes Absolute: 0.8 10*3/uL (ref 0.2–0.9)
Monocytes Relative: 18 %
NEUTROS PCT: 42 %
Neutro Abs: 1.9 10*3/uL (ref 1.4–6.5)
Platelets: 219 10*3/uL (ref 150–440)
RBC: 4.11 MIL/uL (ref 3.80–5.20)
RDW: 13.1 % (ref 11.5–14.5)
WBC: 4.5 10*3/uL (ref 3.6–11.0)

## 2016-02-21 LAB — COMPREHENSIVE METABOLIC PANEL
ALK PHOS: 60 U/L (ref 38–126)
ALT: 15 U/L (ref 14–54)
AST: 21 U/L (ref 15–41)
Albumin: 4.1 g/dL (ref 3.5–5.0)
Anion gap: 7 (ref 5–15)
BUN: 16 mg/dL (ref 6–20)
CALCIUM: 9.1 mg/dL (ref 8.9–10.3)
CO2: 29 mmol/L (ref 22–32)
CREATININE: 0.59 mg/dL (ref 0.44–1.00)
Chloride: 102 mmol/L (ref 101–111)
GFR calc non Af Amer: >60 mL/min (ref >60)
GLUCOSE: 97 mg/dL (ref 65–99)
Potassium: 3.3 mmol/L — ABNORMAL LOW (ref 3.5–5.1)
SODIUM: 138 mmol/L (ref 135–145)
Total Bilirubin: 0.5 mg/dL (ref 0.3–1.2)
Total Protein: 7.7 g/dL (ref 6.5–8.1)

## 2016-02-21 NOTE — Assessment & Plan Note (Addendum)
#  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Clinically no evidence of recurrence. On Arimidex- no AEs noted. CT -Jan 2017- NED. Mammogram August 2017-BI-RADS category 1 Negative Will need repeat mammogram in Aug 2018.   # Elevated tumor marker CA 2729-  May 2017- Normal. Results are pending from today. Plan to re-check in 6 months.   # History of MALT lymphoma of the lung- clinically no evidence of progression noted. CT again no evidence of recurrence. Monitor.   # Osteoporosis surveillance-she is on fosomax/ vit D;  bone density May 2017 osteopenia. Will need repeat bone density in March. Patient states her PCP sets this up.   # follow up in 6 months/ labs- ca 27-29 

## 2016-02-21 NOTE — Progress Notes (Signed)
Patient here today for follow up.  Patient states no new concerns today  

## 2016-02-21 NOTE — Progress Notes (Signed)
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  She denies any unusual body aches and muscle aches joint pains. No nausea no vomiting. No headaches. No new lumps or bumps. She is currently wearing a sleeve for the left upper extremity lymphedema. This is improving. Intermittent hot flashes not any worse.    REVIEW OF SYSTEMS:  A complete 10 point review of system is done which is negative except mentioned above/history of present illness.   PAST MEDICAL HISTORY :  Past Medical History:  Diagnosis Date  . Breast cancer ([place]) 2015   left breast cancer  . Breast cancer metastasized to axillary lymph node ([place]) August 2015   T2, N1, ER negative, PR negative, HER-2 amplified. 2 cm axillary node. One of 11 nodes positive on post-adjuvant chemotherapy axillary  dissection. 3+ centimeter tumor.  . Hyperlipemia   . Hyperlipidemia   . Hypertension   . 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  Marland Kitchen MALT lymphoma ([place])   . Murmur   . Osteoporosis     PAST SURGICAL HISTORY :   Past Surgical History:  Procedure Laterality Date  . ABDOMINAL HYSTERECTOMY    . APPENDECTOMY    . BREAST SURGERY Left 02/25/14   Modified radical mastectomy, T2 N1. Grade 3, ER, PR negative, HER-2/neu 3+.  . COLONOSCOPY WITH PROPOFOL N/A 01/26/2016   Procedure: COLONOSCOPY WITH PROPOFOL;  Surgeon: [name], MD;  Location[facility];  Service: Endoscopy;  Laterality: N/A;  . MASTECTOMY Left 02-25-14   Dr Bary Castilla    FAMILY HISTORY :   Family History  Problem Relation Age of Onset  . Hypertension Mother   . Hypertension Maternal Aunt   . Hypertension Maternal Uncle   . Hypertension Maternal Aunt   . Hypertension Maternal Aunt   . Breast cancer Neg Hx     SOCIAL HISTORY:   Social History  Substance Use Topics  . Smoking status: Never Smoker  . Smokeless tobacco: Never Used  . Alcohol use No    ALLERGIES:  has No Known Allergies.  MEDICATIONS:  Current Outpatient Prescriptions  Medication Sig Dispense Refill  . alendronate (FOSAMAX) 70 MG tablet Take 70 mg by mouth once a week. Take with  a full glass of water on an empty stomach.    Marland Kitchen amLODipine (NORVASC) 2.5 MG tablet Take 2.5 mg by mouth daily.    Marland Kitchen anastrozole (ARIMIDEX) 1 MG tablet Take 1 tablet (1 mg total) by mouth daily. 90 tablet 4  . aspirin 81 MG tablet Take 81 mg by mouth daily.    . cholecalciferol (VITAMIN D) 1000 UNITS tablet Take 1,000 Units by mouth daily.    Marland Kitchen losartan-hydrochlorothiazide (HYZAAR) 100-25 MG per tablet Take 1 tablet by mouth daily.    . Multiple Vitamins-Minerals (MULTIVITAMIN WITH MINERALS) tablet Take 1 tablet by mouth daily.    . simvastatin (ZOCOR) 40 MG tablet Take 40 mg by mouth at bedtime.     No current  facility-administered medications for this visit.     PHYSICAL EXAMINATION: ECOG PERFORMANCE STATUS: 0 - Asymptomatic  BP (!) 148/71 (BP Location: Right Arm, Patient Position: Sitting)   Pulse 63   Temp 97.5 F (36.4 C) (Tympanic)   Wt 138 lb 2 oz (62.7 kg)   BMI 26.98 kg/m   Filed Weights   02/21/16 1227  Weight: 138 lb 2 oz (62.7 kg)    GENERAL: Well-nourished well-developed; Alert, no distress and comfortable.  With daughter/Husband. EYES: no pallor or icterus.  OROPHARYNX: no thrush or ulceration; poor dentition.no masses felt LYMPH:  no palpable lymphadenopathy in the cervical, axillary or inguinal regions LUNGS: clear to auscultation and  No wheeze or crackles HEART/CVS: regular rate & rhythm and no murmurs; No lower extremity edema; lymphedema noted in the left upper extremity/she is wearing a sleeve.  ABDOMEN:abdomen soft, non-tender and normal bowel sounds Musculoskeletal:no cyanosis of digits and no clubbing  PSYCH: alert & oriented x 3 with fluent speech NEURO: no focal motor/sensory deficits SKIN:  no rashes or significant lesions  LABORATORY DATA:  I have reviewed the data as listed    Component Value Date/Time   NA 138 02/21/2016 1135   NA 136 05/27/2014 0942   K 3.3 (L) 02/21/2016 1135   K 3.4 (L) 05/27/2014 0942   CL 102 02/21/2016 1135   CL 102 05/27/2014 0942   CO2 29 02/21/2016 1135   CO2 29 05/27/2014 0942   GLUCOSE 97 02/21/2016 1135   GLUCOSE 106 (H) 05/27/2014 0942   BUN 16 02/21/2016 1135   BUN 13 05/27/2014 0942   CREATININE 0.59 02/21/2016 1135   CREATININE 0.58 05/27/2014 0942   CALCIUM 9.1 02/21/2016 1135   CALCIUM 8.6 (L) 05/27/2014 0942   PROT 7.7 02/21/2016 1135   PROT 6.8 05/27/2014 0942   ALBUMIN 4.1 02/21/2016 1135   ALBUMIN 3.8 05/27/2014 0942   AST 21 02/21/2016 1135   AST 18 05/27/2014 0942   ALT 15 02/21/2016 1135   ALT 14 05/27/2014 0942   ALKPHOS 60 02/21/2016 1135   ALKPHOS 69 05/27/2014 0942   BILITOT 0.5  02/21/2016 1135   BILITOT 0.6 05/27/2014 0942   GFRNONAA >60 02/21/2016 1135   GFRNONAA >60 05/27/2014 0942   GFRAA >60 02/21/2016 1135   GFRAA >60 05/27/2014 0942    No results found for: SPEP, UPEP  Lab Results  Component Value Date   WBC 4.5 02/21/2016   NEUTROABS 1.9 02/21/2016   HGB 12.6 02/21/2016   HCT 37.2 02/21/2016   MCV 90.6 02/21/2016   PLT 219 02/21/2016      Chemistry      Component Value Date/Time   NA 138 02/21/2016 1135   NA 136 05/27/2014 0942   K 3.3 (L)  02/21/2016 1135   K 3.4 (L) 05/27/2014 0942   CL 102 02/21/2016 1135   CL 102 05/27/2014 0942   CO2 29 02/21/2016 1135   CO2 29 05/27/2014 0942   BUN 16 02/21/2016 1135   BUN 13 05/27/2014 0942   CREATININE 0.59 02/21/2016 1135   CREATININE 0.58 05/27/2014 0942      Component Value Date/Time   CALCIUM 9.1 02/21/2016 1135   CALCIUM 8.6 (L) 05/27/2014 0942   ALKPHOS 60 02/21/2016 1135   ALKPHOS 69 05/27/2014 0942   AST 21 02/21/2016 1135   AST 18 05/27/2014 0942   ALT 15 02/21/2016 1135   ALT 14 05/27/2014 0942   BILITOT 0.5 02/21/2016 1135   BILITOT 0.6 05/27/2014 0942        ASSESSMENT & PLAN:   Cancer of overlapping sites of left female breast ([place]) # 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Clinically no evidence of recurrence. On Arimidex- no AEs noted. CT -Jan 2017- NED. Mammogram August 2017-BI-RADS category 1 Negative Will need repeat mammogram in Aug 2018.   # Elevated tumor marker CA 2729-  May 2017- Normal. Results are pending from today. Plan to re-check in 6 months.   # History of MALT lymphoma of the lung- clinically no evidence of progression noted. CT again no evidence of recurrence. Monitor.   # Osteoporosis surveillance-she is on fosomax/ vit D;  bone density May 2017 osteopenia. Will need repeat bone density in March. Patient states her PCP sets this up.   # follow up in 6 months/ labs- ca  27-29   Faythe Casa, NP    Cammie Sickle, MD 02/22/2016 8:31 AM

## 2016-02-22 ENCOUNTER — Telehealth: Payer: Self-pay | Admitting: Internal Medicine

## 2016-02-22 LAB — CANCER ANTIGEN 27.29: CA 27.29: 44.5 U/mL — AB (ref 0.0–38.6)

## 2016-02-22 NOTE — Telephone Encounter (Signed)
Anderson [place]- please inform patient's daughter that CA 2729 is slightly elevated at 44.5; recommend keeping the port for now; needs to have it flushed every 8 weeks.

## 2016-02-23 NOTE — Telephone Encounter (Signed)
Done

## 2016-03-27 IMAGING — CT CT CHEST W/ CM
2 of 3 series · 15 of 36 positions shown, 18 images · IV contrast (omnipaque)
Comparison: Multiple exams, including 05/08/2014

CLINICAL DATA: Left breast cancer. Right pulmonary MALT lymphoma.
Chemotherapy.

EXAM:
CT CHEST WITH CONTRAST
TECHNIQUE: Multidetector CT imaging of the chest was performed during
intravenous contrast administration.
CONTRAST:  75mL OMNIPAQUE IOHEXOL 300 MG/ML  SOLN

[Series 3: routine chest with · axial · 0.55mm/px · z∈[-600,-420]mm · 12 of 44 slices shown, 15 images]
[im 4/44  mediastinal]
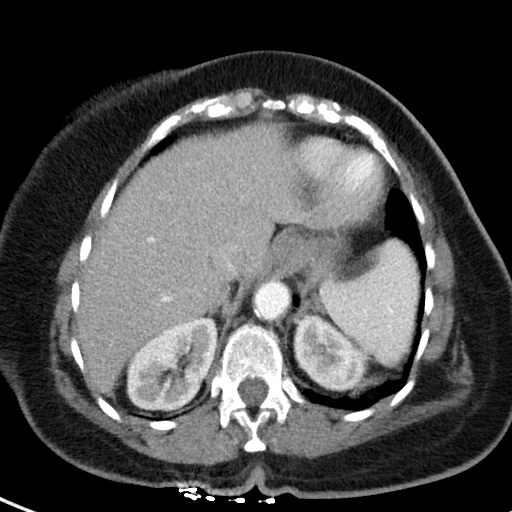
[im 4/44  lung]
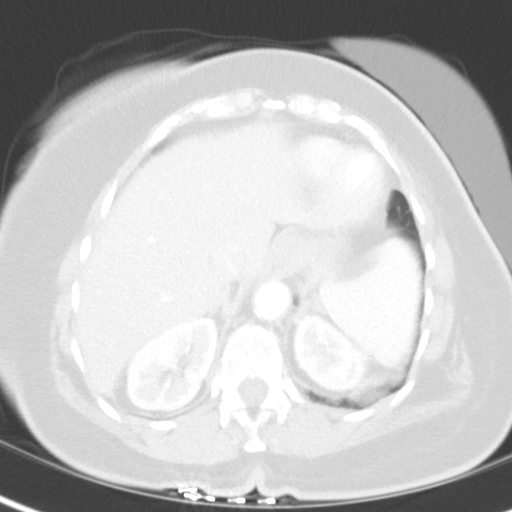
[im 7/44  lung]
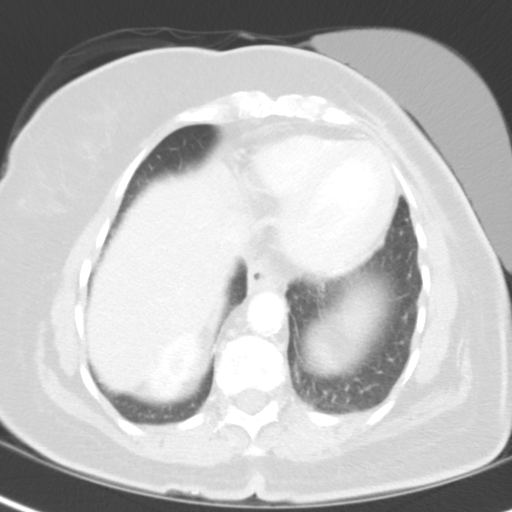
[im 10/44  lung]
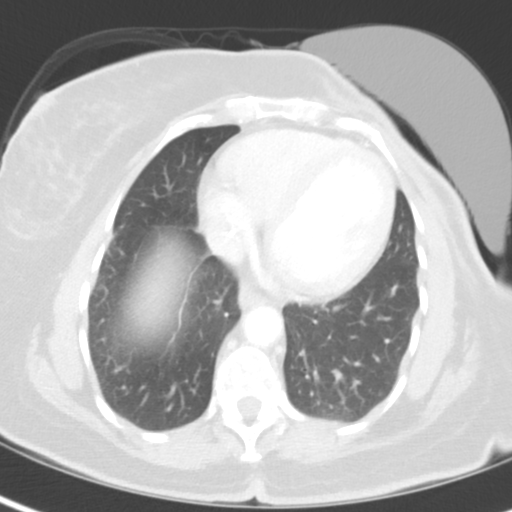
[im 13/44  lung]
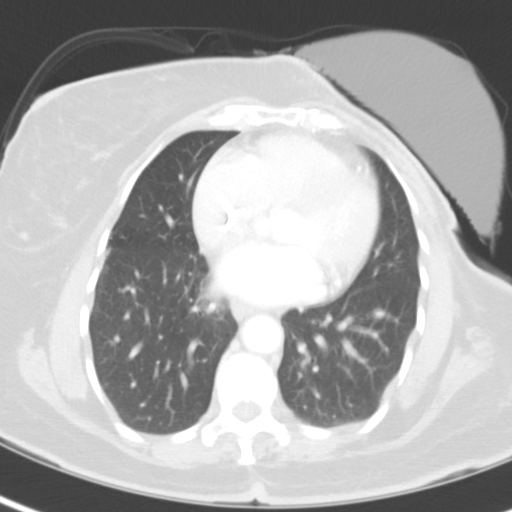
[im 16/44  mediastinal]
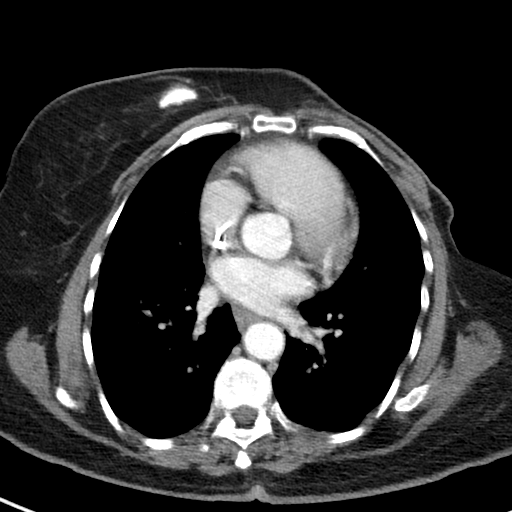
[im 16/44  lung]
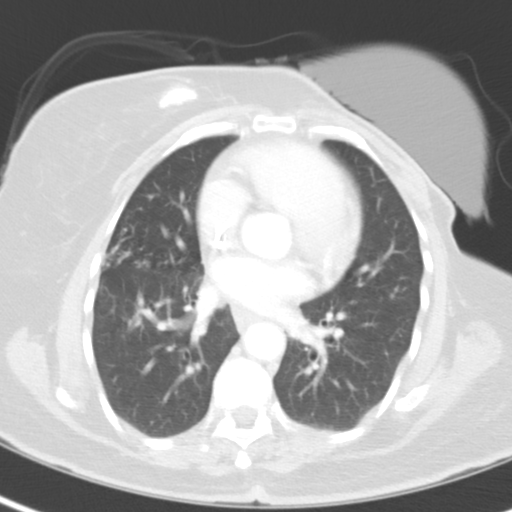
[im 20/44  lung]
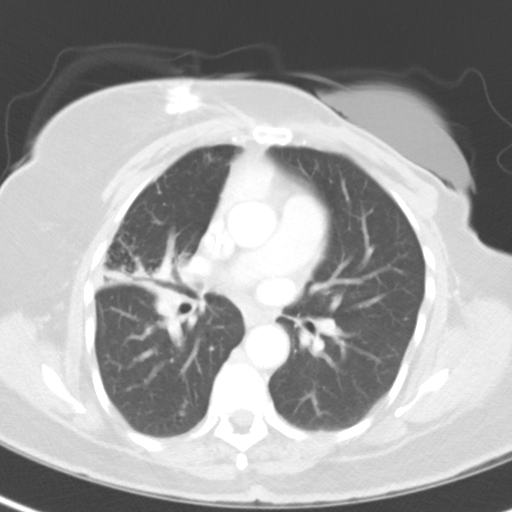
[im 24/44  lung]
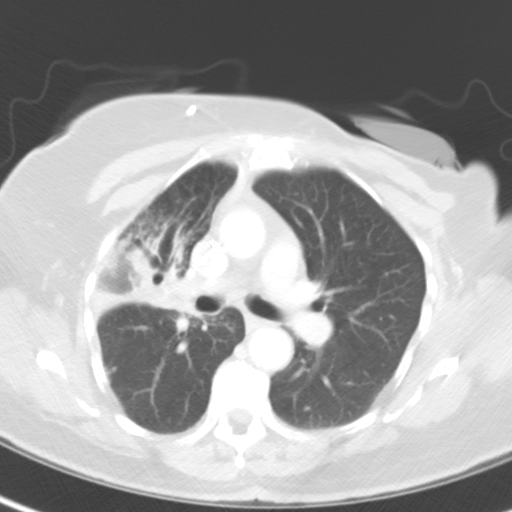
[im 28/44  lung]
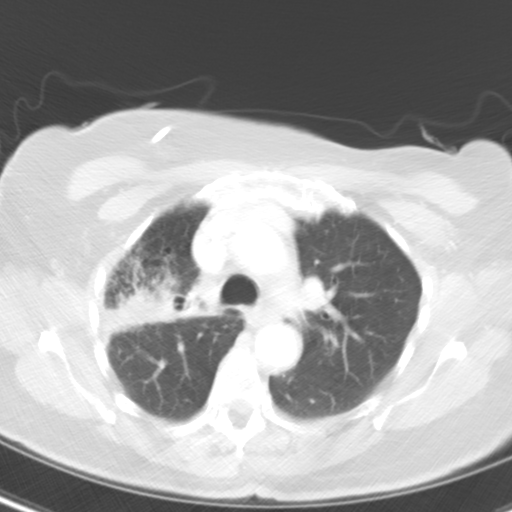
[im 31/44  mediastinal]
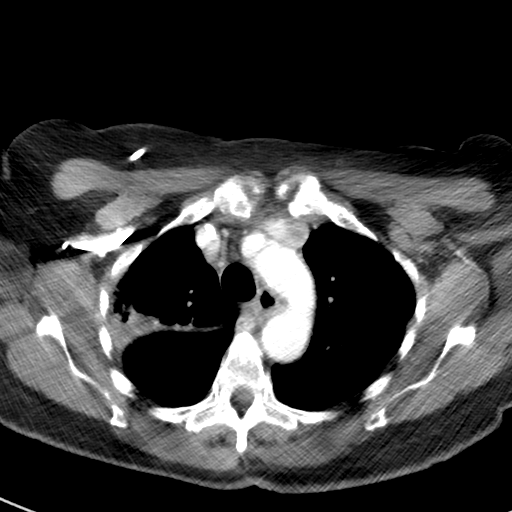
[im 31/44  lung]
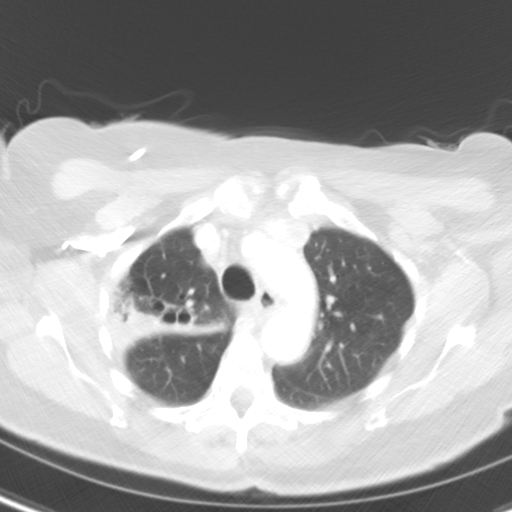
[im 34/44  lung]
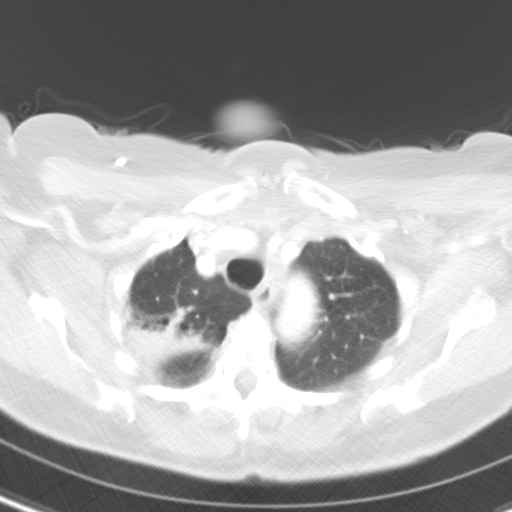
[im 37/44  lung]
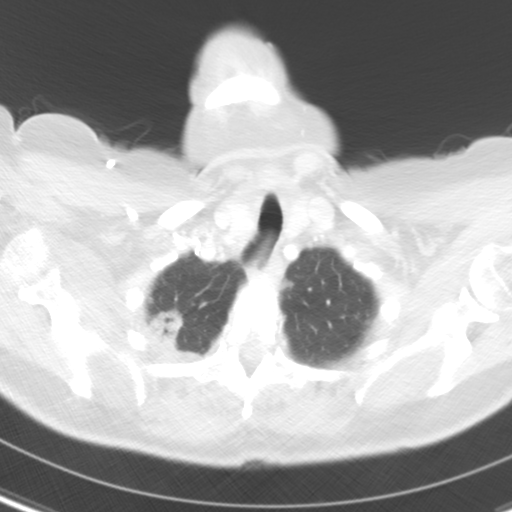
[im 40/44  lung]
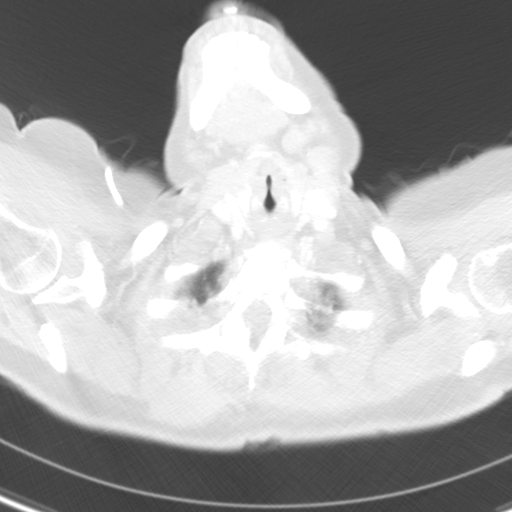

[Series 5: coronal · coronal · 0.46mm/px · 3 of 108 slices shown]
[im 22/108  lung]
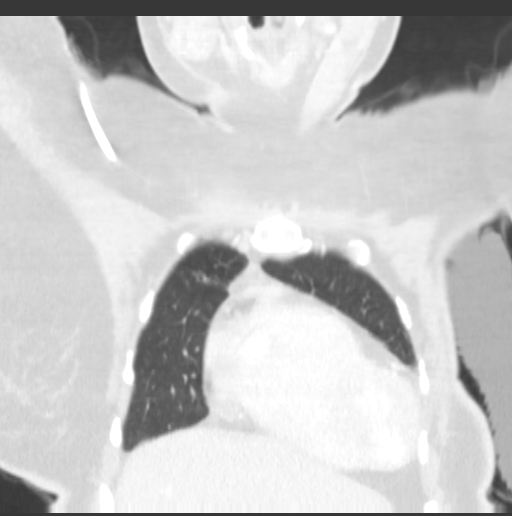
[im 43/108  lung]
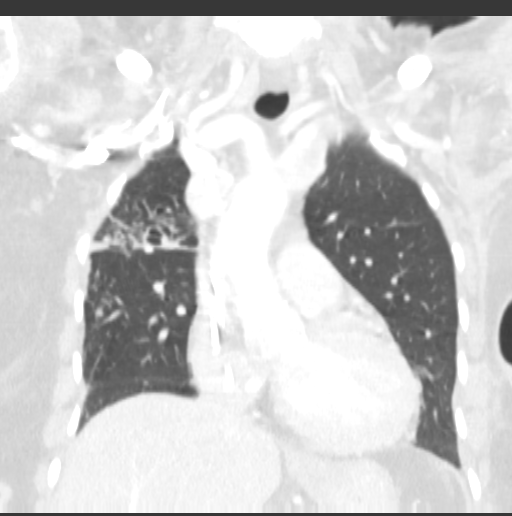
[im 65/108  lung]
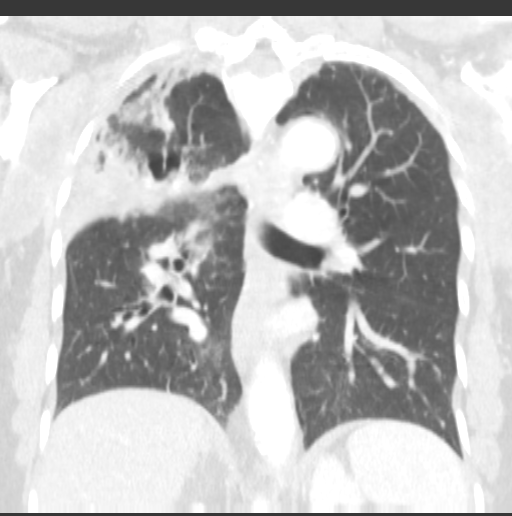

[15 of 36 positions shown; findings below may reference images not displayed]

FINDINGS: Streak artifact in the upper chest due to suboptimal arm positioning
-the patient was a unable to hold the arms over the head.

Mediastinum/Nodes: Mild aortic atherosclerotic vascular disease with
tortuosity of the thoracic aorta. In indistinctly marginated lymph
node anterior to the carina measures 9 mm in short axis, previously
8 mm. Left mastectomy.

Lungs/Pleura: Similar appearance of bandlike airspace opacity in the
right upper lobe especially anterior to the major fissure, with some
adjacent cylindrical bronchiectasis in the right upper lobe and in
the right middle lobe. The opacity also tracks into the right middle
lobe along the fissure, although less strikingly than the upper
lobe. The appearance is not appreciably changed in the right lung.

Upper abdomen: Unremarkable

Musculoskeletal: Degenerative sternoclavicular arthropathy
bilaterally.

Thoracic kyphosis with prominence of epidural adipose tissue
posteriorly along the mid in upper thoracic spine.
IMPRESSION: 1. Stable airspace opacity with associated cylindrical
bronchiectasis tracking anterior to the right major fissure in the
right upper lobe and to a lesser extent the right middle lobe. Some
of this may reflect radiation therapy, but underlying residual
pulmonary lymphoma is not excluded. No overt adenopathy.
2. Thoracic epidural lipomatosis and thoracic kyphosis.
3. Mild atherosclerosis.

## 2016-03-27 IMAGING — MG MM SCREENING BREAST TOMO UNI R
7 series · 9 of 15 positions shown · non-contrast
Comparison: Previous exam(s).

CLINICAL DATA: Screening. History of left breast cancer post
interval mastectomy and chemotherapy.

EXAM:
DIGITAL SCREENING UNILATERAL RIGHT MAMMOGRAM WITH TOMO AND CAD

[R MLO (1 of 2)]
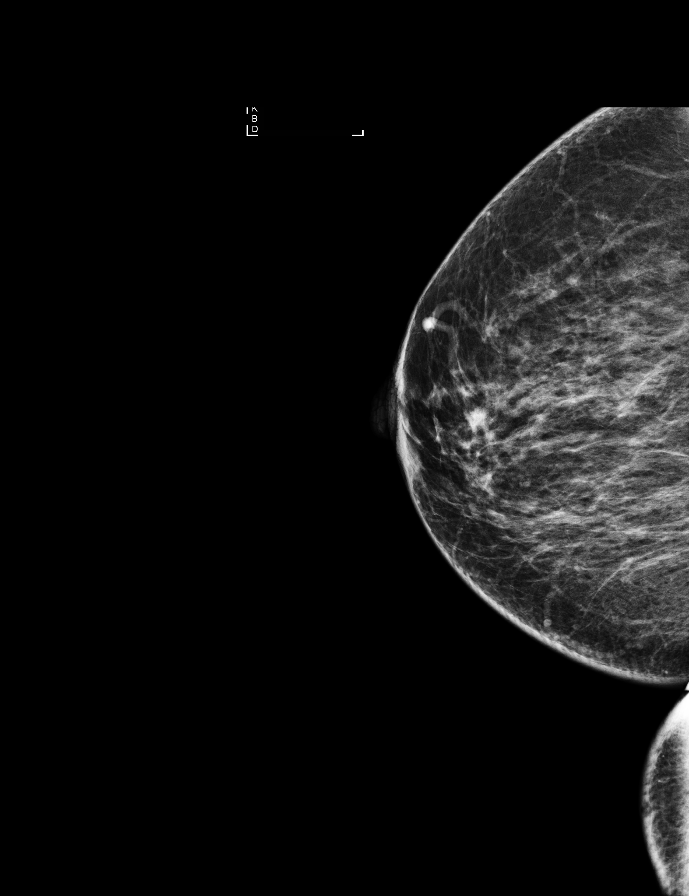

[R MLO (2 of 2)]
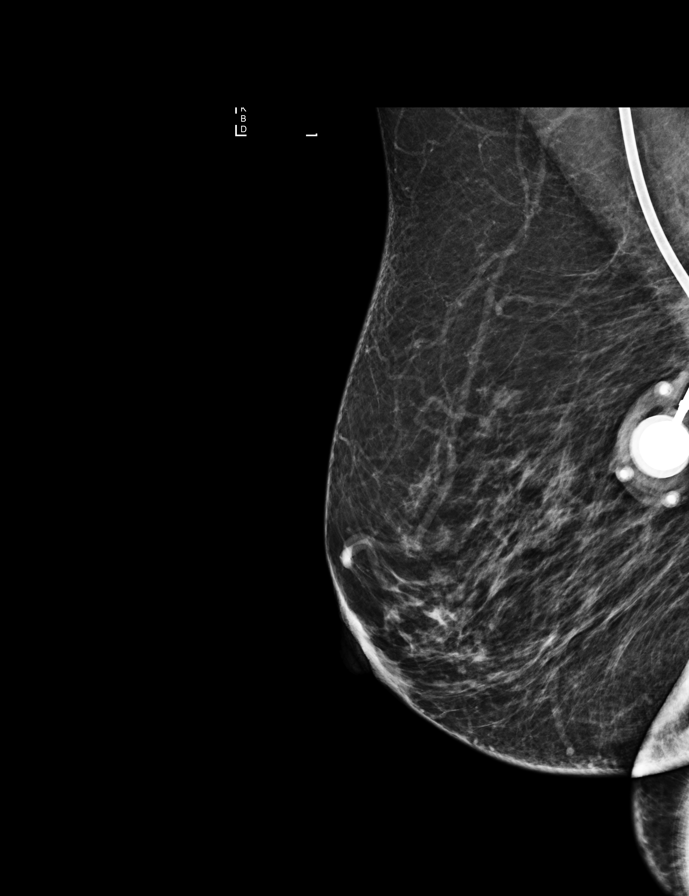

[R MLO synth-2D]
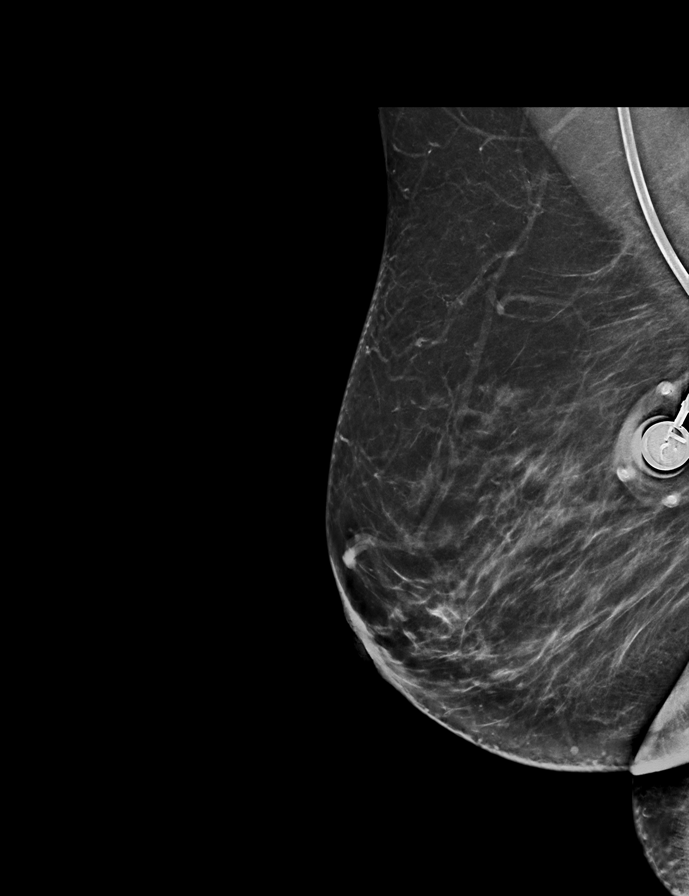

[R CC]
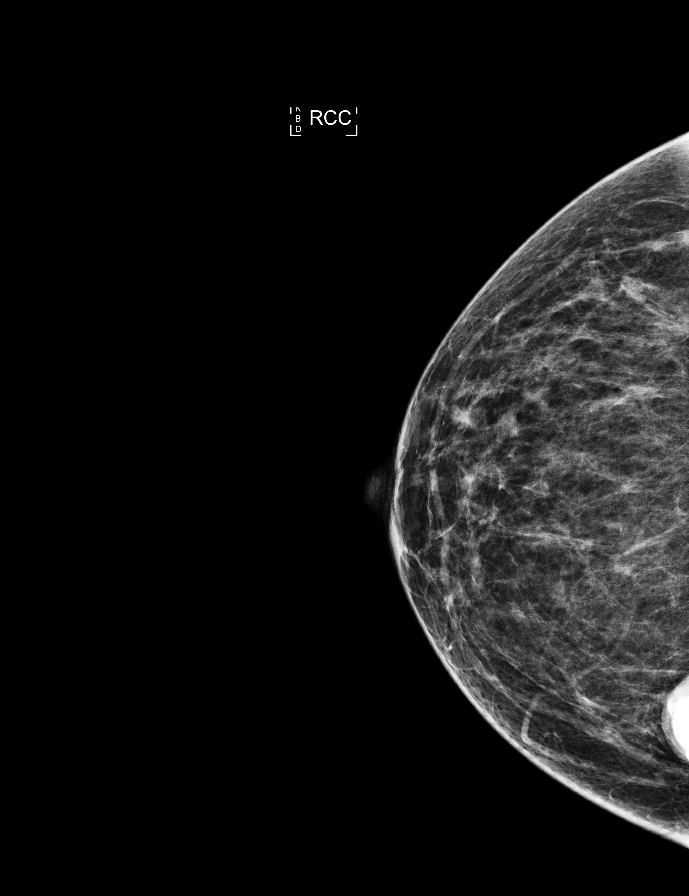

[R CC synth-2D]
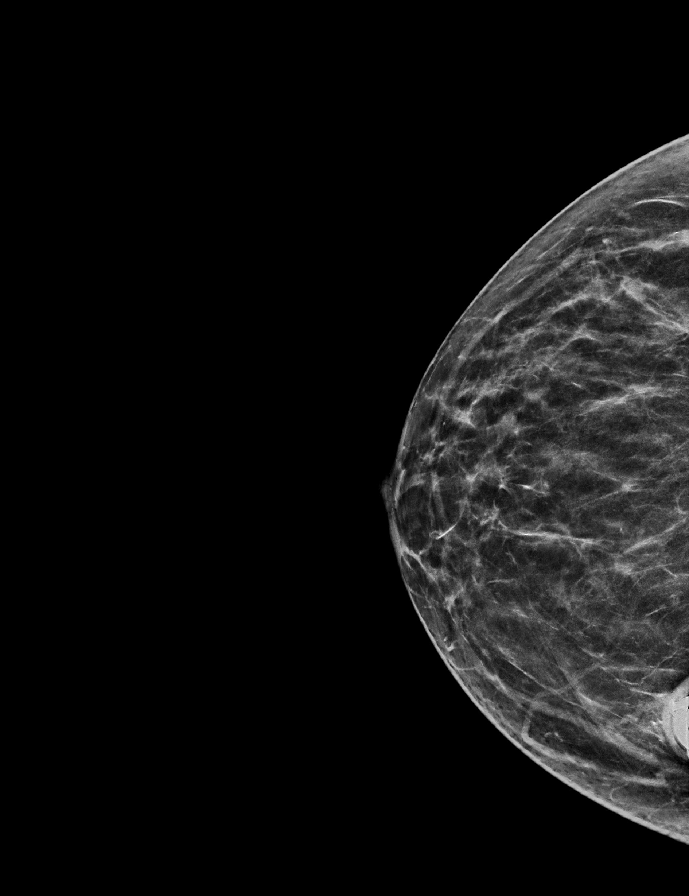

[R CC tomo · 3 of 58 frames shown]
[frame 19/58]
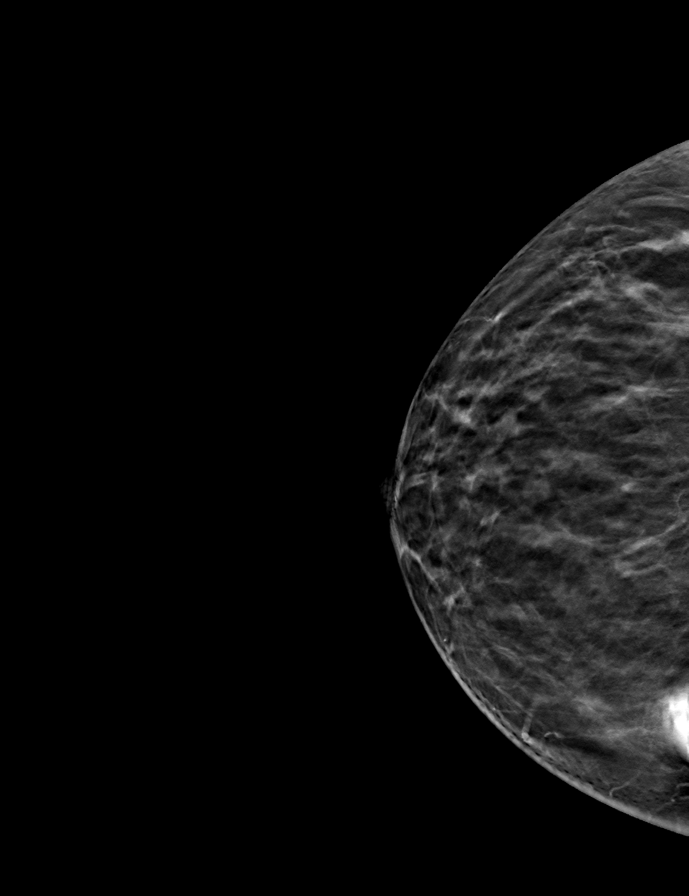
[frame 29/58]
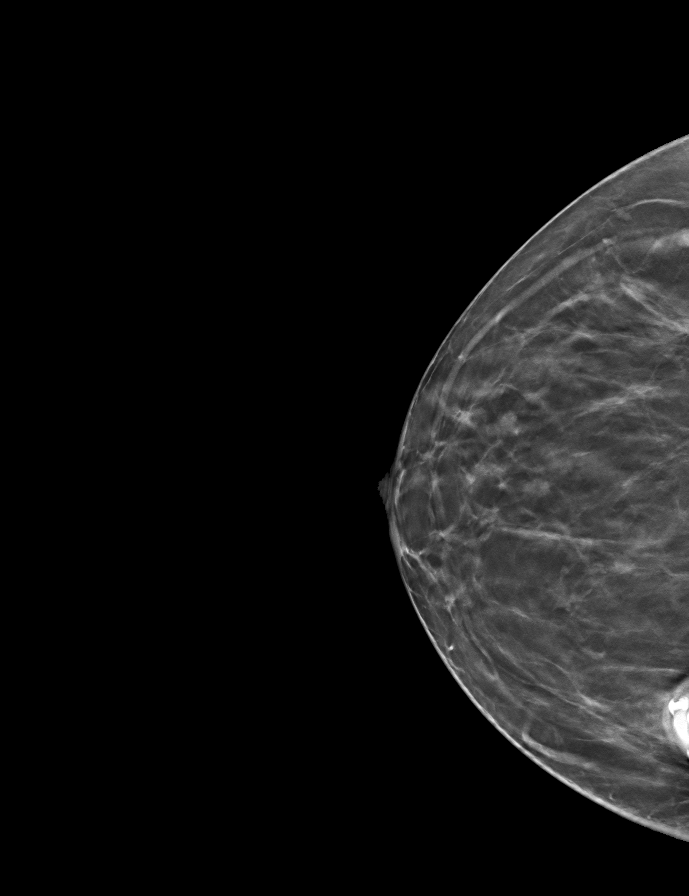
[frame 40/58]
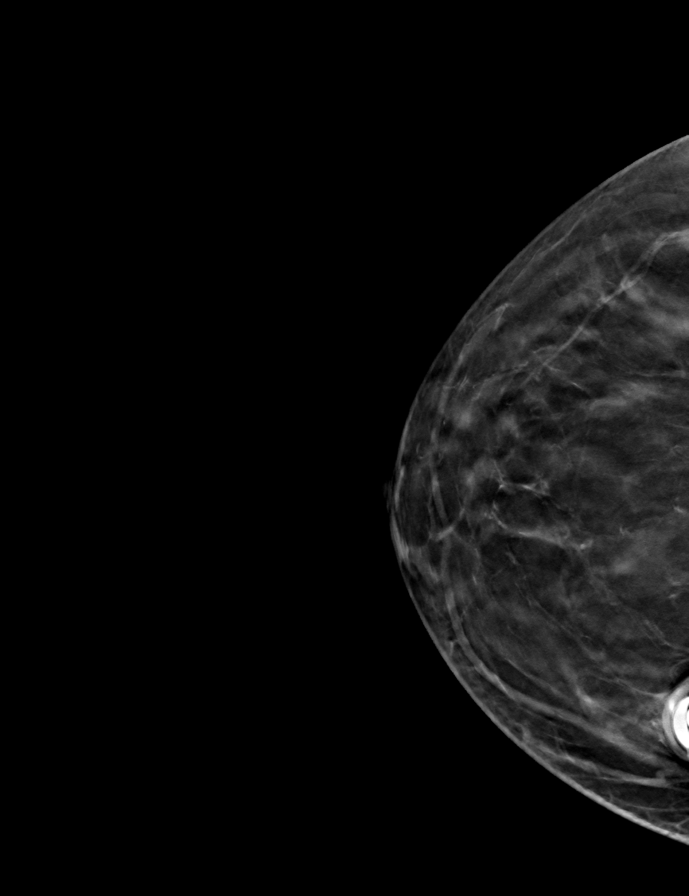

[R MLO tomo · tomo slice 36/71.0]
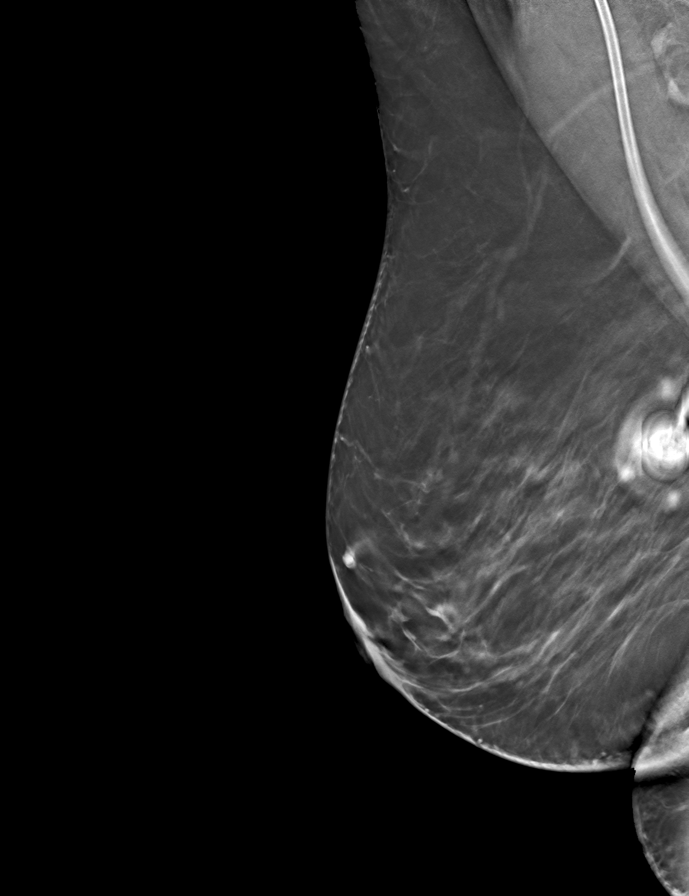

[9 of 15 positions shown; findings below may reference images not displayed]

ACR Breast Density Category c: The breast tissue is heterogeneously
dense, which may obscure small masses.
FINDINGS: There are no findings suspicious for malignancy. A right chest wall
port is partially visualized. Images were processed with CAD.
IMPRESSION: No mammographic evidence of malignancy. A result letter of this
screening mammogram will be mailed directly to the patient.

RECOMMENDATION:
Screening mammogram in one year. (Code:M9-5-SX0)

BI-RADS CATEGORY  1: Negative.

## 2016-10-17 ENCOUNTER — Ambulatory Visit
Admission: RE | Admit: 2016-10-17 | Discharge: 2016-10-17 | Disposition: A | Discharge status: Home / Self Care | Payer: Medicare HMO | Source: Ambulatory Visit | Attending: Oncology | Admitting: Oncology

## 2016-10-17 DIAGNOSIS — Z853 Personal history of malignant neoplasm of breast: Secondary | ICD-10-CM

## 2016-10-17 DIAGNOSIS — Z1231 Encounter for screening mammogram for malignant neoplasm of breast: Secondary | ICD-10-CM | POA: Insufficient documentation

## 2016-10-19 ENCOUNTER — Inpatient Hospital Stay (HOSPITAL_BASED_OUTPATIENT_CLINIC_OR_DEPARTMENT_OTHER): Payer: Medicare HMO | Admitting: Internal Medicine

## 2016-10-19 ENCOUNTER — Other Ambulatory Visit: Payer: Self-pay

## 2016-10-19 ENCOUNTER — Inpatient Hospital Stay: Payer: Medicare HMO | Attending: Internal Medicine

## 2016-10-19 VITALS — BP 155/73 | HR 76 | Temp 97.4°F | Resp 16 | Wt 134.4 lb

## 2016-10-19 DIAGNOSIS — Z79899 Other long term (current) drug therapy: Secondary | ICD-10-CM | POA: Insufficient documentation

## 2016-10-19 DIAGNOSIS — Z9221 Personal history of antineoplastic chemotherapy: Secondary | ICD-10-CM | POA: Diagnosis not present

## 2016-10-19 DIAGNOSIS — C884 Extranodal marginal zone B-cell lymphoma of mucosa-associated lymphoid tissue [MALT-lymphoma]: Secondary | ICD-10-CM

## 2016-10-19 DIAGNOSIS — Z9012 Acquired absence of left breast and nipple: Secondary | ICD-10-CM | POA: Diagnosis not present

## 2016-10-19 DIAGNOSIS — Z452 Encounter for adjustment and management of vascular access device: Secondary | ICD-10-CM | POA: Diagnosis not present

## 2016-10-19 DIAGNOSIS — E785 Hyperlipidemia, unspecified: Secondary | ICD-10-CM | POA: Diagnosis not present

## 2016-10-19 DIAGNOSIS — I972 Postmastectomy lymphedema syndrome: Secondary | ICD-10-CM | POA: Insufficient documentation

## 2016-10-19 DIAGNOSIS — I10 Essential (primary) hypertension: Secondary | ICD-10-CM | POA: Diagnosis not present

## 2016-10-19 DIAGNOSIS — M810 Age-related osteoporosis without current pathological fracture: Secondary | ICD-10-CM | POA: Diagnosis not present

## 2016-10-19 DIAGNOSIS — Z8571 Personal history of Hodgkin lymphoma: Secondary | ICD-10-CM | POA: Insufficient documentation

## 2016-10-19 DIAGNOSIS — Z95828 Presence of other vascular implants and grafts: Secondary | ICD-10-CM

## 2016-10-19 DIAGNOSIS — Z171 Estrogen receptor negative status [ER-]: Secondary | ICD-10-CM

## 2016-10-19 DIAGNOSIS — C773 Secondary and unspecified malignant neoplasm of axilla and upper limb lymph nodes: Secondary | ICD-10-CM | POA: Insufficient documentation

## 2016-10-19 DIAGNOSIS — Z79811 Long term (current) use of aromatase inhibitors: Secondary | ICD-10-CM | POA: Insufficient documentation

## 2016-10-19 DIAGNOSIS — C50812 Malignant neoplasm of overlapping sites of left female breast: Secondary | ICD-10-CM | POA: Insufficient documentation

## 2016-10-19 DIAGNOSIS — Z7982 Long term (current) use of aspirin: Secondary | ICD-10-CM

## 2016-10-19 DIAGNOSIS — Z853 Personal history of malignant neoplasm of breast: Secondary | ICD-10-CM

## 2016-10-19 DIAGNOSIS — C50912 Malignant neoplasm of unspecified site of left female breast: Secondary | ICD-10-CM

## 2016-10-19 LAB — CBC WITH DIFFERENTIAL/PLATELET
BASOS ABS: 0 10*3/uL (ref 0–0.1)
BASOS PCT: 1 %
EOS ABS: 0 10*3/uL (ref 0–0.7)
EOS PCT: 1 %
HCT: 36.3 % (ref 35.0–47.0)
Hemoglobin: 12.6 g/dL (ref 12.0–16.0)
LYMPHS PCT: 39 %
Lymphs Abs: 2.2 10*3/uL (ref 1.0–3.6)
MCH: 31.4 pg (ref 26.0–34.0)
MCHC: 34.8 g/dL (ref 32.0–36.0)
MCV: 90.3 fL (ref 80.0–100.0)
Monocytes Absolute: 1 10*3/uL — ABNORMAL HIGH (ref 0.2–0.9)
Monocytes Relative: 19 %
Neutro Abs: 2.3 10*3/uL (ref 1.4–6.5)
Neutrophils Relative %: 40 %
PLATELETS: 233 10*3/uL (ref 150–440)
RBC: 4.02 MIL/uL (ref 3.80–5.20)
RDW: 13.6 % (ref 11.5–14.5)
WBC: 5.6 10*3/uL (ref 3.6–11.0)

## 2016-10-19 LAB — BASIC METABOLIC PANEL
Anion gap: 8 (ref 5–15)
BUN: 16 mg/dL (ref 6–20)
CALCIUM: 9.2 mg/dL (ref 8.9–10.3)
CHLORIDE: 99 mmol/L — AB (ref 101–111)
CO2: 31 mmol/L (ref 22–32)
CREATININE: 0.82 mg/dL (ref 0.44–1.00)
GFR calc Af Amer: >60 mL/min (ref >60)
GFR calc non Af Amer: >60 mL/min (ref >60)
Glucose, Bld: 95 mg/dL (ref 65–99)
Potassium: 3.4 mmol/L — ABNORMAL LOW (ref 3.5–5.1)
SODIUM: 138 mmol/L (ref 135–145)

## 2016-10-19 MED ORDER — SODIUM CHLORIDE 0.9% FLUSH
10.0000 mL | INTRAVENOUS | Status: DC | PRN
  Administered 2016-10-19: 10 mL via INTRAVENOUS
  Filled 2016-10-19: qty 10

## 2016-10-19 MED ORDER — HEPARIN SOD (PORK) LOCK FLUSH 100 UNIT/ML IV SOLN
500.0000 [IU] | Freq: Once | INTRAVENOUS | Status: AC
  Administered 2016-10-19: 500 [IU] via INTRAVENOUS

## 2016-10-19 NOTE — Assessment & Plan Note (Addendum)
#  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 Clinically no evidence of recurrence. Continue arimidex for now.   # Elevated tumor marker CA 27-29-  Jan 2018- slightly up- Normal. Results are pending from today. Plan to re-check in 6 months.   # History of MALT lymphoma of the lung- clinically no evidence of progression noted.   # Osteoporosis surveillance-she is on fosomax/ vit D;  bone density May 2017 osteopenia.    # port flush every 2 months-   # follow up in 6 months/ labs- ca 27-29/port flush.  

## 2016-10-19 NOTE — Progress Notes (Signed)
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 No nausea no vomiting. No headaches. No new lumps or bumps. Intermittent hot flashes not any worse.    REVIEW OF SYSTEMS:  A complete 10 point review of system is done which is negative except mentioned above/history of present illness.   PAST MEDICAL HISTORY :  Past Medical History:  Diagnosis Date  . Breast cancer ([place]) 2015   left breast cancer  . Breast cancer metastasized to axillary lymph node ([place]) August 2015   T2, N1, ER negative, PR negative, HER-2 amplified. 2 cm axillary node. One of 11 nodes positive on post-adjuvant chemotherapy axillary dissection. 3+ centimeter tumor.  .  Hyperlipemia   . Hyperlipidemia   . Hypertension   . 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  Marland Kitchen MALT lymphoma ([place])   . Murmur   . Osteoporosis     PAST SURGICAL HISTORY :   Past Surgical History:  Procedure Laterality Date  . ABDOMINAL HYSTERECTOMY    . APPENDECTOMY    . BREAST SURGERY Left 02/25/14   Modified radical mastectomy, T2 N1. Grade 3, ER, PR negative, HER-2/neu 3+.  . COLONOSCOPY WITH PROPOFOL N/A 01/26/2016   Procedure: COLONOSCOPY WITH PROPOFOL;  Surgeon: [name], MD;  Location[facility] ENDOSCOPY;  Service: Endoscopy;  Laterality: N/A;  . MASTECTOMY Left 02-25-14   Dr Bary Castilla    FAMILY HISTORY :   Family History  Problem Relation Age of Onset  . Hypertension Mother   . Hypertension Maternal Aunt   . Hypertension Maternal Uncle   . Hypertension Maternal Aunt   . Hypertension Maternal Aunt   . Breast cancer Neg Hx     SOCIAL HISTORY:   Social History  Substance Use Topics  . Smoking status: Never Smoker  . Smokeless tobacco: Never Used  . Alcohol use No    ALLERGIES:  has No Known Allergies.  MEDICATIONS:  Current Outpatient Prescriptions  Medication Sig Dispense Refill  . alendronate (FOSAMAX) 70 MG tablet Take 70 mg by mouth once a week. Take with a full glass of water on an empty stomach.    Marland Kitchen  amLODipine (NORVASC) 2.5 MG tablet Take 2.5 mg by mouth daily.    Marland Kitchen anastrozole (ARIMIDEX) 1 MG tablet Take 1 tablet (1 mg total) by mouth daily. 90 tablet 4  . aspirin 81 MG tablet Take 81 mg by mouth daily.    . cholecalciferol (VITAMIN D) 1000 UNITS tablet Take 1,000 Units by mouth daily.    Marland Kitchen losartan-hydrochlorothiazide (HYZAAR) 100-25 MG per tablet Take 1 tablet by mouth daily.    . Multiple Vitamins-Minerals (MULTIVITAMIN WITH MINERALS) tablet Take 1 tablet by mouth daily.    . simvastatin (ZOCOR) 40 MG tablet Take 40 mg by mouth at bedtime.     No current facility-administered medications for  this visit.    Facility-Administered Medications Ordered in Other Visits  Medication Dose Route Frequency Provider Last Rate Last Dose  . sodium chloride flush (NS) 0.9 % injection 10 mL  10 mL Intravenous PRN Earna Coder, MD   10 mL at 10/19/16 1054    PHYSICAL EXAMINATION: ECOG PERFORMANCE STATUS: 0 - Asymptomatic  BP (!) 155/73 (BP Location: Left Arm, Patient Position: Sitting)   Pulse 76   Temp (!) 97.4 F (36.3 C) (Tympanic)   Resp 16   Wt 134 lb 6.4 oz (61 kg)   BMI 26.25 kg/m   Filed Weights   10/19/16 1112  Weight: 134 lb 6.4 oz (61 kg)    GENERAL: Well-nourished well-developed; Alert, no distress and comfortable.She is alone.  EYES: no pallor or icterus.  OROPHARYNX: no thrush or ulceration; poor dentition.no masses felt LYMPH:  no palpable lymphadenopathy in the cervical, axillary or inguinal regions LUNGS: clear to auscultation and  No wheeze or crackles HEART/CVS: regular rate & rhythm and no murmurs; No lower extremity edema; lymphedema noted in the left upper extremity/she is wearing a sleeve.  ABDOMEN:abdomen soft, non-tender and normal bowel sounds Musculoskeletal:no cyanosis of digits and no clubbing  PSYCH: alert & oriented x 3 with fluent speech NEURO: no focal motor/sensory deficits SKIN:  no rashes or significant lesions  LABORATORY DATA:  I have reviewed the data as listed    Component Value Date/Time   NA 138 10/19/2016 1036   NA 136 05/27/2014 0942   K 3.4 (L) 10/19/2016 1036   K 3.4 (L) 05/27/2014 0942   CL 99 (L) 10/19/2016 1036   CL 102 05/27/2014 0942   CO2 31 10/19/2016 1036   CO2 29 05/27/2014 0942   GLUCOSE 95 10/19/2016 1036   GLUCOSE 106 (H) 05/27/2014 0942   BUN 16 10/19/2016 1036   BUN 13 05/27/2014 0942   CREATININE 0.82 10/19/2016 1036   CREATININE 0.58 05/27/2014 0942   CALCIUM 9.2 10/19/2016 1036   CALCIUM 8.6 (L) 05/27/2014 0942   PROT 7.7 02/21/2016 1135   PROT 6.8 05/27/2014 0942   ALBUMIN 4.1 02/21/2016  1135   ALBUMIN 3.8 05/27/2014 0942   AST 21 02/21/2016 1135   AST 18 05/27/2014 0942   ALT 15 02/21/2016 1135   ALT 14 05/27/2014 0942   ALKPHOS 60 02/21/2016 1135   ALKPHOS 69 05/27/2014 0942   BILITOT 0.5 02/21/2016 1135   BILITOT 0.6 05/27/2014 0942   GFRNONAA >60 10/19/2016 1036   GFRNONAA >60 05/27/2014 0942   GFRAA >60 10/19/2016 1036   GFRAA >60 05/27/2014 0942    No results found for: SPEP, UPEP  Lab Results  Component Value Date   WBC 5.6 10/19/2016   NEUTROABS 2.3 10/19/2016   HGB 12.6 10/19/2016   HCT 36.3 10/19/2016   MCV  90.3 10/19/2016   PLT 233 10/19/2016      Chemistry      Component Value Date/Time   NA 138 10/19/2016 1036   NA 136 05/27/2014 0942   K 3.4 (L) 10/19/2016 1036   K 3.4 (L) 05/27/2014 0942   CL 99 (L) 10/19/2016 1036   CL 102 05/27/2014 0942   CO2 31 10/19/2016 1036   CO2 29 05/27/2014 0942   BUN 16 10/19/2016 1036   BUN 13 05/27/2014 0942   CREATININE 0.82 10/19/2016 1036   CREATININE 0.58 05/27/2014 0942      Component Value Date/Time   CALCIUM 9.2 10/19/2016 1036   CALCIUM 8.6 (L) 05/27/2014 0942   ALKPHOS 60 02/21/2016 1135   ALKPHOS 69 05/27/2014 0942   AST 21 02/21/2016 1135   AST 18 05/27/2014 0942   ALT 15 02/21/2016 1135   ALT 14 05/27/2014 0942   BILITOT 0.5 02/21/2016 1135   BILITOT 0.6 05/27/2014 0942        ASSESSMENT & PLAN:   Mucosa-associated lymphoid tissue (MALT) lymphoma ([facility]) # 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 Clinically no evidence of recurrence. Continue arimidex for now.   # Elevated tumor marker CA 27-29-  Jan 2018- slightly up- Normal. Results are pending from today. Plan to re-check in 6 months.   # History of MALT lymphoma of the lung- clinically no evidence of progression noted.   # Osteoporosis surveillance-she is on fosomax/ vit D;  bone density May 2017 osteopenia.    # port flush every 2 months-   #  follow up in 6 months/ labs- ca 27-29/port flush.      Cammie Sickle, MD 10/29/2016 8:24 PM

## 2016-10-19 NOTE — Progress Notes (Signed)
Patient is here today for a follow up. Patient reports no new concerns today.  

## 2016-10-20 LAB — CANCER ANTIGEN 27.29: CA 27.29: 38.6 U/mL (ref 0.0–38.6)

## 2016-11-15 ENCOUNTER — Other Ambulatory Visit: Payer: Self-pay | Admitting: Internal Medicine

## 2016-11-15 DIAGNOSIS — Z1231 Encounter for screening mammogram for malignant neoplasm of breast: Secondary | ICD-10-CM

## 2016-12-21 ENCOUNTER — Inpatient Hospital Stay: Payer: Medicare HMO | Attending: Internal Medicine

## 2016-12-21 DIAGNOSIS — C50812 Malignant neoplasm of overlapping sites of left female breast: Secondary | ICD-10-CM | POA: Insufficient documentation

## 2016-12-21 DIAGNOSIS — Z9221 Personal history of antineoplastic chemotherapy: Secondary | ICD-10-CM | POA: Insufficient documentation

## 2016-12-21 DIAGNOSIS — Z79899 Other long term (current) drug therapy: Secondary | ICD-10-CM | POA: Insufficient documentation

## 2016-12-21 DIAGNOSIS — M810 Age-related osteoporosis without current pathological fracture: Secondary | ICD-10-CM | POA: Insufficient documentation

## 2016-12-21 DIAGNOSIS — E785 Hyperlipidemia, unspecified: Secondary | ICD-10-CM | POA: Diagnosis not present

## 2016-12-21 DIAGNOSIS — Z452 Encounter for adjustment and management of vascular access device: Secondary | ICD-10-CM | POA: Diagnosis not present

## 2016-12-21 DIAGNOSIS — C773 Secondary and unspecified malignant neoplasm of axilla and upper limb lymph nodes: Secondary | ICD-10-CM | POA: Diagnosis not present

## 2016-12-21 DIAGNOSIS — Z171 Estrogen receptor negative status [ER-]: Secondary | ICD-10-CM | POA: Insufficient documentation

## 2016-12-21 DIAGNOSIS — Z95828 Presence of other vascular implants and grafts: Secondary | ICD-10-CM

## 2016-12-21 DIAGNOSIS — Z79811 Long term (current) use of aromatase inhibitors: Secondary | ICD-10-CM | POA: Diagnosis not present

## 2016-12-21 DIAGNOSIS — I972 Postmastectomy lymphedema syndrome: Secondary | ICD-10-CM | POA: Insufficient documentation

## 2016-12-21 DIAGNOSIS — Z7982 Long term (current) use of aspirin: Secondary | ICD-10-CM | POA: Diagnosis not present

## 2016-12-21 DIAGNOSIS — I10 Essential (primary) hypertension: Secondary | ICD-10-CM | POA: Diagnosis not present

## 2016-12-21 DIAGNOSIS — Z9012 Acquired absence of left breast and nipple: Secondary | ICD-10-CM | POA: Diagnosis not present

## 2016-12-21 MED ORDER — HEPARIN SOD (PORK) LOCK FLUSH 100 UNIT/ML IV SOLN
500.0000 [IU] | INTRAVENOUS | Status: AC | PRN
  Administered 2016-12-21: 500 [IU]

## 2016-12-21 MED ORDER — SODIUM CHLORIDE 0.9% FLUSH
10.0000 mL | INTRAVENOUS | Status: AC | PRN
  Administered 2016-12-21: 10 mL
  Filled 2016-12-21: qty 10

## 2017-01-26 ENCOUNTER — Telehealth: Payer: Self-pay | Admitting: *Deleted

## 2017-01-26 NOTE — Telephone Encounter (Signed)
Patient called requesting a return call from [facility]. [phone]

## 2017-02-20 ENCOUNTER — Encounter: Payer: Self-pay | Admitting: General Surgery

## 2017-02-20 ENCOUNTER — Ambulatory Visit: Payer: Medicare HMO | Admitting: General Surgery

## 2017-02-20 VITALS — BP 142/72 | HR 68 | Resp 12 | Ht 60.0 in | Wt 134.0 lb

## 2017-02-20 DIAGNOSIS — C50912 Malignant neoplasm of unspecified site of left female breast: Secondary | ICD-10-CM | POA: Diagnosis not present

## 2017-02-20 DIAGNOSIS — I890 Lymphedema, not elsewhere classified: Secondary | ICD-10-CM | POA: Diagnosis not present

## 2017-02-20 NOTE — Patient Instructions (Addendum)
The patient is aware to call back for any questions or concerns. Patient to return in one year for her breast cancer check up Encouraged to wear her lymphedema sleeve

## 2017-02-20 NOTE — Progress Notes (Signed)
Patient ID: Andrea Rodgers, female   DOB: Feb 15, 1940, 77 y.o.   MRN: [id number]  Chief Complaint  Patient presents with  . Follow-up    HPI Andrea Rodgers is a 77 y.o. female.  who presents for her breast cancer follow up and a breast evaluation. The most recent mammogram was done on 10-18-16.  Patient does perform regular self breast checks and gets regular mammograms done.   No new breast issues. Tolerating anastrozole. She does use a pressure pump, she does not wear the sleeve.  HPI  Past Medical History:  Diagnosis Date  . Breast cancer ([place]) 2015   left breast cancer  . 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 2 cm axillary node. One of 11 nodes positive on post-adjuvant chemotherapy axillary dissection. 3+ centimeter tumor.  . Hyperlipemia   . Hyperlipidemia   . Hypertension   . 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 Murmur   . Osteoporosis     Past Surgical History:  Procedure Laterality Date  . ABDOMINAL HYSTERECTOMY    . APPENDECTOMY    . BREAST SURGERY Left 02/25/14   Modified radical mastectomy, T2 N1. Grade 3, ER, PR negative, HER-2/neu 3+.  . COLONOSCOPY WITH PROPOFOL N/A 01/26/2016   Procedure: COLONOSCOPY WITH PROPOFOL;  Surgeon: [name], MD;  Location: [facility] ENDOSCOPY;  Service: Endoscopy;  Laterality: N/A;  . MASTECTOMY Left 02-25-14   Dr Bary Castilla    Family History  Problem Relation Age of Onset  . Hypertension Mother   . Hypertension Maternal Aunt   . Hypertension Maternal Uncle   . Hypertension Maternal Aunt   . Hypertension Maternal Aunt   . Breast cancer Neg Hx     Social History Social History   Tobacco Use  . Smoking status: Never Smoker  . Smokeless tobacco: Never Used  Substance Use Topics  . Alcohol use: No  . Drug use: No    No Known Allergies  Current Outpatient  Medications  Medication Sig Dispense Refill  . alendronate (FOSAMAX) 70 MG tablet Take 70 mg by mouth once a week. Take with a full glass of water on an empty stomach.    Marland Kitchen amLODipine (NORVASC) 2.5 MG tablet Take 2.5 mg by mouth daily.    Marland Kitchen anastrozole (ARIMIDEX) 1 MG tablet Take 1 tablet (1 mg total) by mouth daily. 90 tablet 4  . aspirin 81 MG tablet Take 81 mg by mouth daily.    . cholecalciferol (VITAMIN D) 1000 UNITS tablet Take 1,000 Units by mouth daily.    Marland Kitchen losartan-hydrochlorothiazide (HYZAAR) 100-25 MG per tablet Take 1 tablet by mouth daily.    . Multiple Vitamins-Minerals (MULTIVITAMIN WITH MINERALS) tablet Take 1 tablet by mouth daily.    . simvastatin (ZOCOR) 40 MG tablet Take 40 mg by mouth at bedtime.     No current facility-administered medications for this visit.    Facility-Administered Medications Ordered in Other Visits  Medication Dose Route Frequency Provider Last Rate Last Dose  . sodium chloride flush (NS) 0.9 % injection 10 mL  10 mL Intravenous PRN Cammie Sickle, MD   10 mL at 10/19/16 1054    Review of Systems Review of Systems  Constitutional: Negative.   Respiratory: Negative.   Cardiovascular: Negative.     Blood pressure (!) 142/72, pulse 68, resp. rate 12, height  5' (1.524 m), weight 134 lb (60.8 kg).  Physical Exam Physical Exam  Constitutional: She is oriented to person, place, and time. She appears well-developed and well-nourished.  HENT:  Mouth/Throat: Oropharynx is clear and moist. No oropharyngeal exudate.  Eyes: Conjunctivae are normal. No scleral icterus.  Neck: Neck supple.  Cardiovascular: Normal rate, regular rhythm and normal heart sounds.  Pulmonary/Chest: Effort normal and breath sounds normal. Right breast exhibits no inverted nipple, no mass, no nipple discharge, no skin change and no tenderness.    Left arm edema noted. Left mastectomy site well healed.  Lymphadenopathy:    She has no cervical adenopathy.    She has  no axillary adenopathy.  Neurological: She is alert and oriented to person, place, and time.  Skin: Skin is warm and dry.  Psychiatric: Her behavior is normal.  Measurement of the upper extremity was completed at a location 15 cm above as well as 10 and 20 cm below the olecranon process.  Right: 29, 25, 17 cm.  (January 2018:: 29, 25, 17 cm)  Left: 32, 27.5, 19.5 cm.  (January 2018: 33, 29, 20 cm)  Data Reviewed December 17, 2016 right screening mammogram reviewed.  BI-RADS-1.  Medical oncology notes from October 19, 2016 reviewed.  Most recent CEA was of that date, 38.6.  Improved from a value of 44.5 in January 2018.  Assessment    No evidence of local/regional disease.  Left upper extremity lymphedema, modest.  Plan    The patient reports that her left breast prosthesis is working well.    Encouraged to wear her lymphedema sleeve during the day and to continue use her pneumatic pump at night.  Patient to return in one year for her breast cancer check up. Dr. Rogue Bussing is ordering her mammogram.     HPI, Physical Exam, Assessment and Plan have been scribed under the direction and in the presence of [name], MD. Karie Fetch, RN  I have completed the exam and reviewed the above documentation for accuracy and completeness.  I agree with the above.  Haematologist has been used and any errors in dictation or transcription are unintentional.  Hervey Ard, M.D., F.A.C.S. [name] 02/20/2017, 2:00 PM

## 2017-02-22 ENCOUNTER — Inpatient Hospital Stay: Payer: Medicare HMO | Attending: Internal Medicine

## 2017-02-22 DIAGNOSIS — E785 Hyperlipidemia, unspecified: Secondary | ICD-10-CM | POA: Diagnosis not present

## 2017-02-22 DIAGNOSIS — C773 Secondary and unspecified malignant neoplasm of axilla and upper limb lymph nodes: Secondary | ICD-10-CM | POA: Insufficient documentation

## 2017-02-22 DIAGNOSIS — Z79899 Other long term (current) drug therapy: Secondary | ICD-10-CM | POA: Insufficient documentation

## 2017-02-22 DIAGNOSIS — Z171 Estrogen receptor negative status [ER-]: Secondary | ICD-10-CM | POA: Diagnosis not present

## 2017-02-22 DIAGNOSIS — Z9012 Acquired absence of left breast and nipple: Secondary | ICD-10-CM | POA: Insufficient documentation

## 2017-02-22 DIAGNOSIS — Z9221 Personal history of antineoplastic chemotherapy: Secondary | ICD-10-CM | POA: Diagnosis not present

## 2017-02-22 DIAGNOSIS — Z79811 Long term (current) use of aromatase inhibitors: Secondary | ICD-10-CM | POA: Insufficient documentation

## 2017-02-22 DIAGNOSIS — Z7982 Long term (current) use of aspirin: Secondary | ICD-10-CM | POA: Diagnosis not present

## 2017-02-22 DIAGNOSIS — I972 Postmastectomy lymphedema syndrome: Secondary | ICD-10-CM | POA: Insufficient documentation

## 2017-02-22 DIAGNOSIS — C50812 Malignant neoplasm of overlapping sites of left female breast: Secondary | ICD-10-CM | POA: Diagnosis not present

## 2017-02-22 DIAGNOSIS — I10 Essential (primary) hypertension: Secondary | ICD-10-CM | POA: Diagnosis not present

## 2017-02-22 DIAGNOSIS — Z452 Encounter for adjustment and management of vascular access device: Secondary | ICD-10-CM | POA: Insufficient documentation

## 2017-02-22 DIAGNOSIS — M810 Age-related osteoporosis without current pathological fracture: Secondary | ICD-10-CM | POA: Insufficient documentation

## 2017-02-22 DIAGNOSIS — Z95828 Presence of other vascular implants and grafts: Secondary | ICD-10-CM

## 2017-02-22 MED ORDER — SODIUM CHLORIDE 0.9% FLUSH
10.0000 mL | INTRAVENOUS | Status: DC | PRN
  Administered 2017-02-22: 10 mL via INTRAVENOUS
  Filled 2017-02-22: qty 10

## 2017-02-22 MED ORDER — HEPARIN SOD (PORK) LOCK FLUSH 100 UNIT/ML IV SOLN
500.0000 [IU] | Freq: Once | INTRAVENOUS | Status: AC
  Administered 2017-02-22: 500 [IU] via INTRAVENOUS

## 2017-04-18 ENCOUNTER — Other Ambulatory Visit: Payer: Self-pay | Admitting: *Deleted

## 2017-04-18 DIAGNOSIS — C50812 Malignant neoplasm of overlapping sites of left female breast: Secondary | ICD-10-CM

## 2017-04-19 ENCOUNTER — Inpatient Hospital Stay: Payer: Medicare HMO | Attending: Internal Medicine

## 2017-04-19 ENCOUNTER — Encounter: Payer: Self-pay | Admitting: Internal Medicine

## 2017-04-19 ENCOUNTER — Inpatient Hospital Stay (HOSPITAL_BASED_OUTPATIENT_CLINIC_OR_DEPARTMENT_OTHER): Payer: Medicare HMO | Admitting: Internal Medicine

## 2017-04-19 DIAGNOSIS — Z79811 Long term (current) use of aromatase inhibitors: Secondary | ICD-10-CM | POA: Diagnosis not present

## 2017-04-19 DIAGNOSIS — Z79899 Other long term (current) drug therapy: Secondary | ICD-10-CM

## 2017-04-19 DIAGNOSIS — I10 Essential (primary) hypertension: Secondary | ICD-10-CM | POA: Diagnosis not present

## 2017-04-19 DIAGNOSIS — C50812 Malignant neoplasm of overlapping sites of left female breast: Secondary | ICD-10-CM

## 2017-04-19 DIAGNOSIS — Z8572 Personal history of non-Hodgkin lymphomas: Secondary | ICD-10-CM | POA: Diagnosis not present

## 2017-04-19 DIAGNOSIS — M8580 Other specified disorders of bone density and structure, unspecified site: Secondary | ICD-10-CM | POA: Insufficient documentation

## 2017-04-19 DIAGNOSIS — Z171 Estrogen receptor negative status [ER-]: Secondary | ICD-10-CM

## 2017-04-19 DIAGNOSIS — E785 Hyperlipidemia, unspecified: Secondary | ICD-10-CM | POA: Insufficient documentation

## 2017-04-19 DIAGNOSIS — Z95828 Presence of other vascular implants and grafts: Secondary | ICD-10-CM

## 2017-04-19 DIAGNOSIS — Z7982 Long term (current) use of aspirin: Secondary | ICD-10-CM | POA: Insufficient documentation

## 2017-04-19 LAB — COMPREHENSIVE METABOLIC PANEL
ALBUMIN: 3.9 g/dL (ref 3.5–5.0)
ALT: 15 U/L (ref 14–54)
AST: 33 U/L (ref 15–41)
Alkaline Phosphatase: 60 U/L (ref 38–126)
Anion gap: 11 (ref 5–15)
BUN: 12 mg/dL (ref 6–20)
CHLORIDE: 100 mmol/L — AB (ref 101–111)
CO2: 25 mmol/L (ref 22–32)
Calcium: 8.7 mg/dL — ABNORMAL LOW (ref 8.9–10.3)
Creatinine, Ser: 0.54 mg/dL (ref 0.44–1.00)
GFR calc Af Amer: >60 mL/min (ref >60)
GFR calc non Af Amer: >60 mL/min (ref >60)
GLUCOSE: 130 mg/dL — AB (ref 65–99)
POTASSIUM: 3.2 mmol/L — AB (ref 3.5–5.1)
SODIUM: 136 mmol/L (ref 135–145)
Total Bilirubin: 0.8 mg/dL (ref 0.3–1.2)
Total Protein: 7.4 g/dL (ref 6.5–8.1)

## 2017-04-19 LAB — CBC WITH DIFFERENTIAL/PLATELET
Basophils Absolute: 0 10*3/uL (ref 0–0.1)
Basophils Relative: 1 %
EOS PCT: 1 %
Eosinophils Absolute: 0 10*3/uL (ref 0–0.7)
HCT: 34.5 % — ABNORMAL LOW (ref 35.0–47.0)
Hemoglobin: 11.8 g/dL — ABNORMAL LOW (ref 12.0–16.0)
LYMPHS ABS: 0.6 10*3/uL — AB (ref 1.0–3.6)
LYMPHS PCT: 8 %
MCH: 31 pg (ref 26.0–34.0)
MCHC: 34.1 g/dL (ref 32.0–36.0)
MCV: 90.9 fL (ref 80.0–100.0)
MONO ABS: 1.3 10*3/uL — AB (ref 0.2–0.9)
Monocytes Relative: 19 %
Neutro Abs: 5.2 10*3/uL (ref 1.4–6.5)
Neutrophils Relative %: 73 %
PLATELETS: 202 10*3/uL (ref 150–440)
RBC: 3.79 MIL/uL — ABNORMAL LOW (ref 3.80–5.20)
RDW: 13.6 % (ref 11.5–14.5)
WBC: 7.2 10*3/uL (ref 3.6–11.0)

## 2017-04-19 MED ORDER — HEPARIN SOD (PORK) LOCK FLUSH 100 UNIT/ML IV SOLN
500.0000 [IU] | INTRAVENOUS | Status: AC | PRN
  Administered 2017-04-19: 500 [IU]

## 2017-04-19 MED ORDER — SODIUM CHLORIDE 0.9% FLUSH
10.0000 mL | INTRAVENOUS | Status: AC | PRN
  Administered 2017-04-19: 10 mL
  Filled 2017-04-19: qty 10

## 2017-04-19 NOTE — Progress Notes (Signed)
[place] OFFICE PROGRESS NOTE  Patient Care Team: Albina Billet, MD as PCP - General (Internal Medicine) Bary Castilla Forest Gleason, MD as Consulting Physician (General Surgery) Albina Billet, MD (Internal Medicine)   SUMMARY OF ONCOLOGIC HISTORY:  Oncology History   # AUG 2015- LEFT BREAST  STAGE IIB [pT2pN1a]ER-NEG; PR- 1-10%; Her 2 NEU POS; Started neo-adj chemo- AC x4 [lung-MALT lymphoma]; Min response Breast tumor- Lumpec & ALND- T= 3.2cm; N= 1/11LN. 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  No nausea no vomiting. No headaches. No new lumps or bumps. Intermittent hot flashes not any worse. She denies any unusual body aches and muscle aches joint pains.   REVIEW OF SYSTEMS:  A complete 10 point review of system is done which is negative except mentioned above/history of present illness.   PAST MEDICAL HISTORY :  Past Medical History:  Diagnosis Date  . Breast cancer ([place]) 2015   left breast cancer  . 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 2 cm axillary node. One of 11 nodes positive on post-adjuvant chemotherapy axillary dissection.  3+ centimeter tumor.  . Hyperlipemia   . Hyperlipidemia   . Hypertension   . 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 Murmur   . Osteoporosis     PAST SURGICAL HISTORY :   Past Surgical History:  Procedure Laterality Date  . ABDOMINAL HYSTERECTOMY    . APPENDECTOMY    . BREAST SURGERY Left 02/25/14   Modified radical mastectomy, T2 N1. Grade 3, ER, PR negative, HER-2/neu 3+.  . COLONOSCOPY WITH PROPOFOL N/A 01/26/2016   Procedure: COLONOSCOPY WITH PROPOFOL;  Surgeon: [name], MD;  Location: [facility] ENDOSCOPY;  Service: Endoscopy;  Laterality: N/A;  . MASTECTOMY Left 02-25-14   Dr Bary Castilla    FAMILY HISTORY :   Family History  Problem Relation Age of Onset  . Hypertension Mother   . Hypertension Maternal Aunt   . Hypertension Maternal Uncle   . Hypertension Maternal Aunt   . Hypertension Maternal Aunt   . Breast cancer Neg Hx     SOCIAL HISTORY:   Social History   Tobacco Use  . Smoking status: Never Smoker  . Smokeless tobacco: Never Used  Substance Use Topics  . Alcohol use: No  . Drug use: No    ALLERGIES:  has No Known Allergies.  MEDICATIONS:  Current Outpatient Medications  Medication Sig Dispense Refill  . alendronate (FOSAMAX) 70 MG tablet Take 70 mg by mouth once a week. Take with  a full glass of water on an empty stomach.    Marland Kitchen amLODipine (NORVASC) 2.5 MG tablet Take 2.5 mg by mouth daily.    Marland Kitchen anastrozole (ARIMIDEX) 1 MG tablet Take 1 tablet (1 mg total) by mouth daily. 90 tablet 4  . aspirin 81 MG tablet Take 81 mg by mouth daily.    . cholecalciferol (VITAMIN D) 1000 UNITS tablet Take 1,000 Units by mouth daily.    Marland Kitchen losartan-hydrochlorothiazide (HYZAAR) 100-25 MG per tablet Take 1 tablet by mouth daily.    . Multiple Vitamins-Minerals (MULTIVITAMIN WITH MINERALS) tablet Take 1 tablet by mouth daily.    . simvastatin (ZOCOR) 40 MG tablet Take 40 mg by mouth at  bedtime.     No current facility-administered medications for this visit.    Facility-Administered Medications Ordered in Other Visits  Medication Dose Route Frequency Provider Last Rate Last Dose  . sodium chloride flush (NS) 0.9 % injection 10 mL  10 mL Intravenous PRN Cammie Sickle, MD   10 mL at 10/19/16 1054    PHYSICAL EXAMINATION: ECOG PERFORMANCE STATUS: 0 - Asymptomatic  BP (!) 141/77 (BP Location: Left Arm, Patient Position: Sitting)   Pulse 69   Temp 99.8 F (37.7 C) (Tympanic)   Resp 16   Wt 132 lb 12.8 oz (60.2 kg)   BMI 25.94 kg/m   Filed Weights   04/19/17 1106  Weight: 132 lb 12.8 oz (60.2 kg)    GENERAL: Well-nourished well-developed; Alert, no distress and comfortable.She is alone.  EYES: no pallor or icterus.  OROPHARYNX: no thrush or ulceration; poor dentition.no masses felt LYMPH:  no palpable lymphadenopathy in the cervical, axillary or inguinal regions LUNGS: clear to auscultation and  No wheeze or crackles HEART/CVS: regular rate & rhythm and no murmurs; No lower extremity edema; lymphedema noted in the left upper extremity/she is wearing a sleeve.  ABDOMEN:abdomen soft, non-tender and normal bowel sounds Musculoskeletal:no cyanosis of digits and no clubbing  PSYCH: alert & oriented x 3 with fluent speech NEURO: no focal motor/sensory deficits SKIN:  no rashes or significant lesions  LABORATORY DATA:  I have reviewed the data as listed    Component Value Date/Time   NA 136 04/19/2017 1043   NA 136 05/27/2014 0942   K 3.2 (L) 04/19/2017 1043   K 3.4 (L) 05/27/2014 0942   CL 100 (L) 04/19/2017 1043   CL 102 05/27/2014 0942   CO2 25 04/19/2017 1043   CO2 29 05/27/2014 0942   GLUCOSE 130 (H) 04/19/2017 1043   GLUCOSE 106 (H) 05/27/2014 0942   BUN 12 04/19/2017 1043   BUN 13 05/27/2014 0942   CREATININE 0.54 04/19/2017 1043   CREATININE 0.58 05/27/2014 0942   CALCIUM 8.7 (L) 04/19/2017 1043   CALCIUM 8.6 (L) 05/27/2014 0942   PROT  7.4 04/19/2017 1043   PROT 6.8 05/27/2014 0942   ALBUMIN 3.9 04/19/2017 1043   ALBUMIN 3.8 05/27/2014 0942   AST 33 04/19/2017 1043   AST 18 05/27/2014 0942   ALT 15 04/19/2017 1043   ALT 14 05/27/2014 0942   ALKPHOS 60 04/19/2017 1043   ALKPHOS 69 05/27/2014 0942   BILITOT 0.8 04/19/2017 1043   BILITOT 0.6 05/27/2014 0942   GFRNONAA >60 04/19/2017 1043   GFRNONAA >60 05/27/2014 0942   GFRAA >60 04/19/2017 1043   GFRAA >60 05/27/2014 0942    No results found for: SPEP, UPEP  Lab Results  Component Value Date   WBC 7.2 04/19/2017   NEUTROABS 5.2  04/19/2017   HGB 11.8 (L) 04/19/2017   HCT 34.5 (L) 04/19/2017   MCV 90.9 04/19/2017   PLT 202 04/19/2017      Chemistry      Component Value Date/Time   NA 136 04/19/2017 1043   NA 136 05/27/2014 0942   K 3.2 (L) 04/19/2017 1043   K 3.4 (L) 05/27/2014 0942   CL 100 (L) 04/19/2017 1043   CL 102 05/27/2014 0942   CO2 25 04/19/2017 1043   CO2 29 05/27/2014 0942   BUN 12 04/19/2017 1043   BUN 13 05/27/2014 0942   CREATININE 0.54 04/19/2017 1043   CREATININE 0.58 05/27/2014 0942      Component Value Date/Time   CALCIUM 8.7 (L) 04/19/2017 1043   CALCIUM 8.6 (L) 05/27/2014 0942   ALKPHOS 60 04/19/2017 1043   ALKPHOS 69 05/27/2014 0942   AST 33 04/19/2017 1043   AST 18 05/27/2014 0942   ALT 15 04/19/2017 1043   ALT 14 05/27/2014 0942   BILITOT 0.8 04/19/2017 1043   BILITOT 0.6 05/27/2014 0942     Results for Bucio, SHELLYANN WANDREY (MRN [id number]) as of 04/19/2017 11:24  Ref. Range 03/10/2015 13:05 06/21/2015 10:12 09/23/2015 10:12 02/21/2016 11:35 10/19/2016 10:36  CA 27.29 Latest Ref Range: 0.0 - 38.6 U/mL 33.2 35.7 35.3 44.5 (H)   CA 27.29 Latest Ref Range: 0.0 - 38.6 U/mL     38.6  CEA Latest Ref Range: 0.0 - 4.7 ng/mL  2.0        ASSESSMENT & PLAN:   Carcinoma of overlapping sites of left breast in female, estrogen receptor negative ([place]) # Stage II ER negative PR weak HER-2/neu positive breast cancer s/p  adjuvant/maintenance  Herceptin [finished Feb 2017]. Clinically no evidence of recurrence. Continue arimidex for now.   # Elevated tumor marker CA 27-29-  June 2018- Normal. Awaiting from today.   # History of MALT lymphoma of the lung- clinically no evidence of progression noted.   # Osteoporosis surveillance-she is on fosomax/ vit D;  bone density May 2017 osteopenia.  Will recommend BMD in 6 months [as per Dr.Tate]  # port flush every 2 months-   # follow up in 6 months/ labs- ca 27-29/port flush.      Cammie Sickle, MD 05/01/2017 3:38 PM

## 2017-04-19 NOTE — Assessment & Plan Note (Addendum)
#  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 Clinically no evidence of recurrence. Continue arimidex for now.   # Elevated tumor marker CA 27-29-  June 2018- Normal. Awaiting from today.   # History of MALT lymphoma of the lung- clinically no evidence of progression noted.   # Osteoporosis surveillance-she is on fosomax/ vit D;  bone density May 2017 osteopenia.  Will recommend BMD in 6 months [as per Dr.Tate]  # port flush every 2 months.    # follow up in 6 months/ labs- ca 27-29/port flush.

## 2017-04-20 LAB — CANCER ANTIGEN 27.29: CA 27.29: 31.3 U/mL (ref 0.0–38.6)

## 2017-04-25 ENCOUNTER — Other Ambulatory Visit: Payer: Self-pay | Admitting: Internal Medicine

## 2017-04-25 DIAGNOSIS — Z853 Personal history of malignant neoplasm of breast: Secondary | ICD-10-CM

## 2017-04-25 DIAGNOSIS — M8580 Other specified disorders of bone density and structure, unspecified site: Secondary | ICD-10-CM

## 2017-05-21 ENCOUNTER — Ambulatory Visit
Admission: RE | Admit: 2017-05-21 | Discharge: 2017-05-21 | Disposition: A | Discharge status: Home / Self Care | Payer: Medicare HMO | Source: Ambulatory Visit | Attending: Internal Medicine | Admitting: Internal Medicine

## 2017-05-21 DIAGNOSIS — Z853 Personal history of malignant neoplasm of breast: Secondary | ICD-10-CM | POA: Diagnosis present

## 2017-05-21 DIAGNOSIS — M8580 Other specified disorders of bone density and structure, unspecified site: Secondary | ICD-10-CM

## 2017-05-21 DIAGNOSIS — M8588 Other specified disorders of bone density and structure, other site: Secondary | ICD-10-CM | POA: Diagnosis not present

## 2017-05-23 ENCOUNTER — Other Ambulatory Visit: Payer: Medicare HMO

## 2017-06-11 ENCOUNTER — Other Ambulatory Visit: Payer: Self-pay | Admitting: Internal Medicine

## 2017-06-11 DIAGNOSIS — C50912 Malignant neoplasm of unspecified site of left female breast: Secondary | ICD-10-CM

## 2017-06-21 ENCOUNTER — Inpatient Hospital Stay: Payer: Medicare HMO | Attending: Internal Medicine

## 2017-06-21 DIAGNOSIS — Z79811 Long term (current) use of aromatase inhibitors: Secondary | ICD-10-CM | POA: Insufficient documentation

## 2017-06-21 DIAGNOSIS — Z7982 Long term (current) use of aspirin: Secondary | ICD-10-CM | POA: Diagnosis not present

## 2017-06-21 DIAGNOSIS — Z171 Estrogen receptor negative status [ER-]: Secondary | ICD-10-CM | POA: Insufficient documentation

## 2017-06-21 DIAGNOSIS — Z452 Encounter for adjustment and management of vascular access device: Secondary | ICD-10-CM | POA: Insufficient documentation

## 2017-06-21 DIAGNOSIS — Z79899 Other long term (current) drug therapy: Secondary | ICD-10-CM | POA: Insufficient documentation

## 2017-06-21 DIAGNOSIS — M8580 Other specified disorders of bone density and structure, unspecified site: Secondary | ICD-10-CM | POA: Insufficient documentation

## 2017-06-21 DIAGNOSIS — I10 Essential (primary) hypertension: Secondary | ICD-10-CM | POA: Insufficient documentation

## 2017-06-21 DIAGNOSIS — C50812 Malignant neoplasm of overlapping sites of left female breast: Secondary | ICD-10-CM | POA: Diagnosis not present

## 2017-06-21 DIAGNOSIS — Z95828 Presence of other vascular implants and grafts: Secondary | ICD-10-CM

## 2017-06-21 DIAGNOSIS — E785 Hyperlipidemia, unspecified: Secondary | ICD-10-CM | POA: Insufficient documentation

## 2017-06-21 DIAGNOSIS — Z8572 Personal history of non-Hodgkin lymphomas: Secondary | ICD-10-CM | POA: Diagnosis not present

## 2017-06-21 MED ORDER — HEPARIN SOD (PORK) LOCK FLUSH 100 UNIT/ML IV SOLN
500.0000 [IU] | Freq: Once | INTRAVENOUS | Status: AC
  Administered 2017-06-21: 500 [IU] via INTRAVENOUS

## 2017-06-21 MED ORDER — SODIUM CHLORIDE 0.9% FLUSH
10.0000 mL | INTRAVENOUS | Status: DC | PRN
  Administered 2017-06-21: 10 mL via INTRAVENOUS
  Filled 2017-06-21: qty 10

## 2017-08-23 ENCOUNTER — Inpatient Hospital Stay: Payer: Medicare HMO | Attending: Internal Medicine

## 2017-08-27 ENCOUNTER — Telehealth: Payer: Self-pay | Admitting: *Deleted

## 2017-08-27 ENCOUNTER — Other Ambulatory Visit: Payer: Self-pay | Admitting: *Deleted

## 2017-08-27 DIAGNOSIS — C50912 Malignant neoplasm of unspecified site of left female breast: Secondary | ICD-10-CM

## 2017-08-27 NOTE — Telephone Encounter (Signed)
Patient called to ask Education officer, museum with assistance for food money.

## 2017-08-27 NOTE — Telephone Encounter (Signed)
Patient requesSt refill for ARIMIDEX.

## 2017-08-27 NOTE — Telephone Encounter (Signed)
Called patient to remind her to call her pharmacy to make sure she had no refills. She reports that she already has called and there is no refill.

## 2017-08-28 MED ORDER — ANASTROZOLE 1 MG PO TABS: 1.0000 mg | ORAL_TABLET | Freq: Every day | ORAL | 4 refills | Status: DC

## 2017-10-22 ENCOUNTER — Ambulatory Visit
Admission: RE | Admit: 2017-10-22 | Discharge: 2017-10-22 | Disposition: A | Discharge status: Home / Self Care | Payer: Medicare HMO | Source: Ambulatory Visit | Attending: Internal Medicine | Admitting: Internal Medicine

## 2017-10-22 DIAGNOSIS — Z1231 Encounter for screening mammogram for malignant neoplasm of breast: Secondary | ICD-10-CM | POA: Diagnosis present

## 2017-10-25 ENCOUNTER — Inpatient Hospital Stay (HOSPITAL_BASED_OUTPATIENT_CLINIC_OR_DEPARTMENT_OTHER): Payer: Medicare HMO | Admitting: Internal Medicine

## 2017-10-25 ENCOUNTER — Other Ambulatory Visit: Payer: Self-pay | Admitting: *Deleted

## 2017-10-25 ENCOUNTER — Encounter: Payer: Self-pay | Admitting: Internal Medicine

## 2017-10-25 ENCOUNTER — Inpatient Hospital Stay: Payer: Medicare HMO | Attending: Internal Medicine

## 2017-10-25 ENCOUNTER — Other Ambulatory Visit: Payer: Self-pay

## 2017-10-25 VITALS — BP 166/68 | HR 91 | Temp 97.6°F | Resp 18 | Ht 60.0 in | Wt 127.0 lb

## 2017-10-25 DIAGNOSIS — C50812 Malignant neoplasm of overlapping sites of left female breast: Secondary | ICD-10-CM | POA: Diagnosis not present

## 2017-10-25 DIAGNOSIS — Z171 Estrogen receptor negative status [ER-]: Secondary | ICD-10-CM | POA: Insufficient documentation

## 2017-10-25 DIAGNOSIS — Z79811 Long term (current) use of aromatase inhibitors: Secondary | ICD-10-CM

## 2017-10-25 DIAGNOSIS — Z9221 Personal history of antineoplastic chemotherapy: Secondary | ICD-10-CM

## 2017-10-25 DIAGNOSIS — M810 Age-related osteoporosis without current pathological fracture: Secondary | ICD-10-CM

## 2017-10-25 DIAGNOSIS — E876 Hypokalemia: Secondary | ICD-10-CM | POA: Diagnosis not present

## 2017-10-25 DIAGNOSIS — C884 Extranodal marginal zone B-cell lymphoma of mucosa-associated lymphoid tissue [MALT-lymphoma]: Secondary | ICD-10-CM | POA: Diagnosis not present

## 2017-10-25 DIAGNOSIS — Z95828 Presence of other vascular implants and grafts: Secondary | ICD-10-CM

## 2017-10-25 LAB — CBC WITH DIFFERENTIAL/PLATELET
BASOS PCT: 0 %
Basophils Absolute: 0 10*3/uL (ref 0–0.1)
EOS ABS: 0 10*3/uL (ref 0–0.7)
Eosinophils Relative: 0 %
HCT: 34.7 % — ABNORMAL LOW (ref 35.0–47.0)
Hemoglobin: 11.8 g/dL — ABNORMAL LOW (ref 12.0–16.0)
Lymphocytes Relative: 23 %
Lymphs Abs: 1.1 10*3/uL (ref 1.0–3.6)
MCH: 31.4 pg (ref 26.0–34.0)
MCHC: 34.1 g/dL (ref 32.0–36.0)
MCV: 92.1 fL (ref 80.0–100.0)
MONO ABS: 0.7 10*3/uL (ref 0.2–0.9)
MONOS PCT: 16 %
Neutro Abs: 2.8 10*3/uL (ref 1.4–6.5)
Neutrophils Relative %: 61 %
Platelets: 230 10*3/uL (ref 150–440)
RBC: 3.77 MIL/uL — ABNORMAL LOW (ref 3.80–5.20)
RDW: 13 % (ref 11.5–14.5)
WBC: 4.6 10*3/uL (ref 3.6–11.0)

## 2017-10-25 LAB — COMPREHENSIVE METABOLIC PANEL
ALBUMIN: 4.2 g/dL (ref 3.5–5.0)
ALK PHOS: 58 U/L (ref 38–126)
ALT: 18 U/L (ref 0–44)
ANION GAP: 9 (ref 5–15)
AST: 28 U/L (ref 15–41)
BILIRUBIN TOTAL: 1 mg/dL (ref 0.3–1.2)
BUN: 12 mg/dL (ref 8–23)
CALCIUM: 9.2 mg/dL (ref 8.9–10.3)
CO2: 29 mmol/L (ref 22–32)
Chloride: 102 mmol/L (ref 98–111)
Creatinine, Ser: 0.61 mg/dL (ref 0.44–1.00)
GFR calc non Af Amer: >60 mL/min (ref >60)
GLUCOSE: 114 mg/dL — AB (ref 70–99)
POTASSIUM: 2.9 mmol/L — AB (ref 3.5–5.1)
SODIUM: 140 mmol/L (ref 135–145)
TOTAL PROTEIN: 7.7 g/dL (ref 6.5–8.1)

## 2017-10-25 MED ORDER — POTASSIUM CHLORIDE CRYS ER 20 MEQ PO TBCR: EXTENDED_RELEASE_TABLET | ORAL | 0 refills | Status: DC

## 2017-10-25 MED ORDER — SODIUM CHLORIDE 0.9% FLUSH
10.0000 mL | Freq: Once | INTRAVENOUS | Status: AC
  Administered 2017-10-25: 10 mL via INTRAVENOUS
  Filled 2017-10-25: qty 10

## 2017-10-25 MED ORDER — HEPARIN SOD (PORK) LOCK FLUSH 100 UNIT/ML IV SOLN
500.0000 [IU] | Freq: Once | INTRAVENOUS | Status: AC
  Administered 2017-10-25: 500 [IU] via INTRAVENOUS

## 2017-10-25 NOTE — Assessment & Plan Note (Addendum)
#  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   # Clinically no evidence of recurrence. Continue arimidex for now.  Stable.   # Elevated tumor marker CA 27-29-Jan 2019 normal. Awaiting from today.  Stable.  # severe hypokalemia- K- 2.9; Kdur bid x 1week; and then 1 a day. # 60; and recheck labs with Dr.Tate.   # History of MALT lymphoma of the lung- clinically no evidence of progression noted.   # Osteoporosis surveillance-she is on fosomax/ vit D;  bone density May 2017 osteopenia.  BMD- April 2019- =-1.8; STABLE.  # port flush every 2 months.    # follow up in 6 months/ labs- ca 27-29/port flush.   Cc;Dr.Tate.

## 2017-10-25 NOTE — Progress Notes (Signed)
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  Patient denies any nausea vomiting.  Denies any new lumps or bumps.  Daughter is concerned about weight loss.Denies any joint pains bone pain.   Review of Systems  Constitutional: Positive for weight loss. Negative for chills, diaphoresis, fever and malaise/fatigue.  HENT: Negative for nosebleeds and sore throat.   Eyes: Negative for double vision.  Respiratory: Negative for cough, hemoptysis, sputum production, shortness of breath and wheezing.   Cardiovascular: Negative  for chest pain, palpitations, orthopnea and leg swelling.  Gastrointestinal: Negative for abdominal pain, blood in stool, constipation, diarrhea, heartburn, melena, nausea and vomiting.  Genitourinary: Negative for dysuria, frequency and urgency.  Musculoskeletal: Negative for back pain and joint pain.  Skin: Negative.  Negative for itching and rash.  Neurological: Negative for dizziness, tingling, focal weakness, weakness and headaches.  Endo/Heme/Allergies: Does not bruise/bleed easily.  Psychiatric/Behavioral: Negative for depression. The patient is not nervous/anxious and does not have insomnia.      PAST MEDICAL HISTORY :  Past Medical History:  Diagnosis Date  . Breast cancer ([place]) 2015   left breast cancer  . Breast cancer metastasized to axillary lymph node ([place]) August 2015   T2, N1, ER negative, PR negative, HER-2 amplified. 2 cm axillary node. One of 11 nodes positive on post-adjuvant chemotherapy axillary dissection. 3+ centimeter tumor.  . Hyperlipemia   . Hyperlipidemia   . Hypertension   . 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  Marland Kitchen MALT lymphoma ([place])   . Murmur   . Osteoporosis     PAST SURGICAL HISTORY :   Past Surgical History:  Procedure Laterality Date  . ABDOMINAL HYSTERECTOMY    . APPENDECTOMY    . BREAST SURGERY Left 02/25/14   Modified radical mastectomy, T2 N1. Grade 3, ER, PR negative, HER-2/neu 3+.  . COLONOSCOPY WITH PROPOFOL N/A 01/26/2016   Procedure: COLONOSCOPY WITH PROPOFOL;  Surgeon: [name], MD;  Location: [facility] END[facility]COPY;  Service: Endoscopy;  Laterality: N/A;  . MASTECTOMY Left 02-25-14   Dr Bary Castilla    FAMILY HISTORY :  Family History  Problem Relation Age of Onset  . Hypertension Mother   . Hypertension Maternal Aunt   . Hypertension Maternal Uncle   . Hypertension Maternal Aunt   . Hypertension Maternal Aunt   . Breast cancer Neg Hx     SOCIAL HISTORY:   Social  History   Tobacco Use  . Smoking status: Never Smoker  . Smokeless tobacco: Never Used  Substance Use Topics  . Alcohol use: No  . Drug use: No    ALLERGIES:  has No Known Allergies.  MEDICATIONS:  Current Outpatient Medications  Medication Sig Dispense Refill  . alendronate (FOSAMAX) 70 MG tablet Take 70 mg by mouth once a week. Take with a full glass of water on an empty stomach.    Marland Kitchen amLODipine (NORVASC) 2.5 MG tablet Take 2.5 mg by mouth daily.    Marland Kitchen anastrozole (ARIMIDEX) 1 MG tablet Take 1 tablet (1 mg total) by mouth daily. 90 tablet 4  . aspirin 81 MG tablet Take 81 mg by mouth daily.    . cholecalciferol (VITAMIN D) 1000 UNITS tablet Take 1,000 Units by mouth daily.    Marland Kitchen losartan-hydrochlorothiazide (HYZAAR) 100-25 MG per tablet Take 1 tablet by mouth daily.    . Multiple Vitamins-Minerals (MULTIVITAMIN WITH MINERALS) tablet Take 1 tablet by mouth daily.    . simvastatin (ZOCOR) 40 MG tablet Take 40 mg by mouth at bedtime.    . potassium chloride SA (K-DUR,KLOR-CON) 20 MEQ tablet 1 pill twice a day x1 week; and then one a day 60 tablet 0   No current facility-administered medications for this visit.    Facility-Administered Medications Ordered in Other Visits  Medication Dose Route Frequency Provider Last Rate Last Dose  . sodium chloride flush (NS) 0.9 % injection 10 mL  10 mL Intravenous PRN Cammie Sickle, MD   10 mL at 10/19/16 1054    PHYSICAL EXAMINATION: ECOG PERFORMANCE STATUS: 0 - Asymptomatic  BP (!) 166/68   Pulse 91   Temp 97.6 F (36.4 C) (Tympanic)   Resp 18   Ht 5' (1.524 m)   Wt 127 lb (57.6 kg)   BMI 24.80 kg/m   Filed Weights   10/25/17 1117  Weight: 127 lb (57.6 kg)    Physical Exam  Constitutional: She is oriented to person, place, and time.  Frail-appearing female patient.  She is walking herself.  Accompanied by her daughter.  HENT:  Head: Normocephalic and atraumatic.  Mouth/Throat: Oropharynx is clear and moist. No  oropharyngeal exudate.  Eyes: Pupils are equal, round, and reactive to light.  Neck: Normal range of motion. Neck supple.  Cardiovascular: Normal rate and regular rhythm.  Pulmonary/Chest: No respiratory distress. She has no wheezes.  Abdominal: Soft. Bowel sounds are normal. She exhibits no distension and no mass. There is no tenderness. There is no rebound and no guarding.  Musculoskeletal: Normal range of motion. She exhibits no edema or tenderness.  Neurological: She is alert and oriented to person, place, and time.  Skin: Skin is warm.  Right BREAST exam (in the presence of nurse)- no unusual skin changes or dominant masses felt.  Left mastectomy noted.  No lumps or bumps.  Psychiatric: Affect normal.     LABORATORY DATA:  I have reviewed the data as listed    Component Value Date/Time   NA 140 10/25/2017 1041   NA 136 05/27/2014 0942   K 2.9 (L) 10/25/2017 1041   K 3.4 (L) 05/27/2014 0942   CL  102 10/25/2017 1041   CL 102 05/27/2014 0942   CO2 29 10/25/2017 1041   CO2 29 05/27/2014 0942   GLUCOSE 114 (H) 10/25/2017 1041   GLUCOSE 106 (H) 05/27/2014 0942   BUN 12 10/25/2017 1041   BUN 13 05/27/2014 0942   CREATININE 0.61 10/25/2017 1041   CREATININE 0.58 05/27/2014 0942   CALCIUM 9.2 10/25/2017 1041   CALCIUM 8.6 (L) 05/27/2014 0942   PROT 7.7 10/25/2017 1041   PROT 6.8 05/27/2014 0942   ALBUMIN 4.2 10/25/2017 1041   ALBUMIN 3.8 05/27/2014 0942   AST 28 10/25/2017 1041   AST 18 05/27/2014 0942   ALT 18 10/25/2017 1041   ALT 14 05/27/2014 0942   ALKPHOS 58 10/25/2017 1041   ALKPHOS 69 05/27/2014 0942   BILITOT 1.0 10/25/2017 1041   BILITOT 0.6 05/27/2014 0942   GFRNONAA >60 10/25/2017 1041   GFRNONAA >60 05/27/2014 0942   GFRAA >60 10/25/2017 1041   GFRAA >60 05/27/2014 0942    No results found for: SPEP, UPEP  Lab Results  Component Value Date   WBC 4.6 10/25/2017   NEUTROABS 2.8 10/25/2017   HGB 11.8 (L) 10/25/2017   HCT 34.7 (L) 10/25/2017   MCV 92.1  10/25/2017   PLT 230 10/25/2017      Chemistry      Component Value Date/Time   NA 140 10/25/2017 1041   NA 136 05/27/2014 0942   K 2.9 (L) 10/25/2017 1041   K 3.4 (L) 05/27/2014 0942   CL 102 10/25/2017 1041   CL 102 05/27/2014 0942   CO2 29 10/25/2017 1041   CO2 29 05/27/2014 0942   BUN 12 10/25/2017 1041   BUN 13 05/27/2014 0942   CREATININE 0.61 10/25/2017 1041   CREATININE 0.58 05/27/2014 0942      Component Value Date/Time   CALCIUM 9.2 10/25/2017 1041   CALCIUM 8.6 (L) 05/27/2014 0942   ALKPHOS 58 10/25/2017 1041   ALKPHOS 69 05/27/2014 0942   AST 28 10/25/2017 1041   AST 18 05/27/2014 0942   ALT 18 10/25/2017 1041   ALT 14 05/27/2014 0942   BILITOT 1.0 10/25/2017 1041   BILITOT 0.6 05/27/2014 0942     Results for Behringer, WRENN WILLCOX (MRN [id number]) as of 04/19/2017 11:24  Ref. Range 03/10/2015 13:05 06/21/2015 10:12 09/23/2015 10:12 02/21/2016 11:35 10/19/2016 10:36  CA 27.29 Latest Ref Range: 0.0 - 38.6 U/mL 33.2 35.7 35.3 44.5 (H)   CA 27.29 Latest Ref Range: 0.0 - 38.6 U/mL     38.6  CEA Latest Ref Range: 0.0 - 4.7 ng/mL  2.0        ASSESSMENT & PLAN:   Carcinoma of overlapping sites of left breast in female, estrogen receptor negative ([place]) # 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   # Clinically no evidence of recurrence. Continue arimidex for now.  Stable.   # Elevated tumor marker CA 27-29-Jan 2019 normal. Awaiting from today.  Stable.  # severe hypokalemia- K- 2.9; Kdur bid x 1week; and then 1 a day. # 60; and recheck labs with Dr.Tate.   # History of MALT lymphoma of the lung- clinically no evidence of progression noted.   # Osteoporosis surveillance-she is on fosomax/ vit D;  bone density May 2017 osteopenia.  BMD- April 2019- =-1.8; STABLE.  # port flush every 2 months.    # follow up in 6 months/ labs- ca 27-29/port flush.   Cc;Dr.Tate.  Cammie Sickle,  MD 10/25/2017 11:27 AM

## 2017-10-26 LAB — CANCER ANTIGEN 27.29: CA 27.29: 32.7 U/mL (ref 0.0–38.6)

## 2017-12-11 ENCOUNTER — Other Ambulatory Visit: Payer: Self-pay | Admitting: Internal Medicine

## 2017-12-11 DIAGNOSIS — Z171 Estrogen receptor negative status [ER-]: Principal | ICD-10-CM

## 2017-12-11 DIAGNOSIS — C50812 Malignant neoplasm of overlapping sites of left female breast: Secondary | ICD-10-CM

## 2017-12-13 ENCOUNTER — Inpatient Hospital Stay: Payer: Medicare HMO | Attending: Internal Medicine

## 2017-12-13 DIAGNOSIS — Z95828 Presence of other vascular implants and grafts: Secondary | ICD-10-CM

## 2017-12-13 DIAGNOSIS — C884 Extranodal marginal zone B-cell lymphoma of mucosa-associated lymphoid tissue [MALT-lymphoma]: Secondary | ICD-10-CM | POA: Diagnosis not present

## 2017-12-13 DIAGNOSIS — E876 Hypokalemia: Secondary | ICD-10-CM | POA: Insufficient documentation

## 2017-12-13 DIAGNOSIS — C50812 Malignant neoplasm of overlapping sites of left female breast: Secondary | ICD-10-CM | POA: Diagnosis present

## 2017-12-13 DIAGNOSIS — Z171 Estrogen receptor negative status [ER-]: Secondary | ICD-10-CM | POA: Diagnosis not present

## 2017-12-13 DIAGNOSIS — Z452 Encounter for adjustment and management of vascular access device: Secondary | ICD-10-CM | POA: Insufficient documentation

## 2017-12-13 DIAGNOSIS — M810 Age-related osteoporosis without current pathological fracture: Secondary | ICD-10-CM | POA: Insufficient documentation

## 2017-12-13 DIAGNOSIS — Z79811 Long term (current) use of aromatase inhibitors: Secondary | ICD-10-CM | POA: Diagnosis not present

## 2017-12-13 DIAGNOSIS — Z9221 Personal history of antineoplastic chemotherapy: Secondary | ICD-10-CM | POA: Insufficient documentation

## 2017-12-13 MED ORDER — HEPARIN SOD (PORK) LOCK FLUSH 100 UNIT/ML IV SOLN
500.0000 [IU] | Freq: Once | INTRAVENOUS | Status: AC
  Administered 2017-12-13: 500 [IU] via INTRAVENOUS

## 2017-12-13 MED ORDER — HEPARIN SOD (PORK) LOCK FLUSH 100 UNIT/ML IV SOLN
INTRAVENOUS | Status: AC
  Filled 2017-12-13: qty 5

## 2017-12-13 MED ORDER — SODIUM CHLORIDE 0.9% FLUSH
10.0000 mL | INTRAVENOUS | Status: DC | PRN
  Administered 2017-12-13: 10 mL via INTRAVENOUS
  Filled 2017-12-13: qty 10

## 2017-12-19 ENCOUNTER — Other Ambulatory Visit: Payer: Self-pay | Admitting: *Deleted

## 2017-12-19 DIAGNOSIS — C50912 Malignant neoplasm of unspecified site of left female breast: Secondary | ICD-10-CM

## 2017-12-19 MED ORDER — ANASTROZOLE 1 MG PO TABS: 1.0000 mg | ORAL_TABLET | Freq: Every day | ORAL | 0 refills | Status: DC

## 2018-01-22 ENCOUNTER — Ambulatory Visit (INDEPENDENT_AMBULATORY_CARE_PROVIDER_SITE_OTHER): Payer: Medicare HMO | Admitting: General Surgery

## 2018-01-22 ENCOUNTER — Other Ambulatory Visit: Payer: Self-pay

## 2018-01-22 ENCOUNTER — Encounter: Payer: Self-pay | Admitting: General Surgery

## 2018-01-22 VITALS — BP 163/73 | HR 77 | Temp 97.3°F | Resp 16 | Ht 60.0 in | Wt 127.0 lb

## 2018-01-22 DIAGNOSIS — C50912 Malignant neoplasm of unspecified site of left female breast: Secondary | ICD-10-CM | POA: Diagnosis not present

## 2018-01-22 NOTE — Progress Notes (Signed)
Patient ID: Andrea Rodgers, female   DOB: 04/24/1940, 77 y.o.   MRN: [id number]  Chief Complaint  Patient presents with  . Follow-up    1 yr f/u rec Breast Ca, mammos [facility], 10/17/16 thru PCP    HPI Andrea Rodgers is a 77 y.o. female here today to follow up for 1 year follow up for breast cancer. Patient states she is feeling well.  HPI  Past Medical History:  Diagnosis Date  . Breast cancer ([facility]) 2015   left breast cancer  . Breast cancer metastasized to axillary lymph node ([facility]) August 2015   T2, N1, ER negative, PR negative, HER-2 amplified. 2 cm axillary node. One of 11 nodes positive on post-adjuvant chemotherapy axillary dissection. 3+ centimeter tumor.  . Hyperlipemia   . Hyperlipidemia   . Hypertension   . 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  . MALT lymphoma ([facility])   . Murmur   . Osteoporosis     Past Surgical History:  Procedure Laterality Date  . ABDOMINAL HYSTERECTOMY    . APPENDECTOMY    . BREAST SURGERY Left 02/25/14   Modified radical mastectomy, T2 N1. Grade 3, ER, PR negative, HER-2/neu 3+.  . COLONOSCOPY WITH PROPOFOL N/A 01/26/2016   Procedure: COLONOSCOPY WITH PROPOFOL;  Surgeon: Andrea W Byrnett, MD;  Location: [facility] ENDOSCOPY;  Service: Endoscopy;  Laterality: N/A;  . MASTECTOMY Left 02-25-14   Dr Rodgers    Family History  Problem Relation Age of Onset  . Hypertension Mother   . Hypertension Maternal Aunt   . Hypertension Maternal Uncle   . Hypertension Maternal Aunt   . Hypertension Maternal Aunt   . Breast cancer Neg Hx     Social History Social History   Tobacco Use  . Smoking status: Never Smoker  . Smokeless tobacco: Never Used  Substance Use Topics  . Alcohol use: No  . Drug use: No    No Known Allergies  Current Outpatient Medications  Medication Sig Dispense Refill  . alendronate (FOSAMAX) 70 MG tablet Take 70 mg by mouth once a week. Take with a full glass of water on an  empty stomach.    . amLODipine (NORVASC) 2.5 MG tablet Take 2.5 mg by mouth daily.    . anastrozole (ARIMIDEX) 1 MG tablet Take 1 tablet (1 mg total) by mouth daily. 90 tablet 0  . aspirin 81 MG tablet Take 81 mg by mouth daily.    . cholecalciferol (VITAMIN D) 1000 UNITS tablet Take 1,000 Units by mouth daily.    . losartan-hydrochlorothiazide (HYZAAR) 100-25 MG per tablet Take 1 tablet by mouth daily.    . Multiple Vitamins-Minerals (MULTIVITAMIN WITH MINERALS) tablet Take 1 tablet by mouth daily.    . potassium chloride SA (K-DUR,KLOR-CON) 20 MEQ tablet 1 pill twice a day x1 week; and then one a day 60 tablet 0  . simvastatin (ZOCOR) 40 MG tablet Take 40 mg by mouth at bedtime.     No current facility-administered medications for this visit.    Facility-Administered Medications Ordered in Other Visits  Medication Dose Route Frequency Provider Last Rate Last Dose  . sodium chloride flush (NS) 0.9 % injection 10 mL  10 mL Intravenous PRN Brahmanday, Govinda R, MD   10 mL at 10/19/16 1054  . sodium chloride flush (NS) 0.9 % injection 10 mL  10 mL Intravenous PRN Brahmanday, Govinda R, MD   10 mL at 12/13/17 1050    Review   of Systems Review of Systems  Constitutional: Negative.   Respiratory: Negative.   Cardiovascular: Negative.     Blood pressure (!) 163/73, pulse 77, temperature (!) 97.3 F (36.3 C), temperature source Temporal, resp. rate 16, height 5' (1.524 m), weight 127 lb (57.6 kg), SpO2 97 %.  Physical Exam Physical Exam  Constitutional: She is oriented to person, place, and time. She appears well-developed and well-nourished.  Eyes: Conjunctivae are normal. No scleral icterus.  Cardiovascular: Normal rate, regular rhythm and normal heart sounds.  Pulmonary/Chest: Effort normal and breath sounds normal. Right breast exhibits no inverted nipple, no mass, no nipple discharge, no skin change and no tenderness.  Lymphadenopathy:    She has no cervical adenopathy.   Neurological: She is alert and oriented to person, place, and time.  Skin: Skin is warm and dry.    Data Reviewed October 22, 2017 right screening mammogram reviewed.  BI-RADS-1.  Measurements of the upper extremities were obtained to the location 15 cm above as well as 10 to 20 cm below the olecranon process.  Right: 28, 24, 16 cm.  (February 17, 2016: 29, 25, 17)  Left: 31, 28, 19 cm.  (February 17, 2016: 33, 29, 20 cm)  Assessment    No evidence of recurrent cancer.  No evidence of progression of upper extremity lymphedema.    Plan Patient is to return to the office in 1 year. Call the office with any questions or concerns. HPI, Physical Exam, Assessment and Plan have been scribed under the direction and in the presence of Andrea Ard, Md.  Andrea Rodgers Rodgers. Bobette Mo, CMA  I have completed the exam and reviewed the above documentation for accuracy and completeness.  I agree with the above.  Haematologist has been used and any errors in dictation or transcription are unintentional.  Andrea Rodgers, M.D., F.A.C.S.  Andrea Rodgers Andrea Rodgers 01/24/2018, 8:42 AM

## 2018-01-22 NOTE — Patient Instructions (Signed)
Patient is to return to the office in 1 year.  Call the office with any questions or concerns.

## 2018-01-31 ENCOUNTER — Inpatient Hospital Stay: Payer: Medicare HMO | Attending: Internal Medicine

## 2018-01-31 DIAGNOSIS — C50812 Malignant neoplasm of overlapping sites of left female breast: Secondary | ICD-10-CM | POA: Insufficient documentation

## 2018-01-31 DIAGNOSIS — Z452 Encounter for adjustment and management of vascular access device: Secondary | ICD-10-CM | POA: Insufficient documentation

## 2018-01-31 DIAGNOSIS — Z95828 Presence of other vascular implants and grafts: Secondary | ICD-10-CM

## 2018-01-31 MED ORDER — SODIUM CHLORIDE 0.9% FLUSH
10.0000 mL | Freq: Once | INTRAVENOUS | Status: AC
  Administered 2018-01-31: 10 mL via INTRAVENOUS
  Filled 2018-01-31: qty 10

## 2018-01-31 MED ORDER — HEPARIN SOD (PORK) LOCK FLUSH 100 UNIT/ML IV SOLN
INTRAVENOUS | Status: AC
  Filled 2018-01-31: qty 5

## 2018-01-31 MED ORDER — HEPARIN SOD (PORK) LOCK FLUSH 100 UNIT/ML IV SOLN
500.0000 [IU] | Freq: Once | INTRAVENOUS | Status: AC
  Administered 2018-01-31: 500 [IU] via INTRAVENOUS

## 2018-03-21 ENCOUNTER — Inpatient Hospital Stay: Payer: Medicare HMO | Attending: Internal Medicine

## 2018-03-21 DIAGNOSIS — C884 Extranodal marginal zone B-cell lymphoma of mucosa-associated lymphoid tissue [MALT-lymphoma]: Secondary | ICD-10-CM | POA: Insufficient documentation

## 2018-03-21 DIAGNOSIS — C50812 Malignant neoplasm of overlapping sites of left female breast: Secondary | ICD-10-CM | POA: Insufficient documentation

## 2018-03-21 DIAGNOSIS — Z452 Encounter for adjustment and management of vascular access device: Secondary | ICD-10-CM | POA: Insufficient documentation

## 2018-03-27 ENCOUNTER — Inpatient Hospital Stay: Payer: Medicare HMO

## 2018-03-27 DIAGNOSIS — C50812 Malignant neoplasm of overlapping sites of left female breast: Secondary | ICD-10-CM | POA: Diagnosis present

## 2018-03-27 DIAGNOSIS — Z452 Encounter for adjustment and management of vascular access device: Secondary | ICD-10-CM | POA: Diagnosis not present

## 2018-03-27 DIAGNOSIS — C884 Extranodal marginal zone B-cell lymphoma of mucosa-associated lymphoid tissue [MALT-lymphoma]: Secondary | ICD-10-CM | POA: Diagnosis present

## 2018-03-27 DIAGNOSIS — Z95828 Presence of other vascular implants and grafts: Secondary | ICD-10-CM

## 2018-03-27 MED ORDER — SODIUM CHLORIDE 0.9% FLUSH
10.0000 mL | Freq: Once | INTRAVENOUS | Status: AC
  Administered 2018-03-27: 10 mL via INTRAVENOUS
  Filled 2018-03-27: qty 10

## 2018-03-27 MED ORDER — HEPARIN SOD (PORK) LOCK FLUSH 100 UNIT/ML IV SOLN
500.0000 [IU] | Freq: Once | INTRAVENOUS | Status: AC
  Administered 2018-03-27: 500 [IU] via INTRAVENOUS
  Filled 2018-03-27: qty 5

## 2018-04-24 ENCOUNTER — Inpatient Hospital Stay (HOSPITAL_BASED_OUTPATIENT_CLINIC_OR_DEPARTMENT_OTHER): Payer: Medicare HMO | Admitting: Internal Medicine

## 2018-04-24 ENCOUNTER — Other Ambulatory Visit: Payer: Self-pay

## 2018-04-24 ENCOUNTER — Encounter: Payer: Self-pay | Admitting: Internal Medicine

## 2018-04-24 ENCOUNTER — Inpatient Hospital Stay: Payer: Medicare HMO | Attending: Internal Medicine

## 2018-04-24 VITALS — BP 156/70 | HR 73 | Temp 97.0°F | Resp 16 | Wt 127.4 lb

## 2018-04-24 DIAGNOSIS — C50812 Malignant neoplasm of overlapping sites of left female breast: Secondary | ICD-10-CM | POA: Insufficient documentation

## 2018-04-24 DIAGNOSIS — E876 Hypokalemia: Secondary | ICD-10-CM

## 2018-04-24 DIAGNOSIS — Z79899 Other long term (current) drug therapy: Secondary | ICD-10-CM

## 2018-04-24 DIAGNOSIS — Z79811 Long term (current) use of aromatase inhibitors: Secondary | ICD-10-CM | POA: Diagnosis not present

## 2018-04-24 DIAGNOSIS — Z171 Estrogen receptor negative status [ER-]: Secondary | ICD-10-CM | POA: Insufficient documentation

## 2018-04-24 DIAGNOSIS — C884 Extranodal marginal zone B-cell lymphoma of mucosa-associated lymphoid tissue [MALT-lymphoma]: Secondary | ICD-10-CM | POA: Diagnosis not present

## 2018-04-24 DIAGNOSIS — Z95828 Presence of other vascular implants and grafts: Secondary | ICD-10-CM

## 2018-04-24 DIAGNOSIS — C50912 Malignant neoplasm of unspecified site of left female breast: Secondary | ICD-10-CM

## 2018-04-24 LAB — CBC WITH DIFFERENTIAL/PLATELET
Abs Immature Granulocytes: 0.01 10*3/uL (ref 0.00–0.07)
Basophils Absolute: 0 10*3/uL (ref 0.0–0.1)
Basophils Relative: 0 %
EOS ABS: 0 10*3/uL (ref 0.0–0.5)
EOS PCT: 1 %
HEMATOCRIT: 36.3 % (ref 36.0–46.0)
HEMOGLOBIN: 12.1 g/dL (ref 12.0–15.0)
Immature Granulocytes: 0 %
LYMPHS PCT: 37 %
Lymphs Abs: 1.6 10*3/uL (ref 0.7–4.0)
MCH: 30.6 pg (ref 26.0–34.0)
MCHC: 33.3 g/dL (ref 30.0–36.0)
MCV: 91.7 fL (ref 80.0–100.0)
MONO ABS: 0.7 10*3/uL (ref 0.1–1.0)
Monocytes Relative: 17 %
Neutro Abs: 1.9 10*3/uL (ref 1.7–7.7)
Neutrophils Relative %: 45 %
Platelets: 210 10*3/uL (ref 150–400)
RBC: 3.96 MIL/uL (ref 3.87–5.11)
RDW: 12.7 % (ref 11.5–15.5)
WBC: 4.3 10*3/uL (ref 4.0–10.5)
nRBC: 0 % (ref 0.0–0.2)

## 2018-04-24 LAB — COMPREHENSIVE METABOLIC PANEL
ALK PHOS: 64 U/L (ref 38–126)
ALT: 16 U/L (ref 0–44)
AST: 19 U/L (ref 15–41)
Albumin: 4.2 g/dL (ref 3.5–5.0)
Anion gap: 6 (ref 5–15)
BILIRUBIN TOTAL: 0.7 mg/dL (ref 0.3–1.2)
BUN: 13 mg/dL (ref 8–23)
CALCIUM: 9 mg/dL (ref 8.9–10.3)
CO2: 27 mmol/L (ref 22–32)
CREATININE: 0.66 mg/dL (ref 0.44–1.00)
Chloride: 104 mmol/L (ref 98–111)
GFR calc Af Amer: >60 mL/min (ref >60)
Glucose, Bld: 98 mg/dL (ref 70–99)
Potassium: 3.4 mmol/L — ABNORMAL LOW (ref 3.5–5.1)
Sodium: 137 mmol/L (ref 135–145)
Total Protein: 7.6 g/dL (ref 6.5–8.1)

## 2018-04-24 MED ORDER — SODIUM CHLORIDE 0.9% FLUSH
10.0000 mL | Freq: Once | INTRAVENOUS | Status: AC
  Administered 2018-04-24: 10 mL via INTRAVENOUS
  Filled 2018-04-24: qty 10

## 2018-04-24 MED ORDER — ANASTROZOLE 1 MG PO TABS: 1.0000 mg | ORAL_TABLET | Freq: Every day | ORAL | 0 refills | Status: DC

## 2018-04-24 MED ORDER — HEPARIN SOD (PORK) LOCK FLUSH 100 UNIT/ML IV SOLN
500.0000 [IU] | Freq: Once | INTRAVENOUS | Status: AC
  Administered 2018-04-24: 500 [IU] via INTRAVENOUS

## 2018-04-24 NOTE — Progress Notes (Signed)
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  Patient denies any nausea vomiting daily headaches.  No new lumps or bumps.  Denies any bone pain.   Review of Systems  Constitutional: Positive for malaise/fatigue. Negative for chills, diaphoresis and fever.  HENT: Negative for nosebleeds and sore throat.   Eyes: Negative for double vision.  Respiratory: Negative for cough, hemoptysis, sputum production, shortness of breath and wheezing.   Cardiovascular: Negative for chest pain, palpitations, orthopnea and leg swelling.   Gastrointestinal: Negative for abdominal pain, blood in stool, constipation, diarrhea, heartburn, melena, nausea and vomiting.  Genitourinary: Negative for dysuria, frequency and urgency.  Musculoskeletal: Negative for back pain and joint pain.  Skin: Negative.  Negative for itching and rash.  Neurological: Negative for dizziness, tingling, focal weakness, weakness and headaches.  Endo/Heme/Allergies: Does not bruise/bleed easily.  Psychiatric/Behavioral: Negative for depression. The patient is not nervous/anxious and does not have insomnia.      PAST MEDICAL HISTORY :  Past Medical History:  Diagnosis Date  . Breast cancer ([place]) 2015   left breast cancer  . Breast cancer metastasized to axillary lymph node ([place]) August 2015   T2, N1, ER negative, PR negative, HER-2 amplified. 2 cm axillary node. One of 11 nodes positive on post-adjuvant chemotherapy axillary dissection. 3+ centimeter tumor.  . Hyperlipemia   . Hyperlipidemia   . Hypertension   . 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  Marland Kitchen MALT lymphoma ([place])   . Murmur   . Osteoporosis     PAST SURGICAL HISTORY :   Past Surgical History:  Procedure Laterality Date  . ABDOMINAL HYSTERECTOMY    . APPENDECTOMY    . BREAST SURGERY Left 02/25/14   Modified radical mastectomy, T2 N1. Grade 3, ER, PR negative, HER-2/neu 3+.  . COLONOSCOPY WITH PROPOFOL N/A 01/26/2016   Procedure: COLONOSCOPY WITH PROPOFOL;  Surgeon: [name], MD;  Location: [facility] ENDOSCOPY;  Service: Endoscopy;  Laterality: N/A;  . MASTECTOMY Left 02-25-14   Dr Bary Castilla    FAMILY HISTORY :   Family History  Problem Relation  Age of Onset  . Hypertension Mother   . Hypertension Maternal Aunt   . Hypertension Maternal Uncle   . Hypertension Maternal Aunt   . Hypertension Maternal Aunt   . Breast cancer Neg Hx     SOCIAL HISTORY:   Social History   Tobacco Use  . Smoking status: Never Smoker  .  Smokeless tobacco: Never Used  Substance Use Topics  . Alcohol use: No  . Drug use: No    ALLERGIES:  has No Known Allergies.  MEDICATIONS:  Current Outpatient Medications  Medication Sig Dispense Refill  . alendronate (FOSAMAX) 70 MG tablet Take 70 mg by mouth once a week. Take with a full glass of water on an empty stomach.    Marland Kitchen amLODipine (NORVASC) 2.5 MG tablet Take 2.5 mg by mouth daily.    Marland Kitchen anastrozole (ARIMIDEX) 1 MG tablet Take 1 tablet (1 mg total) by mouth daily. 90 tablet 0  . aspirin 81 MG tablet Take 81 mg by mouth daily.    . cholecalciferol (VITAMIN D) 1000 UNITS tablet Take 1,000 Units by mouth daily.    Marland Kitchen losartan-hydrochlorothiazide (HYZAAR) 100-25 MG per tablet Take 1 tablet by mouth daily.    . Multiple Vitamins-Minerals (MULTIVITAMIN WITH MINERALS) tablet Take 1 tablet by mouth daily.    . potassium chloride SA (K-DUR,KLOR-CON) 20 MEQ tablet 1 pill twice a day x1 week; and then one a day 60 tablet 0  . simvastatin (ZOCOR) 40 MG tablet Take 40 mg by mouth at bedtime.     No current facility-administered medications for this visit.    Facility-Administered Medications Ordered in Other Visits  Medication Dose Route Frequency Provider Last Rate Last Dose  . sodium chloride flush (NS) 0.9 % injection 10 mL  10 mL Intravenous PRN Cammie Sickle, MD   10 mL at 10/19/16 1054  . sodium chloride flush (NS) 0.9 % injection 10 mL  10 mL Intravenous PRN Cammie Sickle, MD   10 mL at 12/13/17 1050    PHYSICAL EXAMINATION: ECOG PERFORMANCE STATUS: 0 - Asymptomatic  BP (!) 156/70 Comment: manual blood pressure right arm  Pulse 73   Temp (!) 97 F (36.1 C) (Tympanic)   Resp 16   Wt 127 lb 6.4 oz (57.8 kg)   BMI 24.88 kg/m   Filed Weights   04/24/18 1054  Weight: 127 lb 6.4 oz (57.8 kg)    Physical Exam  Constitutional: She is oriented to person, place, and time.  Frail-appearing female patient.  She is walking herself.  Accompanied by her daughter.   HENT:  Head: Normocephalic and atraumatic.  Mouth/Throat: Oropharynx is clear and moist. No oropharyngeal exudate.  Eyes: Pupils are equal, round, and reactive to light.  Neck: Normal range of motion. Neck supple.  Cardiovascular: Normal rate and regular rhythm.  Pulmonary/Chest: No respiratory distress. She has no wheezes.  Abdominal: Soft. Bowel sounds are normal. She exhibits no distension and no mass. There is no abdominal tenderness. There is no rebound and no guarding.  Musculoskeletal: Normal range of motion.        General: No tenderness or edema.  Neurological: She is alert and oriented to person, place, and time.  Skin: Skin is warm.  Right BREAST exam (in the presence of nurse)- no unusual skin changes or dominant masses felt.  Left mastectomy noted.  No lumps or bumps.  Psychiatric: Affect normal.     LABORATORY DATA:  I have reviewed the data as listed  Component Value Date/Time   NA 137 04/24/2018 1000   NA 136 05/27/2014 0942   K 3.4 (L) 04/24/2018 1000   K 3.4 (L) 05/27/2014 0942   CL 104 04/24/2018 1000   CL 102 05/27/2014 0942   CO2 27 04/24/2018 1000   CO2 29 05/27/2014 0942   GLUCOSE 98 04/24/2018 1000   GLUCOSE 106 (H) 05/27/2014 0942   BUN 13 04/24/2018 1000   BUN 13 05/27/2014 0942   CREATININE 0.66 04/24/2018 1000   CREATININE 0.58 05/27/2014 0942   CALCIUM 9.0 04/24/2018 1000   CALCIUM 8.6 (L) 05/27/2014 0942   PROT 7.6 04/24/2018 1000   PROT 6.8 05/27/2014 0942   ALBUMIN 4.2 04/24/2018 1000   ALBUMIN 3.8 05/27/2014 0942   AST 19 04/24/2018 1000   AST 18 05/27/2014 0942   ALT 16 04/24/2018 1000   ALT 14 05/27/2014 0942   ALKPHOS 64 04/24/2018 1000   ALKPHOS 69 05/27/2014 0942   BILITOT 0.7 04/24/2018 1000   BILITOT 0.6 05/27/2014 0942   GFRNONAA >60 04/24/2018 1000   GFRNONAA >60 05/27/2014 0942   GFRAA >60 04/24/2018 1000   GFRAA >60 05/27/2014 0942    No results found for: SPEP, UPEP  Lab Results  Component Value Date   WBC 4.3  04/24/2018   NEUTROABS 1.9 04/24/2018   HGB 12.1 04/24/2018   HCT 36.3 04/24/2018   MCV 91.7 04/24/2018   PLT 210 04/24/2018      Chemistry      Component Value Date/Time   NA 137 04/24/2018 1000   NA 136 05/27/2014 0942   K 3.4 (L) 04/24/2018 1000   K 3.4 (L) 05/27/2014 0942   CL 104 04/24/2018 1000   CL 102 05/27/2014 0942   CO2 27 04/24/2018 1000   CO2 29 05/27/2014 0942   BUN 13 04/24/2018 1000   BUN 13 05/27/2014 0942   CREATININE 0.66 04/24/2018 1000   CREATININE 0.58 05/27/2014 0942      Component Value Date/Time   CALCIUM 9.0 04/24/2018 1000   CALCIUM 8.6 (L) 05/27/2014 0942   ALKPHOS 64 04/24/2018 1000   ALKPHOS 69 05/27/2014 0942   AST 19 04/24/2018 1000   AST 18 05/27/2014 0942   ALT 16 04/24/2018 1000   ALT 14 05/27/2014 0942   BILITOT 0.7 04/24/2018 1000   BILITOT 0.6 05/27/2014 0942     Results for Pinho, KAMBREE KRAUSS (MRN [id number]) as of 04/19/2017 11:24  Ref. Range 03/10/2015 13:05 06/21/2015 10:12 09/23/2015 10:12 02/21/2016 11:35 10/19/2016 10:36  CA 27.29 Latest Ref Range: 0.0 - 38.6 U/mL 33.2 35.7 35.3 44.5 (H)   CA 27.29 Latest Ref Range: 0.0 - 38.6 U/mL     38.6  CEA Latest Ref Range: 0.0 - 4.7 ng/mL  2.0        ASSESSMENT & PLAN:   Carcinoma of overlapping sites of left breast in female, estrogen receptor negative ([place]) # 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  Stable.  # Clinically no evidence of recurrence. Continue arimidex for now.  STABLE. Sep 2019- mammogram- wnl.   # HTN- poorly controlled; recommend monitoring BPs at home.    # Elevated tumor marker CA 27-29-March 2020 normal.  Monitor for now.  #Hypokalemia mild stable.  # History of MALT lymphoma of the lung- clinically no evidence of progression noted. STABLE.   # Osteoporosis surveillance-she is on fosomax/ vit D;  bone density May 2017 osteopenia.  BMD- April  2019- =-1.8; stable.   # port flush every 2  months; keep for now.   # DISPOSITION:  # follow up in 6 months-MD/ labs-cbc/cmp/ ca 27-29/port flush-Dr.B.   Cc;Dr.Tate.      Cammie Sickle, MD 04/28/2018 10:36 AM

## 2018-04-24 NOTE — Assessment & Plan Note (Addendum)
#  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  Stable.  # Clinically no evidence of recurrence. Continue arimidex for now.  STABLE. Sep 2019- mammogram- wnl.   # HTN- poorly controlled; recommend monitoring BPs at home.    # Elevated tumor marker CA 27-29-March 2020 normal.  Monitor for now.  #Hypokalemia mild stable.  # History of MALT lymphoma of the lung- clinically no evidence of progression noted. STABLE.   # Osteoporosis surveillance-she is on fosomax/ vit D;  bone density May 2017 osteopenia.  BMD- April 2019- =-1.8; stable.   # port flush every 2 months; keep for now.   # DISPOSITION:  # follow up in 6 months-MD/ labs-cbc/cmp/ ca 27-29/port flush-Dr.B.   Cc;Dr.Tate.

## 2018-04-25 LAB — CANCER ANTIGEN 27.29: CAN 27.29: 33.4 U/mL (ref 0.0–38.6)

## 2018-06-25 ENCOUNTER — Other Ambulatory Visit: Payer: Self-pay

## 2018-06-26 ENCOUNTER — Inpatient Hospital Stay: Payer: 59 | Attending: Internal Medicine

## 2018-06-26 ENCOUNTER — Other Ambulatory Visit: Payer: Self-pay

## 2018-06-26 DIAGNOSIS — Z95828 Presence of other vascular implants and grafts: Secondary | ICD-10-CM

## 2018-06-26 DIAGNOSIS — Z452 Encounter for adjustment and management of vascular access device: Secondary | ICD-10-CM | POA: Diagnosis not present

## 2018-06-26 DIAGNOSIS — Z171 Estrogen receptor negative status [ER-]: Secondary | ICD-10-CM | POA: Insufficient documentation

## 2018-06-26 DIAGNOSIS — C50812 Malignant neoplasm of overlapping sites of left female breast: Secondary | ICD-10-CM | POA: Diagnosis not present

## 2018-06-26 DIAGNOSIS — C884 Extranodal marginal zone B-cell lymphoma of mucosa-associated lymphoid tissue [MALT-lymphoma]: Secondary | ICD-10-CM | POA: Insufficient documentation

## 2018-06-26 MED ORDER — SODIUM CHLORIDE 0.9% FLUSH
10.0000 mL | Freq: Once | INTRAVENOUS | Status: AC
  Administered 2018-06-26: 10:00:00 10 mL via INTRAVENOUS
  Filled 2018-06-26: qty 10

## 2018-06-26 MED ORDER — HEPARIN SOD (PORK) LOCK FLUSH 100 UNIT/ML IV SOLN
500.0000 [IU] | Freq: Once | INTRAVENOUS | Status: AC
  Administered 2018-06-26: 10:00:00 500 [IU] via INTRAVENOUS

## 2018-08-12 ENCOUNTER — Other Ambulatory Visit: Payer: Self-pay | Admitting: Internal Medicine

## 2018-08-12 DIAGNOSIS — C50912 Malignant neoplasm of unspecified site of left female breast: Secondary | ICD-10-CM

## 2018-08-27 ENCOUNTER — Other Ambulatory Visit: Payer: Self-pay

## 2018-08-28 ENCOUNTER — Other Ambulatory Visit: Payer: Self-pay

## 2018-08-28 ENCOUNTER — Inpatient Hospital Stay: Payer: Medicare HMO | Attending: Internal Medicine

## 2018-08-28 DIAGNOSIS — Z171 Estrogen receptor negative status [ER-]: Secondary | ICD-10-CM | POA: Insufficient documentation

## 2018-08-28 DIAGNOSIS — C50812 Malignant neoplasm of overlapping sites of left female breast: Secondary | ICD-10-CM | POA: Diagnosis not present

## 2018-08-28 DIAGNOSIS — Z95828 Presence of other vascular implants and grafts: Secondary | ICD-10-CM

## 2018-08-28 DIAGNOSIS — C884 Extranodal marginal zone B-cell lymphoma of mucosa-associated lymphoid tissue [MALT-lymphoma]: Secondary | ICD-10-CM | POA: Insufficient documentation

## 2018-08-28 DIAGNOSIS — Z452 Encounter for adjustment and management of vascular access device: Secondary | ICD-10-CM | POA: Insufficient documentation

## 2018-08-28 MED ORDER — HEPARIN SOD (PORK) LOCK FLUSH 100 UNIT/ML IV SOLN
500.0000 [IU] | Freq: Once | INTRAVENOUS | Status: AC
  Administered 2018-08-28: 500 [IU] via INTRAVENOUS

## 2018-08-28 MED ORDER — SODIUM CHLORIDE 0.9% FLUSH
10.0000 mL | Freq: Once | INTRAVENOUS | Status: AC
  Administered 2018-08-28: 10 mL via INTRAVENOUS
  Filled 2018-08-28: qty 10

## 2018-09-06 ENCOUNTER — Encounter: Payer: Self-pay | Admitting: General Surgery

## 2018-09-17 ENCOUNTER — Other Ambulatory Visit: Payer: Self-pay | Admitting: Internal Medicine

## 2018-09-17 DIAGNOSIS — Z1231 Encounter for screening mammogram for malignant neoplasm of breast: Secondary | ICD-10-CM

## 2018-10-22 ENCOUNTER — Other Ambulatory Visit: Payer: Self-pay

## 2018-10-22 DIAGNOSIS — C50812 Malignant neoplasm of overlapping sites of left female breast: Secondary | ICD-10-CM

## 2018-10-22 NOTE — Progress Notes (Signed)
Left message to call back, wanting to get pre charting completed

## 2018-10-23 ENCOUNTER — Encounter: Payer: Self-pay | Admitting: Internal Medicine

## 2018-10-23 ENCOUNTER — Other Ambulatory Visit: Payer: Self-pay

## 2018-10-23 ENCOUNTER — Inpatient Hospital Stay (HOSPITAL_BASED_OUTPATIENT_CLINIC_OR_DEPARTMENT_OTHER): Payer: Medicare HMO | Admitting: Internal Medicine

## 2018-10-23 ENCOUNTER — Inpatient Hospital Stay: Payer: Medicare HMO | Attending: Internal Medicine

## 2018-10-23 DIAGNOSIS — E785 Hyperlipidemia, unspecified: Secondary | ICD-10-CM | POA: Diagnosis not present

## 2018-10-23 DIAGNOSIS — Z171 Estrogen receptor negative status [ER-]: Secondary | ICD-10-CM | POA: Diagnosis not present

## 2018-10-23 DIAGNOSIS — I10 Essential (primary) hypertension: Secondary | ICD-10-CM | POA: Diagnosis not present

## 2018-10-23 DIAGNOSIS — Z79811 Long term (current) use of aromatase inhibitors: Secondary | ICD-10-CM | POA: Insufficient documentation

## 2018-10-23 DIAGNOSIS — C884 Extranodal marginal zone B-cell lymphoma of mucosa-associated lymphoid tissue [MALT-lymphoma]: Secondary | ICD-10-CM | POA: Insufficient documentation

## 2018-10-23 DIAGNOSIS — Z79899 Other long term (current) drug therapy: Secondary | ICD-10-CM | POA: Insufficient documentation

## 2018-10-23 DIAGNOSIS — Z95828 Presence of other vascular implants and grafts: Secondary | ICD-10-CM

## 2018-10-23 DIAGNOSIS — C50812 Malignant neoplasm of overlapping sites of left female breast: Secondary | ICD-10-CM

## 2018-10-23 DIAGNOSIS — Z7982 Long term (current) use of aspirin: Secondary | ICD-10-CM | POA: Insufficient documentation

## 2018-10-23 LAB — CBC WITH DIFFERENTIAL/PLATELET
Abs Immature Granulocytes: 0.02 10*3/uL (ref 0.00–0.07)
Basophils Absolute: 0 10*3/uL (ref 0.0–0.1)
Basophils Relative: 0 %
Eosinophils Absolute: 0 10*3/uL (ref 0.0–0.5)
Eosinophils Relative: 0 %
HCT: 36.6 % (ref 36.0–46.0)
Hemoglobin: 12.1 g/dL (ref 12.0–15.0)
Immature Granulocytes: 0 %
Lymphocytes Relative: 24 %
Lymphs Abs: 1.4 10*3/uL (ref 0.7–4.0)
MCH: 30.6 pg (ref 26.0–34.0)
MCHC: 33.1 g/dL (ref 30.0–36.0)
MCV: 92.4 fL (ref 80.0–100.0)
Monocytes Absolute: 0.8 10*3/uL (ref 0.1–1.0)
Monocytes Relative: 15 %
Neutro Abs: 3.4 10*3/uL (ref 1.7–7.7)
Neutrophils Relative %: 61 %
Platelets: 208 10*3/uL (ref 150–400)
RBC: 3.96 MIL/uL (ref 3.87–5.11)
RDW: 12.5 % (ref 11.5–15.5)
WBC: 5.6 10*3/uL (ref 4.0–10.5)
nRBC: 0 % (ref 0.0–0.2)

## 2018-10-23 LAB — COMPREHENSIVE METABOLIC PANEL
ALT: 14 U/L (ref 0–44)
AST: 17 U/L (ref 15–41)
Albumin: 4.1 g/dL (ref 3.5–5.0)
Alkaline Phosphatase: 59 U/L (ref 38–126)
Anion gap: 9 (ref 5–15)
BUN: 16 mg/dL (ref 8–23)
CO2: 28 mmol/L (ref 22–32)
Calcium: 9 mg/dL (ref 8.9–10.3)
Chloride: 104 mmol/L (ref 98–111)
Creatinine, Ser: 0.66 mg/dL (ref 0.44–1.00)
GFR calc Af Amer: >60 mL/min (ref >60)
GFR calc non Af Amer: >60 mL/min (ref >60)
Glucose, Bld: 95 mg/dL (ref 70–99)
Potassium: 3.6 mmol/L (ref 3.5–5.1)
Sodium: 141 mmol/L (ref 135–145)
Total Bilirubin: 0.4 mg/dL (ref 0.3–1.2)
Total Protein: 7.1 g/dL (ref 6.5–8.1)

## 2018-10-23 MED ORDER — HEPARIN SOD (PORK) LOCK FLUSH 100 UNIT/ML IV SOLN
500.0000 [IU] | Freq: Once | INTRAVENOUS | Status: AC
  Administered 2018-10-23: 500 [IU] via INTRAVENOUS

## 2018-10-23 MED ORDER — HEPARIN SOD (PORK) LOCK FLUSH 100 UNIT/ML IV SOLN
INTRAVENOUS | Status: AC
  Filled 2018-10-23: qty 5

## 2018-10-23 MED ORDER — SODIUM CHLORIDE 0.9% FLUSH
10.0000 mL | Freq: Once | INTRAVENOUS | Status: AC
  Administered 2018-10-23: 10 mL via INTRAVENOUS
  Filled 2018-10-23: qty 10

## 2018-10-23 NOTE — Assessment & Plan Note (Addendum)
#  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  Stable.  # Clinically no evidence of recurrence. Continue arimidex for now. Sep 2019- mammogram- wnl; awaiting mammogram this week.   # HTN- poorly controlled; recommend monitoring BPs at home.    # Elevated tumor marker CA 27-29-March 2020 normal.  Stable; await labs from today.  # History of MALT lymphoma of the lung- clinically no evidence of progression noted. Stable.   # Osteoporosis surveillance-she is on fosomax/ vit D;  bone density April 2019 osteopenia. =-1.8; Stable  # port flush every 2 months; keep for now.   # DISPOSITION:  # port flush every 2 months # follow up in 6 months-MD/ labs-cbc/cmp/ ca 27-29/port flush-Dr.B.   Cc;Dr.Tate.

## 2018-10-23 NOTE — Progress Notes (Signed)
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  Patient denies any headaches nausea vomiting.  Appetite is good.  No weight loss.  She denies any bone pain.   Review of Systems  Constitutional: Positive for malaise/fatigue. Negative for chills, diaphoresis and fever.  HENT: Negative for nosebleeds and sore throat.   Eyes: Negative for double vision.  Respiratory: Negative for cough, hemoptysis, sputum production, shortness of breath and wheezing.   Cardiovascular: Negative for chest pain, palpitations, orthopnea and  leg swelling.  Gastrointestinal: Negative for abdominal pain, blood in stool, constipation, diarrhea, heartburn, melena, nausea and vomiting.  Genitourinary: Negative for dysuria, frequency and urgency.  Musculoskeletal: Positive for back pain and joint pain.  Skin: Negative.  Negative for itching and rash.  Neurological: Negative for dizziness, tingling, focal weakness, weakness and headaches.  Endo/Heme/Allergies: Does not bruise/bleed easily.  Psychiatric/Behavioral: Negative for depression. The patient is not nervous/anxious and does not have insomnia.      PAST MEDICAL HISTORY :  Past Medical History:  Diagnosis Date  . Breast cancer ([place]) 2015   left breast cancer  . Breast cancer metastasized to axillary lymph node ([place]) August 2015   T2, N1, ER negative, PR negative, HER-2 amplified. 2 cm axillary node. One of 11 nodes positive on post-adjuvant chemotherapy axillary dissection. 3+ centimeter tumor.  . Hyperlipemia   . Hyperlipidemia   . Hypertension   . 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  Marland Kitchen MALT lymphoma ([place])   . Murmur   . Osteoporosis     PAST SURGICAL HISTORY :   Past Surgical History:  Procedure Laterality Date  . ABDOMINAL HYSTERECTOMY    . APPENDECTOMY    . BREAST SURGERY Left 02/25/14   Modified radical mastectomy, T2 N1. Grade 3, ER, PR negative, HER-2/neu 3+.  . COLONOSCOPY WITH PROPOFOL N/A 01/26/2016   Procedure: COLONOSCOPY WITH PROPOFOL;  Surgeon: [name], MD;  Location: [facility] ENDOSCOPY;  Service: Endoscopy;  Laterality: N/A;  . MASTECTOMY Left 02-25-14   Dr Bary Castilla    FAMILY HISTORY :   Family History  Problem Relation Age of Onset  .  Hypertension Mother   . Hypertension Maternal Aunt   . Hypertension Maternal Uncle   . Hypertension Maternal Aunt   . Hypertension Maternal Aunt   . Breast cancer Neg Hx     SOCIAL HISTORY:   Social History   Tobacco Use  . Smoking status: Never  Smoker  . Smokeless tobacco: Never Used  Substance Use Topics  . Alcohol use: No  . Drug use: No    ALLERGIES:  has No Known Allergies.  MEDICATIONS:  Current Outpatient Medications  Medication Sig Dispense Refill  . alendronate (FOSAMAX) 70 MG tablet Take 70 mg by mouth once a week. Take with a full glass of water on an empty stomach.    Marland Kitchen amLODipine (NORVASC) 2.5 MG tablet Take 2.5 mg by mouth daily.    Marland Kitchen anastrozole (ARIMIDEX) 1 MG tablet TAKE ONE TABLET BY MOUTH ONCE DAILY 90 tablet 3  . cholecalciferol (VITAMIN D) 1000 UNITS tablet Take 1,000 Units by mouth daily.    Marland Kitchen losartan-hydrochlorothiazide (HYZAAR) 100-25 MG per tablet Take 1 tablet by mouth daily.    . Multiple Vitamins-Minerals (MULTIVITAMIN WITH MINERALS) tablet Take 1 tablet by mouth daily.    . potassium chloride SA (K-DUR,KLOR-CON) 20 MEQ tablet 1 pill twice a day x1 week; and then one a day 60 tablet 0  . simvastatin (ZOCOR) 40 MG tablet Take 40 mg by mouth at bedtime.    Marland Kitchen aspirin 81 MG tablet Take 81 mg by mouth daily.     No current facility-administered medications for this visit.    Facility-Administered Medications Ordered in Other Visits  Medication Dose Route Frequency Provider Last Rate Last Dose  . sodium chloride flush (NS) 0.9 % injection 10 mL  10 mL Intravenous PRN Cammie Sickle, MD   10 mL at 10/19/16 1054  . sodium chloride flush (NS) 0.9 % injection 10 mL  10 mL Intravenous PRN Cammie Sickle, MD   10 mL at 12/13/17 1050    PHYSICAL EXAMINATION: ECOG PERFORMANCE STATUS: 0 - Asymptomatic  BP (!) 171/72 (BP Location: Right Arm, Patient Position: Sitting)   Pulse 73   Temp (!) 97.3 F (36.3 C) (Tympanic)   Ht 5' (1.524 m)   Wt 126 lb 12.8 oz (57.5 kg)   BMI 24.76 kg/m   Filed Weights   10/23/18 1047  Weight: 126 lb 12.8 oz (57.5 kg)    Physical Exam  Constitutional: She is oriented to person, place, and time.  Frail-appearing female patient.  She is walking herself.   Alone.  HENT:  Head: Normocephalic and atraumatic.  Mouth/Throat: Oropharynx is clear and moist. No oropharyngeal exudate.  Eyes: Pupils are equal, round, and reactive to light.  Neck: Normal range of motion. Neck supple.  Cardiovascular: Normal rate and regular rhythm.  Pulmonary/Chest: No respiratory distress. She has no wheezes.  Abdominal: Soft. Bowel sounds are normal. She exhibits no distension and no mass. There is no abdominal tenderness. There is no rebound and no guarding.  Musculoskeletal: Normal range of motion.        General: No tenderness or edema.  Neurological: She is alert and oriented to person, place, and time.  Skin: Skin is warm.  Right BREAST exam (in the presence of nurse)- no unusual skin changes or dominant masses felt.  Left mastectomy noted.  No lumps or bumps.  Psychiatric: Affect normal.     LABORATORY DATA:  I have reviewed the data as listed    Component Value Date/Time  NA 141 10/23/2018 1027   NA 136 05/27/2014 0942   K 3.6 10/23/2018 1027   K 3.4 (L) 05/27/2014 0942   CL 104 10/23/2018 1027   CL 102 05/27/2014 0942   CO2 28 10/23/2018 1027   CO2 29 05/27/2014 0942   GLUCOSE 95 10/23/2018 1027   GLUCOSE 106 (H) 05/27/2014 0942   BUN 16 10/23/2018 1027   BUN 13 05/27/2014 0942   CREATININE 0.66 10/23/2018 1027   CREATININE 0.58 05/27/2014 0942   CALCIUM 9.0 10/23/2018 1027   CALCIUM 8.6 (L) 05/27/2014 0942   PROT 7.1 10/23/2018 1027   PROT 6.8 05/27/2014 0942   ALBUMIN 4.1 10/23/2018 1027   ALBUMIN 3.8 05/27/2014 0942   AST 17 10/23/2018 1027   AST 18 05/27/2014 0942   ALT 14 10/23/2018 1027   ALT 14 05/27/2014 0942   ALKPHOS 59 10/23/2018 1027   ALKPHOS 69 05/27/2014 0942   BILITOT 0.4 10/23/2018 1027   BILITOT 0.6 05/27/2014 0942   GFRNONAA >60 10/23/2018 1027   GFRNONAA >60 05/27/2014 0942   GFRAA >60 10/23/2018 1027   GFRAA >60 05/27/2014 0942    No results found for: SPEP, UPEP  Lab Results  Component Value Date   WBC  5.6 10/23/2018   NEUTROABS 3.4 10/23/2018   HGB 12.1 10/23/2018   HCT 36.6 10/23/2018   MCV 92.4 10/23/2018   PLT 208 10/23/2018      Chemistry      Component Value Date/Time   NA 141 10/23/2018 1027   NA 136 05/27/2014 0942   K 3.6 10/23/2018 1027   K 3.4 (L) 05/27/2014 0942   CL 104 10/23/2018 1027   CL 102 05/27/2014 0942   CO2 28 10/23/2018 1027   CO2 29 05/27/2014 0942   BUN 16 10/23/2018 1027   BUN 13 05/27/2014 0942   CREATININE 0.66 10/23/2018 1027   CREATININE 0.58 05/27/2014 0942      Component Value Date/Time   CALCIUM 9.0 10/23/2018 1027   CALCIUM 8.6 (L) 05/27/2014 0942   ALKPHOS 59 10/23/2018 1027   ALKPHOS 69 05/27/2014 0942   AST 17 10/23/2018 1027   AST 18 05/27/2014 0942   ALT 14 10/23/2018 1027   ALT 14 05/27/2014 0942   BILITOT 0.4 10/23/2018 1027   BILITOT 0.6 05/27/2014 0942       ASSESSMENT & PLAN:   Carcinoma of overlapping sites of left breast in female, estrogen receptor negative ([place]) # 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  Stable.  # Clinically no evidence of recurrence. Continue arimidex for now. Sep 2019- mammogram- wnl; awaiting mammogram this week.   # HTN- poorly controlled; recommend monitoring BPs at home.    # Elevated tumor marker CA 27-29-March 2020 normal.  Stable; await labs from today.  # History of MALT lymphoma of the lung- clinically no evidence of progression noted. Stable.   # Osteoporosis surveillance-she is on fosomax/ vit D;  bone density April 2019 osteopenia. =-1.8; Stable  # port flush every 2 months; keep for now.   # DISPOSITION:  # port flush every 2 months # follow up in 6 months-MD/ labs-cbc/cmp/ ca 27-29/port flush-Dr.B.   Cc;Dr.Tate.      Cammie Sickle, MD 10/23/2018 1:08 PM

## 2018-10-24 LAB — CANCER ANTIGEN 27.29: CA 27.29: 27.5 U/mL (ref 0.0–38.6)

## 2018-10-24 NOTE — Progress Notes (Signed)
Heather/Brooke-please inform patient's daughter that tumor markers normal.  No new recommendations follow-up as planned.

## 2018-10-25 ENCOUNTER — Telehealth: Payer: Self-pay | Admitting: *Deleted

## 2018-10-25 ENCOUNTER — Ambulatory Visit
Admission: RE | Admit: 2018-10-25 | Discharge: 2018-10-25 | Disposition: A | Discharge status: Home / Self Care | Payer: Medicare HMO | Source: Ambulatory Visit | Attending: Internal Medicine | Admitting: Internal Medicine

## 2018-10-25 DIAGNOSIS — Z1231 Encounter for screening mammogram for malignant neoplasm of breast: Secondary | ICD-10-CM

## 2018-10-25 HISTORY — DX: Personal history of antineoplastic chemotherapy: Z92.21

## 2018-10-25 NOTE — Telephone Encounter (Signed)
Unable to reach patient. Phone just rings.

## 2018-10-25 NOTE — Telephone Encounter (Signed)
-----   Message from Cammie Sickle, MD sent at 10/24/2018  7:21 AM EDT ----- [place]one/Brooke-please inform patient's daughter that tumor markers normal.  No new recommendations follow-up as planned.

## 2018-12-24 ENCOUNTER — Other Ambulatory Visit: Payer: Self-pay

## 2018-12-25 ENCOUNTER — Inpatient Hospital Stay: Payer: Medicare HMO | Attending: Internal Medicine

## 2018-12-25 ENCOUNTER — Other Ambulatory Visit: Payer: Self-pay

## 2018-12-25 DIAGNOSIS — C50812 Malignant neoplasm of overlapping sites of left female breast: Secondary | ICD-10-CM | POA: Diagnosis not present

## 2018-12-25 DIAGNOSIS — Z79811 Long term (current) use of aromatase inhibitors: Secondary | ICD-10-CM | POA: Insufficient documentation

## 2018-12-25 DIAGNOSIS — Z7982 Long term (current) use of aspirin: Secondary | ICD-10-CM | POA: Diagnosis not present

## 2018-12-25 DIAGNOSIS — C884 Extranodal marginal zone B-cell lymphoma of mucosa-associated lymphoid tissue [MALT-lymphoma]: Secondary | ICD-10-CM | POA: Insufficient documentation

## 2018-12-25 DIAGNOSIS — Z452 Encounter for adjustment and management of vascular access device: Secondary | ICD-10-CM | POA: Diagnosis not present

## 2018-12-25 DIAGNOSIS — R785 Finding of other psychotropic drug in blood: Secondary | ICD-10-CM | POA: Insufficient documentation

## 2018-12-25 DIAGNOSIS — Z95828 Presence of other vascular implants and grafts: Secondary | ICD-10-CM

## 2018-12-25 DIAGNOSIS — Z171 Estrogen receptor negative status [ER-]: Secondary | ICD-10-CM | POA: Insufficient documentation

## 2018-12-25 DIAGNOSIS — Z79899 Other long term (current) drug therapy: Secondary | ICD-10-CM | POA: Insufficient documentation

## 2018-12-25 DIAGNOSIS — I10 Essential (primary) hypertension: Secondary | ICD-10-CM | POA: Insufficient documentation

## 2018-12-25 MED ORDER — SODIUM CHLORIDE 0.9% FLUSH
10.0000 mL | Freq: Once | INTRAVENOUS | Status: AC
  Administered 2018-12-25: 10 mL via INTRAVENOUS
  Filled 2018-12-25: qty 10

## 2018-12-25 MED ORDER — HEPARIN SOD (PORK) LOCK FLUSH 100 UNIT/ML IV SOLN
500.0000 [IU] | Freq: Once | INTRAVENOUS | Status: AC
  Administered 2018-12-25: 500 [IU] via INTRAVENOUS

## 2019-02-24 ENCOUNTER — Inpatient Hospital Stay: Payer: Medicare HMO | Attending: Internal Medicine

## 2019-04-10 ENCOUNTER — Ambulatory Visit: Payer: Medicare HMO | Attending: Internal Medicine

## 2019-04-10 DIAGNOSIS — Z23 Encounter for immunization: Secondary | ICD-10-CM | POA: Insufficient documentation

## 2019-04-10 NOTE — Progress Notes (Signed)
   Covid-19 Vaccination Clinic  Name:  Andrea Rodgers    MRN: [id number] DOB: [birth year]  04/10/2019  Ms. Andrea Rodgers was observed post Covid-19 immunization for 15 minutes without incidence. She was provided with Vaccine Information Sheet and instruction to access the V-Safe system.   Ms. Andrea Rodgers was instructed to call 911 with any severe reactions post vaccine: Marland Kitchen Difficulty breathing  . Swelling of your face and throat  . A fast heartbeat  . A bad rash all over your body  . Dizziness and weakness    Immunizations Administered    Name Date Dose VIS Date Route   Pfizer COVID-19 Vaccine 04/10/2019 12:37 PM 0.3 mL 01/24/2019 Intramuscular   Manufacturer: [place]   Lot: [id number]   [place]: [id number]

## 2019-04-23 ENCOUNTER — Inpatient Hospital Stay (HOSPITAL_BASED_OUTPATIENT_CLINIC_OR_DEPARTMENT_OTHER): Payer: Medicare HMO | Admitting: Internal Medicine

## 2019-04-23 ENCOUNTER — Inpatient Hospital Stay: Payer: Medicare HMO | Attending: Internal Medicine

## 2019-04-23 VITALS — BP 173/75 | HR 69 | Temp 95.1°F | Wt 129.0 lb

## 2019-04-23 DIAGNOSIS — Z79811 Long term (current) use of aromatase inhibitors: Secondary | ICD-10-CM | POA: Diagnosis not present

## 2019-04-23 DIAGNOSIS — Z8572 Personal history of non-Hodgkin lymphomas: Secondary | ICD-10-CM | POA: Diagnosis not present

## 2019-04-23 DIAGNOSIS — Z9221 Personal history of antineoplastic chemotherapy: Secondary | ICD-10-CM | POA: Diagnosis not present

## 2019-04-23 DIAGNOSIS — I10 Essential (primary) hypertension: Secondary | ICD-10-CM | POA: Insufficient documentation

## 2019-04-23 DIAGNOSIS — Z171 Estrogen receptor negative status [ER-]: Secondary | ICD-10-CM | POA: Insufficient documentation

## 2019-04-23 DIAGNOSIS — Z79899 Other long term (current) drug therapy: Secondary | ICD-10-CM | POA: Diagnosis not present

## 2019-04-23 DIAGNOSIS — C50812 Malignant neoplasm of overlapping sites of left female breast: Secondary | ICD-10-CM | POA: Insufficient documentation

## 2019-04-23 LAB — COMPREHENSIVE METABOLIC PANEL
ALT: 17 U/L (ref 0–44)
AST: 20 U/L (ref 15–41)
Albumin: 3.9 g/dL (ref 3.5–5.0)
Alkaline Phosphatase: 59 U/L (ref 38–126)
Anion gap: 9 (ref 5–15)
BUN: 14 mg/dL (ref 8–23)
CO2: 29 mmol/L (ref 22–32)
Calcium: 9 mg/dL (ref 8.9–10.3)
Chloride: 101 mmol/L (ref 98–111)
Creatinine, Ser: 0.43 mg/dL — ABNORMAL LOW (ref 0.44–1.00)
GFR calc Af Amer: >60 mL/min (ref >60)
GFR calc non Af Amer: >60 mL/min (ref >60)
Glucose, Bld: 95 mg/dL (ref 70–99)
Potassium: 3.6 mmol/L (ref 3.5–5.1)
Sodium: 139 mmol/L (ref 135–145)
Total Bilirubin: 0.5 mg/dL (ref 0.3–1.2)
Total Protein: 7.3 g/dL (ref 6.5–8.1)

## 2019-04-23 LAB — CBC WITH DIFFERENTIAL/PLATELET
Abs Immature Granulocytes: 0 10*3/uL (ref 0.00–0.07)
Basophils Absolute: 0 10*3/uL (ref 0.0–0.1)
Basophils Relative: 0 %
Eosinophils Absolute: 0 10*3/uL (ref 0.0–0.5)
Eosinophils Relative: 0 %
HCT: 36.1 % (ref 36.0–46.0)
Hemoglobin: 11.8 g/dL — ABNORMAL LOW (ref 12.0–15.0)
Immature Granulocytes: 0 %
Lymphocytes Relative: 38 %
Lymphs Abs: 1.8 10*3/uL (ref 0.7–4.0)
MCH: 30.5 pg (ref 26.0–34.0)
MCHC: 32.7 g/dL (ref 30.0–36.0)
MCV: 93.3 fL (ref 80.0–100.0)
Monocytes Absolute: 0.9 10*3/uL (ref 0.1–1.0)
Monocytes Relative: 19 %
Neutro Abs: 2.1 10*3/uL (ref 1.7–7.7)
Neutrophils Relative %: 43 %
Platelets: 227 10*3/uL (ref 150–400)
RBC: 3.87 MIL/uL (ref 3.87–5.11)
RDW: 12.6 % (ref 11.5–15.5)
WBC: 4.8 10*3/uL (ref 4.0–10.5)
nRBC: 0 % (ref 0.0–0.2)

## 2019-04-23 MED ORDER — HEPARIN SOD (PORK) LOCK FLUSH 100 UNIT/ML IV SOLN
INTRAVENOUS | Status: AC
  Filled 2019-04-23: qty 5

## 2019-04-23 MED ORDER — HEPARIN SOD (PORK) LOCK FLUSH 100 UNIT/ML IV SOLN
500.0000 [IU] | Freq: Once | INTRAVENOUS | Status: AC
  Administered 2019-04-23: 500 [IU] via INTRAVENOUS
  Filled 2019-04-23: qty 5

## 2019-04-23 NOTE — Assessment & Plan Note (Signed)
#  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  STABLE.   # Clinically no evidence of recurrence. Continue arimidex for now. 10/2018 mammogram.   # HTN- poorly controlled;systolic [id number].  recommend monitoring BPs at home.    # Elevated tumor marker CA 27-29-March 2020 normal.  STABLE;  await labs from today.  # History of MALT lymphoma of the lung- clinically no evidence of progression noted. Stable.   # Osteoporosis surveillance-she is on fosomax/ vit D;  bone density April 2019 osteopenia. =-1.8; STABLE.   # port flush every 3 months; keep for now.   # DISPOSITION:  # port flush every 3 months # follow up in 6 months-MD/ labs-cbc/cmp/ ca 27-29/port flush-Dr.B.   Cc;Dr.Tate.

## 2019-04-23 NOTE — Progress Notes (Signed)
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  Patient denies any blood in stool black or stools.  Denies any headaches.  No nausea no vomiting.  No bone pain.  Review of Systems  Constitutional: Positive for malaise/fatigue. Negative for chills, diaphoresis and fever.  HENT: Negative for nosebleeds and sore throat.   Eyes: Negative for double vision.  Respiratory: Negative for cough, hemoptysis, sputum production, shortness of breath and wheezing.   Cardiovascular: Negative for chest pain, palpitations, orthopnea  and leg swelling.  Gastrointestinal: Negative for abdominal pain, blood in stool, constipation, diarrhea, heartburn, melena, nausea and vomiting.  Genitourinary: Negative for dysuria, frequency and urgency.  Musculoskeletal: Positive for back pain and joint pain.  Skin: Negative.  Negative for itching and rash.  Neurological: Negative for dizziness, tingling, focal weakness, weakness and headaches.  Endo/Heme/Allergies: Does not bruise/bleed easily.  Psychiatric/Behavioral: Negative for depression. The patient is not nervous/anxious and does not have insomnia.      PAST MEDICAL HISTORY :  Past Medical History:  Diagnosis Date  . Breast cancer ([place]) 2015   left breast cancer  . Breast cancer metastasized to axillary lymph node ([place]) August 2015   T2, N1, ER negative, PR negative, HER-2 amplified. 2 cm axillary node. One of 11 nodes positive on post-adjuvant chemotherapy axillary dissection. 3+ centimeter tumor.  . Hyperlipemia   . Hyperlipidemia   . Hypertension   . 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  Marland Kitchen MALT lymphoma ([place])   . Murmur   . Osteoporosis   . Personal history of chemotherapy     PAST SURGICAL HISTORY :   Past Surgical History:  Procedure Laterality Date  . ABDOMINAL HYSTERECTOMY    . APPENDECTOMY    . BREAST SURGERY Left 02/25/14   Modified radical mastectomy, T2 N1. Grade 3, ER, PR negative, HER-2/neu 3+.  . COLONOSCOPY WITH PROPOFOL N/A 01/26/2016   Procedure: COLONOSCOPY WITH PROPOFOL;  Surgeon: [name], MD;  Location: [facility] ENDOSCOPY;  Service: Endoscopy;  Laterality: N/A;  . MASTECTOMY Left 02-25-14   Dr Bary Castilla    FAMILY HISTORY :   Family History  Problem Relation Age of Onset  . Hypertension Mother   . Hypertension Maternal Aunt   . Hypertension Maternal Uncle   . Hypertension Maternal Aunt   . Hypertension Maternal Aunt   . Breast cancer Neg Hx     SOCIAL HISTORY:   Social History    Tobacco Use  . Smoking status: Never Smoker  . Smokeless tobacco: Never Used  Substance Use Topics  . Alcohol use: No  . Drug use: No    ALLERGIES:  has No Known Allergies.  MEDICATIONS:  Current Outpatient Medications  Medication Sig Dispense Refill  . alendronate (FOSAMAX) 70 MG tablet Take 70 mg by mouth once a week. Take with a full glass of water on an empty stomach.    Marland Kitchen amLODipine (NORVASC) 2.5 MG tablet Take 2.5 mg by mouth daily.    Marland Kitchen anastrozole (ARIMIDEX) 1 MG tablet TAKE ONE TABLET BY MOUTH ONCE DAILY 90 tablet 3  . cholecalciferol (VITAMIN D) 1000 UNITS tablet Take 1,000 Units by mouth daily.    Marland Kitchen losartan-hydrochlorothiazide (HYZAAR) 100-25 MG per tablet Take 1 tablet by mouth daily.    . Multiple Vitamins-Minerals (MULTIVITAMIN WITH MINERALS) tablet Take 1 tablet by mouth daily.    . simvastatin (ZOCOR) 40 MG tablet Take 40 mg by mouth at bedtime.    Marland Kitchen aspirin 81 MG tablet Take 81 mg by mouth daily.    . potassium chloride SA (K-DUR,KLOR-CON) 20 MEQ tablet 1 pill twice a day x1 week; and then one a day (Patient not taking: Reported on 04/22/2019) 60 tablet 0   No current facility-administered medications for this visit.   Facility-Administered Medications Ordered in Other Visits  Medication Dose Route Frequency Provider Last Rate Last Admin  . sodium chloride flush (NS) 0.9 % injection 10 mL  10 mL Intravenous PRN Cammie Sickle, MD   10 mL at 10/19/16 1054  . sodium chloride flush (NS) 0.9 % injection 10 mL  10 mL Intravenous PRN Cammie Sickle, MD   10 mL at 12/13/17 1050    PHYSICAL EXAMINATION: ECOG PERFORMANCE STATUS: 0 - Asymptomatic  BP (!) 173/75   Pulse 69   Temp (!) 95.1 F (35.1 C) (Tympanic)   Wt 129 lb (58.5 kg)   BMI 25.19 kg/m   Filed Weights   04/22/19 1553  Weight: 129 lb (58.5 kg)    Physical Exam  Constitutional: She is oriented to person, place, and time.  Frail-appearing female patient.  She is walking herself.   Alone.  HENT:  Head: Normocephalic and atraumatic.  Mouth/Throat: Oropharynx is clear and moist. No oropharyngeal exudate.  Eyes: Pupils are equal, round, and reactive to light.  Cardiovascular: Normal rate and regular rhythm.  Pulmonary/Chest: No respiratory distress. She has no wheezes.  Abdominal: Soft. Bowel sounds are normal. She exhibits no distension and no mass. There is no abdominal tenderness. There is no rebound and no guarding.  Musculoskeletal:        General: No tenderness or edema. Normal range of motion.     Cervical back: Normal range of motion and neck supple.  Neurological: She is alert and oriented to person, place, and time.  Skin: Skin is warm.  Psychiatric: Affect normal.     LABORATORY DATA:  I have reviewed the data as listed    Component Value Date/Time   NA 139 04/23/2019 0945   NA 136 05/27/2014 0942   K 3.6 04/23/2019 0945   K 3.4 (L) 05/27/2014 0942   CL  101 04/23/2019 0945   CL 102 05/27/2014 0942   CO2 29 04/23/2019 0945   CO2 29 05/27/2014 0942   GLUCOSE 95 04/23/2019 0945   GLUCOSE 106 (H) 05/27/2014 0942   BUN 14 04/23/2019 0945   BUN 13 05/27/2014 0942   CREATININE 0.43 (L) 04/23/2019 0945   CREATININE 0.58 05/27/2014 0942   CALCIUM 9.0 04/23/2019 0945   CALCIUM 8.6 (L) 05/27/2014 0942   PROT 7.3 04/23/2019 0945   PROT 6.8 05/27/2014 0942   ALBUMIN 3.9 04/23/2019 0945   ALBUMIN 3.8 05/27/2014 0942   AST 20 04/23/2019 0945   AST 18 05/27/2014 0942   ALT 17 04/23/2019 0945   ALT 14 05/27/2014 0942   ALKPHOS 59 04/23/2019 0945   ALKPHOS 69 05/27/2014 0942   BILITOT 0.5 04/23/2019 0945   BILITOT 0.6 05/27/2014 0942   GFRNONAA >60 04/23/2019 0945   GFRNONAA >60 05/27/2014 0942   GFRAA >60 04/23/2019 0945   GFRAA >60 05/27/2014 0942    No results found for: SPEP, UPEP  Lab Results  Component Value Date   WBC 4.8 04/23/2019   NEUTROABS 2.1 04/23/2019   HGB 11.8 (L) 04/23/2019   HCT 36.1 04/23/2019   MCV 93.3 04/23/2019   PLT  227 04/23/2019      Chemistry      Component Value Date/Time   NA 139 04/23/2019 0945   NA 136 05/27/2014 0942   K 3.6 04/23/2019 0945   K 3.4 (L) 05/27/2014 0942   CL 101 04/23/2019 0945   CL 102 05/27/2014 0942   CO2 29 04/23/2019 0945   CO2 29 05/27/2014 0942   BUN 14 04/23/2019 0945   BUN 13 05/27/2014 0942   CREATININE 0.43 (L) 04/23/2019 0945   CREATININE 0.58 05/27/2014 0942      Component Value Date/Time   CALCIUM 9.0 04/23/2019 0945   CALCIUM 8.6 (L) 05/27/2014 0942   ALKPHOS 59 04/23/2019 0945   ALKPHOS 69 05/27/2014 0942   AST 20 04/23/2019 0945   AST 18 05/27/2014 0942   ALT 17 04/23/2019 0945   ALT 14 05/27/2014 0942   BILITOT 0.5 04/23/2019 0945   BILITOT 0.6 05/27/2014 0942       ASSESSMENT & PLAN:   Carcinoma of overlapping sites of left breast in female, estrogen receptor negative ([place]) # 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  STABLE.   # Clinically no evidence of recurrence. Continue arimidex for now. 10/2018 mammogram.   # HTN- poorly controlled;systolic [id number].  recommend monitoring BPs at home.    # Elevated tumor marker CA 27-29-March 2020 normal.  STABLE;  await labs from today.  # History of MALT lymphoma of the lung- clinically no evidence of progression noted. Stable.   # Osteoporosis surveillance-she is on fosomax/ vit D;  bone density April 2019 osteopenia. =-1.8; STABLE.   # port flush every 3 months; keep for now.   # DISPOSITION:  # port flush every 3 months # follow up in 6 months-MD/ labs-cbc/cmp/ ca 27-29/port flush-Dr.B.   Cc;Dr.Tate.      Cammie Sickle, MD 04/23/2019 12:56 PM

## 2019-04-24 LAB — CANCER ANTIGEN 27.29: CA 27.29: 39.5 U/mL — ABNORMAL HIGH (ref 0.0–38.6)

## 2019-05-06 ENCOUNTER — Ambulatory Visit: Payer: Medicare HMO | Attending: Internal Medicine

## 2019-05-06 DIAGNOSIS — Z23 Encounter for immunization: Secondary | ICD-10-CM

## 2019-05-06 NOTE — Progress Notes (Signed)
   Covid-19 Vaccination Clinic  Name:  Andrea Rodgers    MRN: [id number] DOB: November 05, 1940  05/06/2019  [name] was observed post Covid-19 immunization for 15 minutes without incident. She was provided with Vaccine Information Sheet and instruction to access the V-Safe system.   [name] was instructed to call 911 with any severe reactions post vaccine: Marland Kitchen Difficulty breathing  . Swelling of face and throat  . A fast heartbeat  . A bad rash all over body  . Dizziness and weakness   Immunizations Administered    Name Date Dose VIS Date Route   Pfizer COVID-19 Vaccine 05/06/2019  1:50 PM 0.3 mL 01/24/2019 Intramuscular   Manufacturer: [place]   Lot: [id number]   [place]: [id number]

## 2019-07-21 ENCOUNTER — Other Ambulatory Visit: Payer: Self-pay | Admitting: *Deleted

## 2019-07-21 DIAGNOSIS — C50912 Malignant neoplasm of unspecified site of left female breast: Secondary | ICD-10-CM

## 2019-07-21 MED ORDER — ANASTROZOLE 1 MG PO TABS: 1.0000 mg | ORAL_TABLET | Freq: Every day | ORAL | 1 refills | Status: DC

## 2019-07-24 ENCOUNTER — Other Ambulatory Visit: Payer: Self-pay

## 2019-07-24 ENCOUNTER — Inpatient Hospital Stay: Payer: Medicare HMO | Attending: Internal Medicine

## 2019-07-24 DIAGNOSIS — Z452 Encounter for adjustment and management of vascular access device: Secondary | ICD-10-CM | POA: Diagnosis not present

## 2019-07-24 DIAGNOSIS — Z171 Estrogen receptor negative status [ER-]: Secondary | ICD-10-CM | POA: Insufficient documentation

## 2019-07-24 DIAGNOSIS — Z79811 Long term (current) use of aromatase inhibitors: Secondary | ICD-10-CM | POA: Diagnosis not present

## 2019-07-24 DIAGNOSIS — C50812 Malignant neoplasm of overlapping sites of left female breast: Secondary | ICD-10-CM | POA: Insufficient documentation

## 2019-07-24 DIAGNOSIS — Z95828 Presence of other vascular implants and grafts: Secondary | ICD-10-CM

## 2019-07-24 DIAGNOSIS — Z9221 Personal history of antineoplastic chemotherapy: Secondary | ICD-10-CM | POA: Diagnosis not present

## 2019-07-24 MED ORDER — SODIUM CHLORIDE 0.9% FLUSH
10.0000 mL | INTRAVENOUS | Status: DC | PRN
  Administered 2019-07-24: 10 mL via INTRAVENOUS
  Filled 2019-07-24: qty 10

## 2019-07-24 MED ORDER — HEPARIN SOD (PORK) LOCK FLUSH 100 UNIT/ML IV SOLN
INTRAVENOUS | Status: AC
  Filled 2019-07-24: qty 5

## 2019-07-24 MED ORDER — HEPARIN SOD (PORK) LOCK FLUSH 100 UNIT/ML IV SOLN
500.0000 [IU] | Freq: Once | INTRAVENOUS | Status: AC
  Administered 2019-07-24: 500 [IU] via INTRAVENOUS
  Filled 2019-07-24: qty 5

## 2019-08-09 ENCOUNTER — Other Ambulatory Visit: Payer: Self-pay | Admitting: Internal Medicine

## 2019-08-09 DIAGNOSIS — C50912 Malignant neoplasm of unspecified site of left female breast: Secondary | ICD-10-CM

## 2019-10-22 ENCOUNTER — Encounter: Payer: Self-pay | Admitting: Internal Medicine

## 2019-10-22 ENCOUNTER — Inpatient Hospital Stay (HOSPITAL_BASED_OUTPATIENT_CLINIC_OR_DEPARTMENT_OTHER): Payer: Medicare HMO | Admitting: Internal Medicine

## 2019-10-22 ENCOUNTER — Other Ambulatory Visit: Payer: Self-pay

## 2019-10-22 ENCOUNTER — Inpatient Hospital Stay: Payer: Medicare HMO | Attending: Internal Medicine

## 2019-10-22 DIAGNOSIS — M8580 Other specified disorders of bone density and structure, unspecified site: Secondary | ICD-10-CM | POA: Diagnosis not present

## 2019-10-22 DIAGNOSIS — Z79899 Other long term (current) drug therapy: Secondary | ICD-10-CM | POA: Diagnosis not present

## 2019-10-22 DIAGNOSIS — Z79811 Long term (current) use of aromatase inhibitors: Secondary | ICD-10-CM | POA: Insufficient documentation

## 2019-10-22 DIAGNOSIS — Z8579 Personal history of other malignant neoplasms of lymphoid, hematopoietic and related tissues: Secondary | ICD-10-CM | POA: Insufficient documentation

## 2019-10-22 DIAGNOSIS — Z171 Estrogen receptor negative status [ER-]: Secondary | ICD-10-CM

## 2019-10-22 DIAGNOSIS — Z9221 Personal history of antineoplastic chemotherapy: Secondary | ICD-10-CM | POA: Diagnosis not present

## 2019-10-22 DIAGNOSIS — C50812 Malignant neoplasm of overlapping sites of left female breast: Secondary | ICD-10-CM

## 2019-10-22 LAB — COMPREHENSIVE METABOLIC PANEL
ALT: 15 U/L (ref 0–44)
AST: 18 U/L (ref 15–41)
Albumin: 4.2 g/dL (ref 3.5–5.0)
Alkaline Phosphatase: 65 U/L (ref 38–126)
Anion gap: 13 (ref 5–15)
BUN: 14 mg/dL (ref 8–23)
CO2: 27 mmol/L (ref 22–32)
Calcium: 9.2 mg/dL (ref 8.9–10.3)
Chloride: 100 mmol/L (ref 98–111)
Creatinine, Ser: 0.59 mg/dL (ref 0.44–1.00)
GFR calc Af Amer: >60 mL/min (ref >60)
GFR calc non Af Amer: >60 mL/min (ref >60)
Glucose, Bld: 95 mg/dL (ref 70–99)
Potassium: 3.3 mmol/L — ABNORMAL LOW (ref 3.5–5.1)
Sodium: 140 mmol/L (ref 135–145)
Total Bilirubin: 0.5 mg/dL (ref 0.3–1.2)
Total Protein: 8 g/dL (ref 6.5–8.1)

## 2019-10-22 LAB — CBC WITH DIFFERENTIAL/PLATELET
Abs Immature Granulocytes: 0.02 10*3/uL (ref 0.00–0.07)
Basophils Absolute: 0 10*3/uL (ref 0.0–0.1)
Basophils Relative: 0 %
Eosinophils Absolute: 0.1 10*3/uL (ref 0.0–0.5)
Eosinophils Relative: 1 %
HCT: 35.8 % — ABNORMAL LOW (ref 36.0–46.0)
Hemoglobin: 12.3 g/dL (ref 12.0–15.0)
Immature Granulocytes: 0 %
Lymphocytes Relative: 34 %
Lymphs Abs: 2.1 10*3/uL (ref 0.7–4.0)
MCH: 31.1 pg (ref 26.0–34.0)
MCHC: 34.4 g/dL (ref 30.0–36.0)
MCV: 90.6 fL (ref 80.0–100.0)
Monocytes Absolute: 1.1 10*3/uL — ABNORMAL HIGH (ref 0.1–1.0)
Monocytes Relative: 18 %
Neutro Abs: 2.8 10*3/uL (ref 1.7–7.7)
Neutrophils Relative %: 47 %
Platelets: 230 10*3/uL (ref 150–400)
RBC: 3.95 MIL/uL (ref 3.87–5.11)
RDW: 12.6 % (ref 11.5–15.5)
WBC: 6.1 10*3/uL (ref 4.0–10.5)
nRBC: 0 % (ref 0.0–0.2)

## 2019-10-22 MED ORDER — HEPARIN SOD (PORK) LOCK FLUSH 100 UNIT/ML IV SOLN
500.0000 [IU] | Freq: Once | INTRAVENOUS | Status: AC
  Administered 2019-10-22: 500 [IU] via INTRAVENOUS
  Filled 2019-10-22: qty 5

## 2019-10-22 MED ORDER — HEPARIN SOD (PORK) LOCK FLUSH 100 UNIT/ML IV SOLN
INTRAVENOUS | Status: AC
  Filled 2019-10-22: qty 5

## 2019-10-22 MED ORDER — SODIUM CHLORIDE 0.9% FLUSH
10.0000 mL | Freq: Once | INTRAVENOUS | Status: AC
  Administered 2019-10-22: 10 mL via INTRAVENOUS
  Filled 2019-10-22: qty 10

## 2019-10-22 NOTE — Progress Notes (Signed)
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  Patient denies any worsening joint pains or back pain.  She continues to have chronic back pain.  No nausea no vomiting pain no bone pain.  No headaches.  No worsening shortness of breath or cough.  Review of Systems  Constitutional: Positive for malaise/fatigue. Negative for chills, diaphoresis and fever.  HENT: Negative for nosebleeds and sore throat.   Eyes: Negative for double vision.   Respiratory: Negative for cough, hemoptysis, sputum production, shortness of breath and wheezing.   Cardiovascular: Negative for chest pain, palpitations, orthopnea and leg swelling.  Gastrointestinal: Negative for abdominal pain, blood in stool, constipation, diarrhea, heartburn, melena, nausea and vomiting.  Genitourinary: Negative for dysuria, frequency and urgency.  Musculoskeletal: Positive for back pain and joint pain.  Skin: Negative.  Negative for itching and rash.  Neurological: Negative for dizziness, tingling, focal weakness, weakness and headaches.  Endo/Heme/Allergies: Does not bruise/bleed easily.  Psychiatric/Behavioral: Negative for depression. The patient is not nervous/anxious and does not have insomnia.      PAST MEDICAL HISTORY :  Past Medical History:  Diagnosis Date  . Breast cancer ([place]) 2015   left breast cancer  . Breast cancer metastasized to axillary lymph node ([place]) August 2015   T2, N1, ER negative, PR negative, HER-2 amplified. 2 cm axillary node. One of 11 nodes positive on post-adjuvant chemotherapy axillary dissection. 3+ centimeter tumor.  . Hyperlipemia   . Hyperlipidemia   . Hypertension   . 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  Marland Kitchen MALT lymphoma ([place])   . Murmur   . Osteoporosis   . Personal history of chemotherapy     PAST SURGICAL HISTORY :   Past Surgical History:  Procedure Laterality Date  . ABDOMINAL HYSTERECTOMY    . APPENDECTOMY    . BREAST SURGERY Left 02/25/14   Modified radical mastectomy, T2 N1. Grade 3, ER, PR negative, HER-2/neu 3+.  . COLONOSCOPY WITH PROPOFOL N/A 01/26/2016   Procedure: COLONOSCOPY WITH PROPOFOL;  Surgeon: [name], MD;  Location: [facility] ENDOSCOPY;  Service: Endoscopy;  Laterality: N/A;  . MASTECTOMY Left 02-25-14   Dr Bary Castilla    FAMILY HISTORY :   Family History  Problem Relation Age of Onset  . Hypertension Mother   . Hypertension Maternal  Aunt   . Hypertension Maternal Uncle   . Hypertension Maternal Aunt   . Hypertension Maternal Aunt   . Breast cancer Neg Hx     SOCIAL HISTORY:   Social History   Tobacco Use  . Smoking status: Never Smoker  . Smokeless tobacco: Never Used  Substance Use Topics  . Alcohol use: No  . Drug use: No    ALLERGIES:  has No Known Allergies.  MEDICATIONS:  Current Outpatient Medications  Medication Sig Dispense Refill  . alendronate (FOSAMAX) 70 MG tablet Take 70 mg by mouth once a week. Take with a full glass of water on an empty stomach.    Marland Kitchen amLODipine (NORVASC) 2.5 MG tablet Take 2.5 mg by mouth daily.    Marland Kitchen anastrozole (ARIMIDEX) 1 MG tablet TAKE ONE TABLET BY MOUTH ONCE DAILY 90 tablet 3  . aspirin 81 MG tablet Take 81 mg by mouth daily.    . cholecalciferol (VITAMIN D) 1000 UNITS tablet Take 1,000 Units by mouth daily.    Marland Kitchen losartan-hydrochlorothiazide (HYZAAR) 100-25 MG per tablet Take 1 tablet by mouth daily.    . Multiple Vitamins-Minerals (MULTIVITAMIN WITH MINERALS) tablet Take 1 tablet by mouth daily.    . simvastatin (ZOCOR) 40 MG tablet Take 40 mg by mouth at bedtime.    . potassium chloride SA (K-DUR,KLOR-CON) 20 MEQ tablet 1 pill twice a day x1 week; and then one a day (Patient not taking: Reported on 04/22/2019) 60 tablet 0   No current facility-administered medications for this visit.   Facility-Administered Medications Ordered in Other Visits  Medication Dose Route Frequency Provider Last Rate Last Admin  . sodium chloride flush (NS) 0.9 % injection 10 mL  10 mL Intravenous PRN Cammie Sickle, MD   10 mL at 10/19/16 1054  . sodium chloride flush (NS) 0.9 % injection 10 mL  10 mL Intravenous PRN Cammie Sickle, MD   10 mL at 12/13/17 1050    PHYSICAL EXAMINATION: ECOG PERFORMANCE STATUS: 0 - Asymptomatic  BP (!) 176/78 (BP Location: Right Arm, Patient Position: Sitting, Cuff Size: Normal)   Pulse 74   Temp (!) 95.7 F (35.4 C) (Tympanic)   Resp 16    Ht 5' (1.524 m)   Wt 132 lb 12.8 oz (60.2 kg)   SpO2 100%   BMI 25.94 kg/m   Filed Weights   10/22/19 1500  Weight: 132 lb 12.8 oz (60.2 kg)    Physical Exam Constitutional:      Comments: Frail-appearing female patient.  She is walking herself.  Alone.  HENT:     Head: Normocephalic and atraumatic.     Mouth/Throat:     Pharynx: No oropharyngeal exudate.  Eyes:     Pupils: Pupils are equal, round, and reactive to light.  Cardiovascular:     Rate and Rhythm: Normal rate and regular rhythm.  Pulmonary:     Effort: No respiratory distress.     Breath sounds: No wheezing.  Abdominal:     General: Bowel sounds are normal. There is no distension.     Palpations: Abdomen is soft. There is no mass.     Tenderness: There is no abdominal tenderness. There is no guarding or rebound.  Musculoskeletal:  General: No tenderness. Normal range of motion.     Cervical back: Normal range of motion and neck supple.  Skin:    General: Skin is warm.  Neurological:     Mental Status: She is alert and oriented to person, place, and time.  Psychiatric:        Mood and Affect: Affect normal.      LABORATORY DATA:  I have reviewed the data as listed    Component Value Date/Time   NA 140 10/22/2019 1457   NA 136 05/27/2014 0942   K 3.3 (L) 10/22/2019 1457   K 3.4 (L) 05/27/2014 0942   CL 100 10/22/2019 1457   CL 102 05/27/2014 0942   CO2 27 10/22/2019 1457   CO2 29 05/27/2014 0942   GLUCOSE 95 10/22/2019 1457   GLUCOSE 106 (H) 05/27/2014 0942   BUN 14 10/22/2019 1457   BUN 13 05/27/2014 0942   CREATININE 0.59 10/22/2019 1457   CREATININE 0.58 05/27/2014 0942   CALCIUM 9.2 10/22/2019 1457   CALCIUM 8.6 (L) 05/27/2014 0942   PROT 8.0 10/22/2019 1457   PROT 6.8 05/27/2014 0942   ALBUMIN 4.2 10/22/2019 1457   ALBUMIN 3.8 05/27/2014 0942   AST 18 10/22/2019 1457   AST 18 05/27/2014 0942   ALT 15 10/22/2019 1457   ALT 14 05/27/2014 0942   ALKPHOS 65 10/22/2019 1457    ALKPHOS 69 05/27/2014 0942   BILITOT 0.5 10/22/2019 1457   BILITOT 0.6 05/27/2014 0942   GFRNONAA >60 10/22/2019 1457   GFRNONAA >60 05/27/2014 0942   GFRAA >60 10/22/2019 1457   GFRAA >60 05/27/2014 0942    No results found for: SPEP, UPEP  Lab Results  Component Value Date   WBC 6.1 10/22/2019   NEUTROABS 2.8 10/22/2019   HGB 12.3 10/22/2019   HCT 35.8 (L) 10/22/2019   MCV 90.6 10/22/2019   PLT 230 10/22/2019      Chemistry      Component Value Date/Time   NA 140 10/22/2019 1457   NA 136 05/27/2014 0942   K 3.3 (L) 10/22/2019 1457   K 3.4 (L) 05/27/2014 0942   CL 100 10/22/2019 1457   CL 102 05/27/2014 0942   CO2 27 10/22/2019 1457   CO2 29 05/27/2014 0942   BUN 14 10/22/2019 1457   BUN 13 05/27/2014 0942   CREATININE 0.59 10/22/2019 1457   CREATININE 0.58 05/27/2014 0942      Component Value Date/Time   CALCIUM 9.2 10/22/2019 1457   CALCIUM 8.6 (L) 05/27/2014 0942   ALKPHOS 65 10/22/2019 1457   ALKPHOS 69 05/27/2014 0942   AST 18 10/22/2019 1457   AST 18 05/27/2014 0942   ALT 15 10/22/2019 1457   ALT 14 05/27/2014 0942   BILITOT 0.5 10/22/2019 1457   BILITOT 0.6 05/27/2014 0942       ASSESSMENT & PLAN:   Carcinoma of overlapping sites of left breast in female, estrogen receptor negative ([facility]) # 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  Stable.  # Clinically no evidence of recurrence. Continue arimidex for now. 10/2018 mammogram [Dr.Tate]  # HTN- poorly controlled;systolic BP-170s.  recommend monitoring BPs at home.    # Elevated tumor marker CA 27-29-March 2020 normal.  STABLE;  await labs from today.  # History of MALT lymphoma of the lung- clinically no evidence of progression noted. STABLE  # Osteoporosis surveillance-she is on fosomax/ vit D;  bone density April  2019 osteopenia. =-1.8; STABLE.   # port flush every 3 months; keep for now.   # COVID BOOSTER:  Discussed given patient's diagnosis and other comorbidities/therapies-patient would be considered immunocompromised.  As per CDC recommendation/FDA approval-I would recommend booster vaccine.  Patient is interested.   # DISPOSITION:  # port flush every 3 months # follow up in 6 months-MD/ labs-cbc/cmp/ ca 27-29/port flush-Dr.B.   Cc;Dr.Tate.      Cammie Sickle, MD 10/22/2019 4:38 PM

## 2019-10-22 NOTE — Assessment & Plan Note (Addendum)
#  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  Stable.  # Clinically no evidence of recurrence. Continue arimidex for now. 10/2018 mammogram [Dr.Tate]  # HTN- poorly controlled;systolic [id number].  recommend monitoring BPs at home.    # Elevated tumor marker CA 27-29-March 2020 normal.  STABLE;  await labs from today.  # History of MALT lymphoma of the lung- clinically no evidence of progression noted. STABLE  # Osteoporosis surveillance-she is on fosomax/ vit D;  bone density April 2019 osteopenia. =-1.8; STABLE.   # port flush every 3 months; keep for now.   # COVID BOOSTER: Discussed given patient's diagnosis and other comorbidities/therapies-patient would be considered immunocompromised.  As per CDC recommendation/FDA approval-I would recommend booster vaccine.  Patient is interested.   # DISPOSITION:  # port flush every 3 months # follow up in 6 months-MD/ labs-cbc/cmp/ ca 27-29/port flush-Dr.B.   Addendum: On 9/10-spoke to daughter regarding elevated tumor marker; recommend PET scan for further evaluation.  Will call with results.  Also discussed with Dr. Hall Busing regarding elevated blood pressures.  Cc;Dr.Tate.

## 2019-10-23 LAB — CANCER ANTIGEN 27.29: CA 27.29: 41.1 U/mL — ABNORMAL HIGH (ref 0.0–38.6)

## 2019-10-27 ENCOUNTER — Telehealth: Payer: Self-pay | Admitting: *Deleted

## 2019-10-27 ENCOUNTER — Telehealth: Payer: Self-pay | Admitting: Internal Medicine

## 2019-10-27 DIAGNOSIS — R978 Other abnormal tumor markers: Secondary | ICD-10-CM

## 2019-10-27 DIAGNOSIS — Z171 Estrogen receptor negative status [ER-]: Secondary | ICD-10-CM

## 2019-10-27 NOTE — Telephone Encounter (Signed)
Per Margreta Journey - PA submitted, and pending.

## 2019-10-27 NOTE — Telephone Encounter (Signed)
On 9/10-spoke to daughter regarding elevated tumor marker; recommend PET scan for further evaluation.  Will call with results.  S-please schedule PET scan in 1 week.

## 2019-10-27 NOTE — Telephone Encounter (Signed)
Christina Please advise.

## 2019-10-27 NOTE — Telephone Encounter (Signed)
Andrea Rodgers - please arrange for pet scan.

## 2019-10-27 NOTE — Telephone Encounter (Signed)
[employer] Case review needs chart information to justify the STAT MRI that was ordered for this patient. [place] at [phone]

## 2019-10-28 ENCOUNTER — Telehealth: Payer: Self-pay | Admitting: Internal Medicine

## 2019-10-28 NOTE — Telephone Encounter (Signed)
No MRI was ordered. Only  a pet scan and Margreta Journey has already submitted the clinical for the PA

## 2019-10-28 NOTE — Telephone Encounter (Signed)
10/28/2019  Spoke with pt and daughter and confirmed PET scan per Dr. B request due to elevated tumor markers in recent labs, for 9/21 @ 8:30. Informed them to arrive by 8 am to medical mall entrance, and to be NPO 6 hrs prior.  SRW

## 2019-10-28 NOTE — Telephone Encounter (Signed)
Daughter made aware of pet scan time/date. Daughter also provided tumor marker numbers. Reassurance provided to patient/patient's daughter that md wants to rule out any evidence of disease reoccurrence given the elevated tumor markers. Active listening provided.

## 2019-11-04 ENCOUNTER — Ambulatory Visit
Admission: RE | Admit: 2019-11-04 | Discharge: 2019-11-04 | Disposition: A | Discharge status: Home / Self Care | Payer: Medicare HMO | Source: Ambulatory Visit | Attending: Internal Medicine | Admitting: Internal Medicine

## 2019-11-04 ENCOUNTER — Other Ambulatory Visit: Payer: Self-pay

## 2019-11-04 DIAGNOSIS — C50812 Malignant neoplasm of overlapping sites of left female breast: Secondary | ICD-10-CM | POA: Diagnosis not present

## 2019-11-04 DIAGNOSIS — I700 Atherosclerosis of aorta: Secondary | ICD-10-CM | POA: Diagnosis not present

## 2019-11-04 DIAGNOSIS — Z171 Estrogen receptor negative status [ER-]: Secondary | ICD-10-CM | POA: Insufficient documentation

## 2019-11-04 DIAGNOSIS — R978 Other abnormal tumor markers: Secondary | ICD-10-CM | POA: Insufficient documentation

## 2019-11-04 LAB — GLUCOSE, CAPILLARY: Glucose-Capillary: 85 mg/dL (ref 70–99)

## 2019-11-04 MED ORDER — FLUDEOXYGLUCOSE F - 18 (FDG) INJECTION
6.8000 | Freq: Once | INTRAVENOUS | Status: AC | PRN
  Administered 2019-11-04: 7.4 via INTRAVENOUS

## 2019-11-05 ENCOUNTER — Telehealth: Payer: Self-pay | Admitting: Internal Medicine

## 2019-11-05 DIAGNOSIS — C50812 Malignant neoplasm of overlapping sites of left female breast: Secondary | ICD-10-CM

## 2019-11-05 DIAGNOSIS — R599 Enlarged lymph nodes, unspecified: Secondary | ICD-10-CM

## 2019-11-05 NOTE — Telephone Encounter (Signed)
On 9/21-spoke to patient daughter regarding the results of the PET scan; subpectoral possible lymph node; not significantly PET avid.  Recommend ultrasound follow-up.  C-schedule follow-up in 2 months; MD; port flush labs- please order CA 27-29; [place] chest 1-2 days prior.   Cancel dec-port flush appt.  Thanks GB

## 2019-11-06 NOTE — Addendum Note (Signed)
Addended by: Delice Bison E on: 11/06/2019 08:17 AM   Modules accepted: Orders

## 2019-11-06 NOTE — Telephone Encounter (Signed)
Lab ordered.

## 2019-11-06 NOTE — Telephone Encounter (Signed)
Colette - please schedule patient in 2 months for lab/md port flush

## 2019-12-16 ENCOUNTER — Other Ambulatory Visit: Payer: Self-pay | Admitting: General Surgery

## 2019-12-16 DIAGNOSIS — Z171 Estrogen receptor negative status [ER-]: Secondary | ICD-10-CM

## 2019-12-16 DIAGNOSIS — C50812 Malignant neoplasm of overlapping sites of left female breast: Secondary | ICD-10-CM

## 2019-12-31 ENCOUNTER — Other Ambulatory Visit: Payer: Self-pay

## 2019-12-31 ENCOUNTER — Encounter: Payer: Self-pay | Admitting: Internal Medicine

## 2019-12-31 ENCOUNTER — Inpatient Hospital Stay: Payer: Medicare HMO | Attending: Internal Medicine

## 2019-12-31 ENCOUNTER — Inpatient Hospital Stay (HOSPITAL_BASED_OUTPATIENT_CLINIC_OR_DEPARTMENT_OTHER): Payer: Medicare HMO | Admitting: Internal Medicine

## 2019-12-31 DIAGNOSIS — Z95828 Presence of other vascular implants and grafts: Secondary | ICD-10-CM

## 2019-12-31 DIAGNOSIS — Z8572 Personal history of non-Hodgkin lymphomas: Secondary | ICD-10-CM | POA: Insufficient documentation

## 2019-12-31 DIAGNOSIS — I10 Essential (primary) hypertension: Secondary | ICD-10-CM | POA: Diagnosis not present

## 2019-12-31 DIAGNOSIS — Z171 Estrogen receptor negative status [ER-]: Secondary | ICD-10-CM

## 2019-12-31 DIAGNOSIS — M810 Age-related osteoporosis without current pathological fracture: Secondary | ICD-10-CM | POA: Diagnosis not present

## 2019-12-31 DIAGNOSIS — C50812 Malignant neoplasm of overlapping sites of left female breast: Secondary | ICD-10-CM | POA: Diagnosis not present

## 2019-12-31 LAB — CBC WITH DIFFERENTIAL/PLATELET
Abs Immature Granulocytes: 0.01 10*3/uL (ref 0.00–0.07)
Basophils Absolute: 0 10*3/uL (ref 0.0–0.1)
Basophils Relative: 0 %
Eosinophils Absolute: 0 10*3/uL (ref 0.0–0.5)
Eosinophils Relative: 0 %
HCT: 35.1 % — ABNORMAL LOW (ref 36.0–46.0)
Hemoglobin: 12.1 g/dL (ref 12.0–15.0)
Immature Granulocytes: 0 %
Lymphocytes Relative: 33 %
Lymphs Abs: 1.8 10*3/uL (ref 0.7–4.0)
MCH: 31.5 pg (ref 26.0–34.0)
MCHC: 34.5 g/dL (ref 30.0–36.0)
MCV: 91.4 fL (ref 80.0–100.0)
Monocytes Absolute: 0.9 10*3/uL (ref 0.1–1.0)
Monocytes Relative: 17 %
Neutro Abs: 2.6 10*3/uL (ref 1.7–7.7)
Neutrophils Relative %: 50 %
Platelets: 228 10*3/uL (ref 150–400)
RBC: 3.84 MIL/uL — ABNORMAL LOW (ref 3.87–5.11)
RDW: 12.7 % (ref 11.5–15.5)
WBC: 5.3 10*3/uL (ref 4.0–10.5)
nRBC: 0 % (ref 0.0–0.2)

## 2019-12-31 LAB — COMPREHENSIVE METABOLIC PANEL
ALT: 15 U/L (ref 0–44)
AST: 21 U/L (ref 15–41)
Albumin: 4.1 g/dL (ref 3.5–5.0)
Alkaline Phosphatase: 57 U/L (ref 38–126)
Anion gap: 10 (ref 5–15)
BUN: 15 mg/dL (ref 8–23)
CO2: 28 mmol/L (ref 22–32)
Calcium: 9 mg/dL (ref 8.9–10.3)
Chloride: 100 mmol/L (ref 98–111)
Creatinine, Ser: 0.61 mg/dL (ref 0.44–1.00)
GFR, Estimated: >60 mL/min (ref >60)
Glucose, Bld: 109 mg/dL — ABNORMAL HIGH (ref 70–99)
Potassium: 3.5 mmol/L (ref 3.5–5.1)
Sodium: 138 mmol/L (ref 135–145)
Total Bilirubin: 0.5 mg/dL (ref 0.3–1.2)
Total Protein: 7.6 g/dL (ref 6.5–8.1)

## 2019-12-31 MED ORDER — HEPARIN SOD (PORK) LOCK FLUSH 100 UNIT/ML IV SOLN
INTRAVENOUS | Status: AC
  Filled 2019-12-31: qty 5

## 2019-12-31 MED ORDER — SODIUM CHLORIDE 0.9% FLUSH
10.0000 mL | INTRAVENOUS | Status: DC | PRN
  Administered 2019-12-31: 10 mL via INTRAVENOUS
  Filled 2019-12-31: qty 10

## 2019-12-31 MED ORDER — HEPARIN SOD (PORK) LOCK FLUSH 100 UNIT/ML IV SOLN
500.0000 [IU] | Freq: Once | INTRAVENOUS | Status: AC
  Administered 2019-12-31: 500 [IU] via INTRAVENOUS
  Filled 2019-12-31: qty 5

## 2019-12-31 NOTE — Assessment & Plan Note (Addendum)
#  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 On anastrazole.  Elevated Tumor maker led to SEP 2021- Small RIGHT subpectoral lymph node with ovoid morphology, activity only slightly greater than baseline mediastinal blood pool is of uncertain significance. ? COVID vaccine.  Question recurrence-see below  #Await tumor marker from today.  If continues to be elevated or rising would recommend further work-up including imaging/biopsy.  Will discuss with daughter.  For now continue anastrozole.  # HTN- poorly controlled;systolic MS-111B.  Stable.  Recommend monitoring BPs at home.    # Elevated tumor marker CA 27-29-SEP 44; elevated; see PET scan above; await tumor markers today.  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   # Osteoporosis surveillance-she is on fosomax/ vit D;  bone density April 2019 osteopenia. =-1.8; STABLE.   # port flush every 3 months; keep for now.   # COVID BOOSTER: Discussed given patient's diagnosis and other comorbidities/therapies-patient would be considered immunocompromised.  As per CDC recommendation/FDA approval-I would recommend booster vaccine.  Patient is interested.   # DISPOSITION: will call daughter- re: furtherwork up.  # follow up in 3 months-MD/ labs-cbc/cmp/ ca 27-29/port flush-Dr.B.   Cc;Dr.Tate.

## 2019-12-31 NOTE — Progress Notes (Signed)
[place] OFFICE PROGRESS NOTE  Patient Care Team: Albina Billet, MD as PCP - General (Internal Medicine) Bary Castilla, Forest Gleason, MD as Consulting Physician (General Surgery) Albina Billet, MD (Internal Medicine) Cammie Sickle, MD as Consulting Physician (Hematology and Oncology)   SUMMARY OF ONCOLOGIC HISTORY:  Oncology History Overview Note  # AUG 2015- LEFT BREAST  STAGE IIB [pT2pN1a]ER-NEG; PR- 1-10%; Her 2 NEU POS; Started neo-adj chemo- AC x4 [lung-MALT lymphoma]; Min response Breast tumor- Lumpec & ALND- T= 3.2cm; N= 1/11LN. 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 STABLE band like opacity in RUL/RML MAY 2017 CT-NED  # SEP21st 2016- MUGA 61 %; DEC 22nd 2016- 65%  # LEFT UE LYMPHEDEMA s/p PT ----------------------------------------------------    DIAGNOSIS: LEFT BREAST CA  STAGE:  II ;GOALS: curative  CURRENT/MOST RECENT THERAPY: arimidex    Mucosa-associated lymphoid tissue (MALT) lymphoma ([place])  04/21/2015 Initial Diagnosis   Mucosa-associated lymphoid tissue (MALT) lymphoma ([facility])   Carcinoma of overlapping sites of left breast in female, estrogen receptor negative ([place])  09/23/2015 Initial Diagnosis   Cancer of overlapping sites of left female breast ([place])      INTERVAL HISTORY:  A pleasant 79 year-old female patient with above history of stage 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  Patient denies any unusual shortness of breath or cough he denies any pain.  Chronic joint pains back pain not any worse.  Denies any new lumps.  Review of Systems  Constitutional: Positive for malaise/fatigue. Negative for chills, diaphoresis and fever.  HENT: Negative for nosebleeds and sore throat.   Eyes: Negative for double vision.  Respiratory: Negative for cough, hemoptysis, sputum  production, shortness of breath and wheezing.   Cardiovascular: Negative for chest pain, palpitations, orthopnea and leg swelling.  Gastrointestinal: Negative for abdominal pain, blood in stool, constipation, diarrhea, heartburn, melena, nausea and vomiting.  Genitourinary: Negative for dysuria, frequency and urgency.  Musculoskeletal: Positive for back pain and joint pain.  Skin: Negative.  Negative for itching and rash.  Neurological: Negative for dizziness, tingling, focal weakness, weakness and headaches.  Endo/Heme/Allergies: Does not bruise/bleed easily.  Psychiatric/Behavioral: Negative for depression. The patient is not nervous/anxious and does not have insomnia.      PAST MEDICAL HISTORY :  Past Medical History:  Diagnosis Date  . Breast cancer ([place]) 2015   left breast cancer  . Breast cancer metastasized to axillary lymph node ([place]) August 2015   T2, N1, ER negative, PR negative, HER-2 amplified. 2 cm axillary node. One of 11 nodes positive on post-adjuvant chemotherapy axillary dissection. 3+ centimeter tumor.  . Hyperlipemia   . Hyperlipidemia   . Hypertension   . 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  Marland Kitchen MALT lymphoma ([place])   . Murmur   . Osteoporosis   . Personal history of chemotherapy     PAST SURGICAL HISTORY :   Past Surgical History:  Procedure Laterality Date  . ABDOMINAL HYSTERECTOMY    . APPENDECTOMY    . BREAST SURGERY Left 02/25/14   Modified radical mastectomy, T2 N1. Grade 3, ER, PR negative, HER-2/neu 3+.  . COLONOSCOPY WITH PROPOFOL N/A 01/26/2016   Procedure: COLONOSCOPY WITH PROPOFOL;  Surgeon: [name], MD;  Location: [facility] ENDOSCOPY;  Service: Endoscopy;  Laterality: N/A;  . MASTECTOMY Left  02-25-14   Dr Bary Castilla    FAMILY HISTORY :   Family History  Problem Relation Age of Onset  . Hypertension Mother   . Hypertension Maternal Aunt   . Hypertension Maternal Uncle   .  Hypertension Maternal Aunt   . Hypertension Maternal Aunt   . Breast cancer Neg Hx     SOCIAL HISTORY:   Social History   Tobacco Use  . Smoking status: Never Smoker  . Smokeless tobacco: Never Used  Substance Use Topics  . Alcohol use: No  . Drug use: No    ALLERGIES:  has No Known Allergies.  MEDICATIONS:  Current Outpatient Medications  Medication Sig Dispense Refill  . alendronate (FOSAMAX) 70 MG tablet Take 70 mg by mouth once a week. Take with a full glass of water on an empty stomach.    Marland Kitchen amLODipine (NORVASC) 2.5 MG tablet Take 2.5 mg by mouth daily.    Marland Kitchen anastrozole (ARIMIDEX) 1 MG tablet TAKE ONE TABLET BY MOUTH ONCE DAILY 90 tablet 3  . aspirin 81 MG tablet Take 81 mg by mouth daily.    . cholecalciferol (VITAMIN D) 1000 UNITS tablet Take 1,000 Units by mouth daily.    Marland Kitchen losartan-hydrochlorothiazide (HYZAAR) 100-25 MG per tablet Take 1 tablet by mouth daily.    . Multiple Vitamins-Minerals (MULTIVITAMIN WITH MINERALS) tablet Take 1 tablet by mouth daily.    . simvastatin (ZOCOR) 40 MG tablet Take 40 mg by mouth at bedtime.     No current facility-administered medications for this visit.   Facility-Administered Medications Ordered in Other Visits  Medication Dose Route Frequency Provider Last Rate Last Admin  . sodium chloride flush (NS) 0.9 % injection 10 mL  10 mL Intravenous PRN Cammie Sickle, MD   10 mL at 10/19/16 1054  . sodium chloride flush (NS) 0.9 % injection 10 mL  10 mL Intravenous PRN Cammie Sickle, MD   10 mL at 12/13/17 1050  . sodium chloride flush (NS) 0.9 % injection 10 mL  10 mL Intravenous PRN Cammie Sickle, MD   10 mL at 12/31/19 1059    PHYSICAL EXAMINATION: ECOG PERFORMANCE STATUS: 0 - Asymptomatic  BP (!) 150/65 (BP Location: Right Arm, Patient Position: Sitting, Cuff Size: Normal)   Pulse 82   Temp 97.7 F (36.5 C) (Tympanic)   Resp 16   Ht 5' (1.524 m)   Wt 133 lb (60.3 kg)   SpO2 100%   BMI 25.97 kg/m    Filed Weights   12/31/19 1127  Weight: 133 lb (60.3 kg)    Physical Exam Constitutional:      Comments: Frail-appearing female patient.  She is walking herself.  Alone.  HENT:     Head: Normocephalic and atraumatic.     Mouth/Throat:     Pharynx: No oropharyngeal exudate.  Eyes:     Pupils: Pupils are equal, round, and reactive to light.  Cardiovascular:     Rate and Rhythm: Normal rate and regular rhythm.  Pulmonary:     Effort: No respiratory distress.     Breath sounds: No wheezing.  Abdominal:     General: Bowel sounds are normal. There is no distension.     Palpations: Abdomen is soft. There is no mass.     Tenderness: There is no abdominal tenderness. There is no guarding or rebound.  Musculoskeletal:        General: No tenderness. Normal range of motion.     Cervical back: Normal range  of motion and neck supple.  Skin:    General: Skin is warm.  Neurological:     Mental Status: She is alert and oriented to person, place, and time.  Psychiatric:        Mood and Affect: Affect normal.      LABORATORY DATA:  I have reviewed the data as listed    Component Value Date/Time   NA 138 12/31/2019 1059   NA 136 05/27/2014 0942   K 3.5 12/31/2019 1059   K 3.4 (L) 05/27/2014 0942   CL 100 12/31/2019 1059   CL 102 05/27/2014 0942   CO2 28 12/31/2019 1059   CO2 29 05/27/2014 0942   GLUCOSE 109 (H) 12/31/2019 1059   GLUCOSE 106 (H) 05/27/2014 0942   BUN 15 12/31/2019 1059   BUN 13 05/27/2014 0942   CREATININE 0.61 12/31/2019 1059   CREATININE 0.58 05/27/2014 0942   CALCIUM 9.0 12/31/2019 1059   CALCIUM 8.6 (L) 05/27/2014 0942   PROT 7.6 12/31/2019 1059   PROT 6.8 05/27/2014 0942   ALBUMIN 4.1 12/31/2019 1059   ALBUMIN 3.8 05/27/2014 0942   AST 21 12/31/2019 1059   AST 18 05/27/2014 0942   ALT 15 12/31/2019 1059   ALT 14 05/27/2014 0942   ALKPHOS 57 12/31/2019 1059   ALKPHOS 69 05/27/2014 0942   BILITOT 0.5 12/31/2019 1059   BILITOT 0.6 05/27/2014 0942    GFRNONAA >60 12/31/2019 1059   GFRNONAA >60 05/27/2014 0942   GFRAA >60 10/22/2019 1457   GFRAA >60 05/27/2014 0942    No results found for: SPEP, UPEP  Lab Results  Component Value Date   WBC 5.3 12/31/2019   NEUTROABS 2.6 12/31/2019   HGB 12.1 12/31/2019   HCT 35.1 (L) 12/31/2019   MCV 91.4 12/31/2019   PLT 228 12/31/2019      Chemistry      Component Value Date/Time   NA 138 12/31/2019 1059   NA 136 05/27/2014 0942   K 3.5 12/31/2019 1059   K 3.4 (L) 05/27/2014 0942   CL 100 12/31/2019 1059   CL 102 05/27/2014 0942   CO2 28 12/31/2019 1059   CO2 29 05/27/2014 0942   BUN 15 12/31/2019 1059   BUN 13 05/27/2014 0942   CREATININE 0.61 12/31/2019 1059   CREATININE 0.58 05/27/2014 0942      Component Value Date/Time   CALCIUM 9.0 12/31/2019 1059   CALCIUM 8.6 (L) 05/27/2014 0942   ALKPHOS 57 12/31/2019 1059   ALKPHOS 69 05/27/2014 0942   AST 21 12/31/2019 1059   AST 18 05/27/2014 0942   ALT 15 12/31/2019 1059   ALT 14 05/27/2014 0942   BILITOT 0.5 12/31/2019 1059   BILITOT 0.6 05/27/2014 0942       ASSESSMENT & PLAN:   Carcinoma of overlapping sites of left breast in female, estrogen receptor negative ([place]) # 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 On anastrazole.  Elevated Tumor maker led to SEP 2021- Small RIGHT subpectoral lymph node with ovoid morphology, activity only slightly greater than baseline mediastinal blood pool is of uncertain significance. ? COVID vaccine.  Question recurrence-see below  #Await tumor marker from today.  If continues to be elevated or rising would recommend further work-up including imaging/biopsy.  Will discuss with daughter.  For now continue anastrozole.  # HTN- poorly controlled;systolic [id number].  Stable.  Recommend monitoring BPs at home.    # Elevated tumor marker CA 27-29-SEP  44; elevated; see PET scan above; await tumor markers today.  #  History of MALT lymphoma of the lung- clinically no evidence of progression noted; September 2021 PET scan negative.   # Osteoporosis surveillance-she is on fosomax/ vit D;  bone density April 2019 osteopenia. =-1.8; STABLE.   # port flush every 3 months; keep for now.   # COVID BOOSTER: Discussed given patient's diagnosis and other comorbidities/therapies-patient would be considered immunocompromised.  As per CDC recommendation/FDA approval-I would recommend booster vaccine.  Patient is interested.   # DISPOSITION: will call daughter- re: furtherwork up.  # follow up in 3 months-MD/ labs-cbc/cmp/ ca 27-29/port flush-Dr.B.   Cc;Dr.Tate.      Cammie Sickle, MD 12/31/2019 1:01 PM

## 2020-01-01 LAB — CANCER ANTIGEN 27.29: CA 27.29: 34.8 U/mL (ref 0.0–38.6)

## 2020-01-05 ENCOUNTER — Telehealth: Payer: Self-pay | Admitting: Internal Medicine

## 2020-01-05 NOTE — Telephone Encounter (Signed)
On 11/19-left voicemail for the patient's daughter that tumor marker is normal.  I think it is reasonable to hold off any imaging/biopsy at this time.  We will follow closely.  Recommend call us back if any questions or concerns.  Follow-up as planned  GB

## 2020-01-30 ENCOUNTER — Ambulatory Visit
Admission: RE | Admit: 2020-01-30 | Discharge: 2020-01-30 | Disposition: A | Discharge status: Home / Self Care | Payer: Medicare HMO | Source: Ambulatory Visit | Attending: General Surgery | Admitting: General Surgery

## 2020-01-30 ENCOUNTER — Other Ambulatory Visit: Payer: Self-pay

## 2020-01-30 DIAGNOSIS — Z171 Estrogen receptor negative status [ER-]: Secondary | ICD-10-CM | POA: Insufficient documentation

## 2020-01-30 DIAGNOSIS — C50812 Malignant neoplasm of overlapping sites of left female breast: Secondary | ICD-10-CM | POA: Insufficient documentation

## 2020-01-30 DIAGNOSIS — Z1231 Encounter for screening mammogram for malignant neoplasm of breast: Secondary | ICD-10-CM | POA: Insufficient documentation

## 2020-02-20 ENCOUNTER — Telehealth: Payer: Self-pay | Admitting: Internal Medicine

## 2020-02-20 NOTE — Telephone Encounter (Signed)
Contacted patient. She is up to date on her immunization. Booster- Pfizer rcvd on 11/13/2019. I explained to her that at this time, she is up to date with her personal immunizations. Explained to patient that recommendations for a fourth vaccine is not yet determined.

## 2020-03-31 ENCOUNTER — Inpatient Hospital Stay: Payer: Medicare HMO | Attending: Internal Medicine

## 2020-03-31 ENCOUNTER — Other Ambulatory Visit: Payer: Self-pay

## 2020-03-31 ENCOUNTER — Inpatient Hospital Stay (HOSPITAL_BASED_OUTPATIENT_CLINIC_OR_DEPARTMENT_OTHER): Payer: Medicare HMO | Admitting: Internal Medicine

## 2020-03-31 DIAGNOSIS — Z171 Estrogen receptor negative status [ER-]: Secondary | ICD-10-CM | POA: Diagnosis not present

## 2020-03-31 DIAGNOSIS — C50812 Malignant neoplasm of overlapping sites of left female breast: Secondary | ICD-10-CM | POA: Diagnosis not present

## 2020-03-31 DIAGNOSIS — Z79811 Long term (current) use of aromatase inhibitors: Secondary | ICD-10-CM | POA: Diagnosis not present

## 2020-03-31 LAB — CBC WITH DIFFERENTIAL/PLATELET
Abs Immature Granulocytes: 0.01 10*3/uL (ref 0.00–0.07)
Basophils Absolute: 0 10*3/uL (ref 0.0–0.1)
Basophils Relative: 0 %
Eosinophils Absolute: 0 10*3/uL (ref 0.0–0.5)
Eosinophils Relative: 1 %
HCT: 34.9 % — ABNORMAL LOW (ref 36.0–46.0)
Hemoglobin: 11.8 g/dL — ABNORMAL LOW (ref 12.0–15.0)
Immature Granulocytes: 0 %
Lymphocytes Relative: 30 %
Lymphs Abs: 1.6 10*3/uL (ref 0.7–4.0)
MCH: 30.9 pg (ref 26.0–34.0)
MCHC: 33.8 g/dL (ref 30.0–36.0)
MCV: 91.4 fL (ref 80.0–100.0)
Monocytes Absolute: 0.8 10*3/uL (ref 0.1–1.0)
Monocytes Relative: 15 %
Neutro Abs: 2.9 10*3/uL (ref 1.7–7.7)
Neutrophils Relative %: 54 %
Platelets: 245 10*3/uL (ref 150–400)
RBC: 3.82 MIL/uL — ABNORMAL LOW (ref 3.87–5.11)
RDW: 13.1 % (ref 11.5–15.5)
WBC: 5.4 10*3/uL (ref 4.0–10.5)
nRBC: 0 % (ref 0.0–0.2)

## 2020-03-31 LAB — COMPREHENSIVE METABOLIC PANEL
ALT: 14 U/L (ref 0–44)
AST: 22 U/L (ref 15–41)
Albumin: 3.9 g/dL (ref 3.5–5.0)
Alkaline Phosphatase: 60 U/L (ref 38–126)
Anion gap: 10 (ref 5–15)
BUN: 15 mg/dL (ref 8–23)
CO2: 29 mmol/L (ref 22–32)
Calcium: 9.2 mg/dL (ref 8.9–10.3)
Chloride: 99 mmol/L (ref 98–111)
Creatinine, Ser: 0.58 mg/dL (ref 0.44–1.00)
GFR, Estimated: >60 mL/min (ref >60)
Glucose, Bld: 95 mg/dL (ref 70–99)
Potassium: 3.3 mmol/L — ABNORMAL LOW (ref 3.5–5.1)
Sodium: 138 mmol/L (ref 135–145)
Total Bilirubin: 0.6 mg/dL (ref 0.3–1.2)
Total Protein: 7.8 g/dL (ref 6.5–8.1)

## 2020-03-31 MED ORDER — HEPARIN SOD (PORK) LOCK FLUSH 100 UNIT/ML IV SOLN
500.0000 [IU] | Freq: Once | INTRAVENOUS | Status: AC
  Administered 2020-03-31: 500 [IU] via INTRAVENOUS
  Filled 2020-03-31: qty 5

## 2020-03-31 MED ORDER — HEPARIN SOD (PORK) LOCK FLUSH 100 UNIT/ML IV SOLN
INTRAVENOUS | Status: AC
  Filled 2020-03-31: qty 5

## 2020-03-31 MED ORDER — SODIUM CHLORIDE 0.9% FLUSH
10.0000 mL | Freq: Once | INTRAVENOUS | Status: AC
  Administered 2020-03-31: 10 mL via INTRAVENOUS
  Filled 2020-03-31: qty 10

## 2020-03-31 NOTE — Assessment & Plan Note (Addendum)
#  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 On anastrazole.  PET scan- SEP 2021- Small RIGHT subpectoral lymph node with ovoid morphology, activity only slightly greater than baseline mediastinal blood pool is of uncertain significance [DEC TM-WNL; February 2022-tumor marker normal.]  Continue anastrozole.  # HTN- poorly controlled;systolic MD-470L.  STABLE.    # Intermittently Elevated tumor marker CA 27-29-SEP 44; repeat tumor markers normal.  # History of MALT lymphoma of the lung- clinically no evidence of progression noted; September 2021 PET scan negative-STABLE.   # Osteoporosis surveillance-she is on fosomax/ vit D;  bone density April 2019 osteopenia. =-1.8; STABLE.   # port flush every 3 months; keep for now.   # DISPOSITION: .  # follow up in 3 months-MD/ labs-cbc/cmp/ ca 27-29/port flush-Dr.B.   Cc;Dr.Tate.

## 2020-03-31 NOTE — Progress Notes (Signed)
[place] OFFICE PROGRESS NOTE  Patient Care Team: Albina Billet, MD as PCP - General (Internal Medicine) Bary Castilla, Forest Gleason, MD as Consulting Physician (General Surgery) Albina Billet, MD (Internal Medicine) Cammie Sickle, MD as Consulting Physician (Hematology and Oncology)   SUMMARY OF ONCOLOGIC HISTORY:  Oncology History Overview Note  # AUG 2015- LEFT BREAST  STAGE IIB [pT2pN1a]ER-NEG; PR- 1-10%; Her 2 NEU POS; Started neo-adj chemo- AC x4 [lung-MALT lymphoma]; Min response Breast tumor- Lumpec & ALND- T= 3.2cm; N= 1/11LN. 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 STABLE band like opacity in RUL/RML MAY 2017 CT-NED  # SEP21st 2016- MUGA 61 %; DEC 22nd 2016- 65%  # LEFT UE LYMPHEDEMA s/p PT ----------------------------------------------------    DIAGNOSIS: LEFT BREAST CA  STAGE:  II ;GOALS: curative  CURRENT/MOST RECENT THERAPY: arimidex    Mucosa-associated lymphoid tissue (MALT) lymphoma ([place])  04/21/2015 Initial Diagnosis   Mucosa-associated lymphoid tissue (MALT) lymphoma ([facility])   Carcinoma of overlapping sites of left breast in female, estrogen receptor negative ([place])  09/23/2015 Initial Diagnosis   Cancer of overlapping sites of left female breast ([place])      INTERVAL HISTORY:  A pleasant 80 year-old female patient with above history of stage 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  Denies any new shortness of breath or cough he denies any pain.  Chronic joint pains back pain.  Not any worse.  No new lumps or bumps.  No blood in stools or black or stools.  Review of Systems  Constitutional: Positive for malaise/fatigue. Negative for chills, diaphoresis and fever.  HENT: Negative for nosebleeds and sore throat.   Eyes: Negative for double vision.  Respiratory: Negative for  cough, hemoptysis, sputum production, shortness of breath and wheezing.   Cardiovascular: Negative for chest pain, palpitations, orthopnea and leg swelling.  Gastrointestinal: Negative for abdominal pain, blood in stool, constipation, diarrhea, heartburn, melena, nausea and vomiting.  Genitourinary: Negative for dysuria, frequency and urgency.  Musculoskeletal: Positive for back pain and joint pain.  Skin: Negative.  Negative for itching and rash.  Neurological: Negative for dizziness, tingling, focal weakness, weakness and headaches.  Endo/Heme/Allergies: Does not bruise/bleed easily.  Psychiatric/Behavioral: Negative for depression. The patient is not nervous/anxious and does not have insomnia.      PAST MEDICAL HISTORY :  Past Medical History:  Diagnosis Date  . Breast cancer ([place]) 2015   left breast cancer  . Breast cancer metastasized to axillary lymph node ([place]) August 2015   T2, N1, ER negative, PR negative, HER-2 amplified. 2 cm axillary node. One of 11 nodes positive on post-adjuvant chemotherapy axillary dissection. 3+ centimeter tumor.  . Hyperlipemia   . Hyperlipidemia   . Hypertension   . 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  Marland Kitchen MALT lymphoma ([place])   . Murmur   . Osteoporosis   . Personal history of chemotherapy     PAST SURGICAL HISTORY :   Past Surgical History:  Procedure Laterality Date  . ABDOMINAL HYSTERECTOMY    . APPENDECTOMY    . BREAST SURGERY Left 02/25/14   Modified radical mastectomy, T2 N1. Grade 3, ER, PR negative, HER-2/neu 3+.  . COLONOSCOPY WITH PROPOFOL N/A 01/26/2016   Procedure: COLONOSCOPY WITH PROPOFOL;  Surgeon: [name], MD;  Location: [facility] ENDOSCOPY;  Service: Endoscopy;  Laterality: N/A;  . MASTECTOMY Left 02-25-14   Dr Bary Castilla    FAMILY HISTORY :   Family History  Problem Relation Age of Onset  . Hypertension Mother   . Hypertension Maternal Aunt   . Hypertension  Maternal Uncle   . Hypertension Maternal Aunt   . Hypertension Maternal Aunt   . Breast cancer Neg Hx     SOCIAL HISTORY:   Social History   Tobacco Use  . Smoking status: Never Smoker  . Smokeless tobacco: Never Used  Substance Use Topics  . Alcohol use: No  . Drug use: No    ALLERGIES:  has No Known Allergies.  MEDICATIONS:  Current Outpatient Medications  Medication Sig Dispense Refill  . alendronate (FOSAMAX) 70 MG tablet Take 70 mg by mouth once a week. Take with a full glass of water on an empty stomach.    Marland Kitchen amLODipine (NORVASC) 2.5 MG tablet Take 2.5 mg by mouth daily.    Marland Kitchen anastrozole (ARIMIDEX) 1 MG tablet TAKE ONE TABLET BY MOUTH ONCE DAILY 90 tablet 3  . aspirin 81 MG tablet Take 81 mg by mouth daily.    . cholecalciferol (VITAMIN D) 1000 UNITS tablet Take 1,000 Units by mouth daily.    Marland Kitchen losartan-hydrochlorothiazide (HYZAAR) 100-25 MG per tablet Take 1 tablet by mouth daily.    . Multiple Vitamins-Minerals (MULTIVITAMIN WITH MINERALS) tablet Take 1 tablet by mouth daily.    . simvastatin (ZOCOR) 40 MG tablet Take 40 mg by mouth at bedtime.     No current facility-administered medications for this visit.   Facility-Administered Medications Ordered in Other Visits  Medication Dose Route Frequency Provider Last Rate Last Admin  . sodium chloride flush (NS) 0.9 % injection 10 mL  10 mL Intravenous PRN Cammie Sickle, MD   10 mL at 10/19/16 1054  . sodium chloride flush (NS) 0.9 % injection 10 mL  10 mL Intravenous PRN Cammie Sickle, MD   10 mL at 12/13/17 1050    PHYSICAL EXAMINATION: ECOG PERFORMANCE STATUS: 0 - Asymptomatic  BP (!) 149/71 (BP Location: Right Arm, Patient Position: Sitting)   Pulse 73   Temp 98.6 F (37 C) (Tympanic)   Wt 132 lb 2 oz (59.9 kg)   SpO2 100%   BMI 25.80 kg/m   Filed Weights   03/31/20 1031  Weight: 132 lb 2 oz (59.9 kg)    Physical Exam Constitutional:      Comments: Frail-appearing female patient.  She  is walking herself.  Alone.  HENT:     Head: Normocephalic and atraumatic.     Mouth/Throat:     Pharynx: No oropharyngeal exudate.  Eyes:     Pupils: Pupils are equal, round, and reactive to light.  Cardiovascular:     Rate and Rhythm: Normal rate and regular rhythm.  Pulmonary:     Effort: No respiratory distress.     Breath sounds: No wheezing.  Abdominal:     General: Bowel sounds are normal. There is no distension.     Palpations: Abdomen is soft. There is no mass.     Tenderness: There is no abdominal tenderness. There is no guarding or rebound.  Musculoskeletal:        General: No tenderness. Normal range of motion.     Cervical back: Normal range of motion and neck supple.  Skin:    General: Skin is warm.  Neurological:     Mental Status: She is alert and oriented  to person, place, and time.  Psychiatric:        Mood and Affect: Affect normal.      LABORATORY DATA:  I have reviewed the data as listed    Component Value Date/Time   NA 138 03/31/2020 1010   NA 136 05/27/2014 0942   K 3.3 (L) 03/31/2020 1010   K 3.4 (L) 05/27/2014 0942   CL 99 03/31/2020 1010   CL 102 05/27/2014 0942   CO2 29 03/31/2020 1010   CO2 29 05/27/2014 0942   GLUCOSE 95 03/31/2020 1010   GLUCOSE 106 (H) 05/27/2014 0942   BUN 15 03/31/2020 1010   BUN 13 05/27/2014 0942   CREATININE 0.58 03/31/2020 1010   CREATININE 0.58 05/27/2014 0942   CALCIUM 9.2 03/31/2020 1010   CALCIUM 8.6 (L) 05/27/2014 0942   PROT 7.8 03/31/2020 1010   PROT 6.8 05/27/2014 0942   ALBUMIN 3.9 03/31/2020 1010   ALBUMIN 3.8 05/27/2014 0942   AST 22 03/31/2020 1010   AST 18 05/27/2014 0942   ALT 14 03/31/2020 1010   ALT 14 05/27/2014 0942   ALKPHOS 60 03/31/2020 1010   ALKPHOS 69 05/27/2014 0942   BILITOT 0.6 03/31/2020 1010   BILITOT 0.6 05/27/2014 0942   GFRNONAA >60 03/31/2020 1010   GFRNONAA >60 05/27/2014 0942   GFRAA >60 10/22/2019 1457   GFRAA >60 05/27/2014 0942    No results found for: SPEP,  UPEP  Lab Results  Component Value Date   WBC 5.4 03/31/2020   NEUTROABS 2.9 03/31/2020   HGB 11.8 (L) 03/31/2020   HCT 34.9 (L) 03/31/2020   MCV 91.4 03/31/2020   PLT 245 03/31/2020      Chemistry      Component Value Date/Time   NA 138 03/31/2020 1010   NA 136 05/27/2014 0942   K 3.3 (L) 03/31/2020 1010   K 3.4 (L) 05/27/2014 0942   CL 99 03/31/2020 1010   CL 102 05/27/2014 0942   CO2 29 03/31/2020 1010   CO2 29 05/27/2014 0942   BUN 15 03/31/2020 1010   BUN 13 05/27/2014 0942   CREATININE 0.58 03/31/2020 1010   CREATININE 0.58 05/27/2014 0942      Component Value Date/Time   CALCIUM 9.2 03/31/2020 1010   CALCIUM 8.6 (L) 05/27/2014 0942   ALKPHOS 60 03/31/2020 1010   ALKPHOS 69 05/27/2014 0942   AST 22 03/31/2020 1010   AST 18 05/27/2014 0942   ALT 14 03/31/2020 1010   ALT 14 05/27/2014 0942   BILITOT 0.6 03/31/2020 1010   BILITOT 0.6 05/27/2014 0942       ASSESSMENT & PLAN:   Carcinoma of overlapping sites of left breast in female, estrogen receptor negative ([facility]) # 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 On anastrazole.  PET scan- SEP 2021- Small RIGHT subpectoral lymph node with ovoid morphology, activity only slightly greater than baseline mediastinal blood pool is of uncertain significance [DEC TM-WNL; February 2022-tumor marker normal.]  Continue anastrozole.  # HTN- poorly controlled;systolic BP-140s.  STABLE.    # Intermittently Elevated tumor marker CA 27-29-SEP 44; repeat tumor markers normal.  # History of MALT lymphoma of the lung- clinically no evidence of progression noted; September 2021 PET scan negative-STABLE.   # Osteoporosis surveillance-she is on fosomax/ vit D;  bone density April 2019 osteopenia. =-1.8; STABLE.   # port flush every 3 months; keep for now.   # DISPOSITION: .  #  follow up in 3 months-MD/ labs-cbc/cmp/ ca 27-29/port flush-Dr.B.    Cc;Dr.Tate.      Cammie Sickle, MD 04/01/2020 8:28 AM

## 2020-04-01 LAB — CANCER ANTIGEN 27.29: CA 27.29: 30.1 U/mL (ref 0.0–38.6)

## 2020-04-21 ENCOUNTER — Ambulatory Visit: Payer: Medicare HMO | Admitting: Internal Medicine

## 2020-04-21 ENCOUNTER — Other Ambulatory Visit: Payer: Medicare HMO

## 2020-06-30 ENCOUNTER — Inpatient Hospital Stay: Payer: Medicare HMO | Attending: Oncology

## 2020-06-30 ENCOUNTER — Other Ambulatory Visit: Payer: Self-pay

## 2020-06-30 ENCOUNTER — Encounter: Payer: Self-pay | Admitting: Oncology

## 2020-06-30 ENCOUNTER — Inpatient Hospital Stay (HOSPITAL_BASED_OUTPATIENT_CLINIC_OR_DEPARTMENT_OTHER): Payer: Medicare HMO | Admitting: Oncology

## 2020-06-30 VITALS — BP 147/65 | HR 75 | Temp 98.0°F | Resp 16 | Ht 60.0 in | Wt 135.0 lb

## 2020-06-30 DIAGNOSIS — R978 Other abnormal tumor markers: Secondary | ICD-10-CM | POA: Diagnosis not present

## 2020-06-30 DIAGNOSIS — Z171 Estrogen receptor negative status [ER-]: Secondary | ICD-10-CM | POA: Insufficient documentation

## 2020-06-30 DIAGNOSIS — R599 Enlarged lymph nodes, unspecified: Secondary | ICD-10-CM

## 2020-06-30 DIAGNOSIS — M8580 Other specified disorders of bone density and structure, unspecified site: Secondary | ICD-10-CM | POA: Diagnosis not present

## 2020-06-30 DIAGNOSIS — C50812 Malignant neoplasm of overlapping sites of left female breast: Secondary | ICD-10-CM | POA: Diagnosis not present

## 2020-06-30 DIAGNOSIS — Z79899 Other long term (current) drug therapy: Secondary | ICD-10-CM | POA: Insufficient documentation

## 2020-06-30 DIAGNOSIS — I10 Essential (primary) hypertension: Secondary | ICD-10-CM | POA: Insufficient documentation

## 2020-06-30 DIAGNOSIS — Z8572 Personal history of non-Hodgkin lymphomas: Secondary | ICD-10-CM | POA: Insufficient documentation

## 2020-06-30 DIAGNOSIS — Z853 Personal history of malignant neoplasm of breast: Secondary | ICD-10-CM | POA: Insufficient documentation

## 2020-06-30 LAB — COMPREHENSIVE METABOLIC PANEL
ALT: 16 U/L (ref 0–44)
AST: 20 U/L (ref 15–41)
Albumin: 4.1 g/dL (ref 3.5–5.0)
Alkaline Phosphatase: 56 U/L (ref 38–126)
Anion gap: 9 (ref 5–15)
BUN: 13 mg/dL (ref 8–23)
CO2: 27 mmol/L (ref 22–32)
Calcium: 8.6 mg/dL — ABNORMAL LOW (ref 8.9–10.3)
Chloride: 101 mmol/L (ref 98–111)
Creatinine, Ser: 0.62 mg/dL (ref 0.44–1.00)
GFR, Estimated: >60 mL/min (ref >60)
Glucose, Bld: 97 mg/dL (ref 70–99)
Potassium: 3.4 mmol/L — ABNORMAL LOW (ref 3.5–5.1)
Sodium: 137 mmol/L (ref 135–145)
Total Bilirubin: 0.7 mg/dL (ref 0.3–1.2)
Total Protein: 7.6 g/dL (ref 6.5–8.1)

## 2020-06-30 LAB — CBC WITH DIFFERENTIAL/PLATELET
Abs Immature Granulocytes: 0.01 10*3/uL (ref 0.00–0.07)
Basophils Absolute: 0 10*3/uL (ref 0.0–0.1)
Basophils Relative: 0 %
Eosinophils Absolute: 0.1 10*3/uL (ref 0.0–0.5)
Eosinophils Relative: 1 %
HCT: 35.5 % — ABNORMAL LOW (ref 36.0–46.0)
Hemoglobin: 11.9 g/dL — ABNORMAL LOW (ref 12.0–15.0)
Immature Granulocytes: 0 %
Lymphocytes Relative: 33 %
Lymphs Abs: 1.8 10*3/uL (ref 0.7–4.0)
MCH: 30.9 pg (ref 26.0–34.0)
MCHC: 33.5 g/dL (ref 30.0–36.0)
MCV: 92.2 fL (ref 80.0–100.0)
Monocytes Absolute: 1 10*3/uL (ref 0.1–1.0)
Monocytes Relative: 18 %
Neutro Abs: 2.5 10*3/uL (ref 1.7–7.7)
Neutrophils Relative %: 48 %
Platelets: 227 10*3/uL (ref 150–400)
RBC: 3.85 MIL/uL — ABNORMAL LOW (ref 3.87–5.11)
RDW: 12.9 % (ref 11.5–15.5)
WBC: 5.3 10*3/uL (ref 4.0–10.5)
nRBC: 0 % (ref 0.0–0.2)

## 2020-06-30 MED ORDER — HEPARIN SOD (PORK) LOCK FLUSH 100 UNIT/ML IV SOLN
500.0000 [IU] | Freq: Once | INTRAVENOUS | Status: AC
  Administered 2020-06-30: 500 [IU] via INTRAVENOUS
  Filled 2020-06-30: qty 5

## 2020-06-30 MED ORDER — SODIUM CHLORIDE 0.9% FLUSH
10.0000 mL | INTRAVENOUS | Status: DC | PRN
  Administered 2020-06-30: 10 mL via INTRAVENOUS
  Filled 2020-06-30: qty 10

## 2020-06-30 NOTE — Progress Notes (Signed)
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  Andrea Rodgers reports she is continuing to tolerate the anastrozole. Denies recent falls.   She reports she is happy with her weight gain, as she has gained 3 pounds since her last visit in February.   Denies any fevers, weight loss, shortness of breath, cough pan, hematuria, hematochezia. Reports chronic joint pain and back pain, however she is still able to work in her garden.  No  new lumps or bumps. She preforms self breast exams monthly. Next Mammogram is due [place] 2022.   She is due for a Bone Density scan (last Apr 2019). She is still taking her fosamax, and Vit. D.   Review of Systems  Constitutional: Negative for fever, malaise/fatigue and weight loss.  HENT: Negative for congestion and hearing loss.   Eyes: Negative for blurred vision and double vision.  Respiratory: Negative for cough.   Cardiovascular: Negative for chest pain and palpitations.  Gastrointestinal: Negative for abdominal pain, constipation, diarrhea, nausea and vomiting.  Genitourinary: Negative for frequency and urgency.  Skin: Negative for rash.  Neurological: Negative for dizziness, tingling and headaches.  Endo/Heme/Allergies: Does not bruise/bleed easily.  Psychiatric/Behavioral: Negative for depression. The patient is not nervous/anxious and does not have insomnia.      PAST MEDICAL HISTORY :  Past Medical History:  Diagnosis Date  . Breast cancer ([place]) 2015   left breast cancer  . Breast cancer metastasized to axillary lymph node ([place]) August 2015   T2, N1, ER negative, PR negative, HER-2 amplified. 2 cm axillary node. One of 11 nodes positive on post-adjuvant chemotherapy axillary dissection. 3+ centimeter tumor.  . Hyperlipemia   . Hyperlipidemia   . Hypertension   . 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  Marland Kitchen MALT lymphoma ([place])   . Murmur   . Osteoporosis   . Personal history of chemotherapy     PAST SURGICAL HISTORY :   Past Surgical History:  Procedure Laterality Date  . ABDOMINAL HYSTERECTOMY    . APPENDECTOMY    .  BREAST SURGERY Left 02/25/14   Modified radical mastectomy, T2 N1. Grade 3, ER, PR negative, HER-2/neu 3+.  . COLONOSCOPY WITH PROPOFOL N/A 01/26/2016   Procedure: COLONOSCOPY WITH PROPOFOL;  Surgeon: [name], MD;  Location[facility] END[facility]COPY;  Service: Endoscopy;  Laterality: N/A;  . MASTECTOMY  Left 02-25-14   Dr Bary Castilla    FAMILY HISTORY :   Family History  Problem Relation Age of Onset  . Hypertension Mother   . Hypertension Maternal Aunt   . Hypertension Maternal Uncle   . Hypertension Maternal Aunt   . Hypertension Maternal Aunt   . Breast cancer Neg Hx     SOCIAL HISTORY:   Social History   Tobacco Use  . Smoking status: Never Smoker  . Smokeless tobacco: Never Used  Substance Use Topics  . Alcohol use: No  . Drug use: No    ALLERGIES:  has No Known Allergies.  MEDICATIONS:  Current Outpatient Medications  Medication Sig Dispense Refill  . alendronate (FOSAMAX) 70 MG tablet Take 70 mg by mouth once a week. Take with a full glass of water on an empty stomach.    Marland Kitchen amLODipine (NORVASC) 2.5 MG tablet Take 2.5 mg by mouth daily.    Marland Kitchen anastrozole (ARIMIDEX) 1 MG tablet TAKE ONE TABLET BY MOUTH ONCE DAILY 90 tablet 3  . aspirin 81 MG tablet Take 81 mg by mouth daily.    . cholecalciferol (VITAMIN D) 1000 UNITS tablet Take 1,000 Units by mouth daily.    Marland Kitchen losartan-hydrochlorothiazide (HYZAAR) 100-25 MG per tablet Take 1 tablet by mouth daily.    . Multiple Vitamins-Minerals (MULTIVITAMIN WITH MINERALS) tablet Take 1 tablet by mouth daily.    . simvastatin (ZOCOR) 40 MG tablet Take 40 mg by mouth at bedtime.     No current facility-administered medications for this visit.   Facility-Administered Medications Ordered in Other Visits  Medication Dose Route Frequency Provider Last Rate Last Admin  . sodium chloride flush (NS) 0.9 % injection 10 mL  10 mL Intravenous PRN Cammie Sickle, MD   10 mL at 10/19/16 1054  . sodium chloride flush (NS) 0.9 % injection 10 mL  10 mL Intravenous PRN Cammie Sickle, MD   10 mL at 12/13/17 1050  . sodium chloride flush (NS) 0.9 % injection 10 mL  10 mL Intravenous PRN Verlon Au, NP   10 mL at 06/30/20 1019    PHYSICAL EXAMINATION: ECOG PERFORMANCE STATUS: 0 - Asymptomatic  BP (!) 162/72 (BP Location: Right  Arm, Patient Position: Sitting, Cuff Size: Normal)   Pulse 75   Temp 98 F (36.7 C) (Tympanic)   Resp 16   Ht 5' (1.524 m)   Wt 135 lb (61.2 kg)   SpO2 100%   BMI 26.37 kg/m   Filed Weights   06/30/20 1029  Weight: 135 lb (61.2 kg)    Physical Exam Vitals and nursing note reviewed.  HENT:     Head: Normocephalic and atraumatic.     Nose: No congestion.     Mouth/Throat:     Mouth: Mucous membranes are moist.     Pharynx: Oropharynx is clear.  Eyes:     General:        Right eye: No discharge.        Left eye: No discharge.     Extraocular Movements: Extraocular movements intact.     Pupils: Pupils are equal, round, and reactive to light.  Cardiovascular:     Rate and  Rhythm: Normal rate and regular rhythm.     Pulses: Normal pulses.     Heart sounds: No murmur heard.   Pulmonary:     Effort: Pulmonary effort is normal.  Abdominal:     General: Bowel sounds are normal. There is no distension.     Tenderness: There is no abdominal tenderness. There is no guarding.  Musculoskeletal:        General: No swelling or deformity.     Right lower leg: No edema.     Left lower leg: No edema.  Skin:    General: Skin is warm and dry.     Capillary Refill: Capillary refill takes less than 2 seconds.  Neurological:     Mental Status: She is alert and oriented to person, place, and time.  Psychiatric:        Mood and Affect: Mood normal.        Thought Content: Thought content normal.       LABORATORY DATA:  I have reviewed the data as listed    Component Value Date/Time   NA 137 06/30/2020 1018   NA 136 05/27/2014 0942   K 3.4 (L) 06/30/2020 1018   K 3.4 (L) 05/27/2014 0942   CL 101 06/30/2020 1018   CL 102 05/27/2014 0942   CO2 27 06/30/2020 1018   CO2 29 05/27/2014 0942   GLUCOSE 97 06/30/2020 1018   GLUCOSE 106 (H) 05/27/2014 0942   BUN 13 06/30/2020 1018   BUN 13 05/27/2014 0942   CREATININE 0.62 06/30/2020 1018   CREATININE 0.58 05/27/2014 0942    CALCIUM 8.6 (L) 06/30/2020 1018   CALCIUM 8.6 (L) 05/27/2014 0942   PROT 7.6 06/30/2020 1018   PROT 6.8 05/27/2014 0942   ALBUMIN 4.1 06/30/2020 1018   ALBUMIN 3.8 05/27/2014 0942   AST 20 06/30/2020 1018   AST 18 05/27/2014 0942   ALT 16 06/30/2020 1018   ALT 14 05/27/2014 0942   ALKPHOS 56 06/30/2020 1018   ALKPHOS 69 05/27/2014 0942   BILITOT 0.7 06/30/2020 1018   BILITOT 0.6 05/27/2014 0942   GFRNONAA >60 06/30/2020 1018   GFRNONAA >60 05/27/2014 0942   GFRAA >60 10/22/2019 1457   GFRAA >60 05/27/2014 0942    No results found for: SPEP, UPEP  Lab Results  Component Value Date   WBC 5.4 03/31/2020   NEUTROABS 2.9 03/31/2020   HGB 11.8 (L) 03/31/2020   HCT 34.9 (L) 03/31/2020   MCV 91.4 03/31/2020   PLT 245 03/31/2020      Chemistry      Component Value Date/Time   NA 138 03/31/2020 1010   NA 136 05/27/2014 0942   K 3.3 (L) 03/31/2020 1010   K 3.4 (L) 05/27/2014 0942   CL 99 03/31/2020 1010   CL 102 05/27/2014 0942   CO2 29 03/31/2020 1010   CO2 29 05/27/2014 0942   BUN 15 03/31/2020 1010   BUN 13 05/27/2014 0942   CREATININE 0.58 03/31/2020 1010   CREATININE 0.58 05/27/2014 0942      Component Value Date/Time   CALCIUM 9.2 03/31/2020 1010   CALCIUM 8.6 (L) 05/27/2014 0942   ALKPHOS 60 03/31/2020 1010   ALKPHOS 69 05/27/2014 0942   AST 22 03/31/2020 1010   AST 18 05/27/2014 0942   ALT 14 03/31/2020 1010   ALT 14 05/27/2014 0942   BILITOT 0.6 03/31/2020 1010   BILITOT 0.6 05/27/2014 0942       ASSESSMENT & PLAN:   Stage  II ER negative PR weak HER-2/neu positive breast cancer: -S/p  adjuvant/maintenance Herceptin [finished Feb 2017]. - Diagnosed in the fall 2016. - PET scan from September 2021 showed small right subpectoral lymph node with ovioid morphology, the slightly greater than baseline mediastinal blood pool is uncertain of significance -Tumor markers have been normal. -She was started on anastrozole in February 2017. -Most recent  mammogram is from December 2021 which was read as BI-RADS Category 1 negative. -Next mammogram is due in December 2022.  Orders placed.   HTN- poorly controlled -Systolic [id number] today.  -Recheck was 147/65 -She reports checking her BP at home -Home BP's reported as 130's/140's -Continue to monitor, take prescribed medication   Intermittently Elevated tumor marker CA 27-29-SEP 44; repeat tumor markers normal. -CA 27-29 from 03/31/2020 was 30.1. -Labs from today are pending.  History of MALT lymphoma of the lung -Clinically no evidence of progression noted; September 2021 PET scan negative-STABLE.   Osteoporosis surveillance -She is on fosomax/ vit D; taking these medications daily -Bone density April 2019 osteopenia. =-1.8; STABLE.  -Bone density scan ordered to be completed in 1-2 weeks from today   DISPOSITION: .  -RTC in 3 months for MD assessment, lab work (CBC, CMP, CA 27-29).   Greater than 50% was spent in counseling and coordination of care with this patient including but not limited to discussion of the relevant topics above (See A&P) including, but not limited to diagnosis and management of acute and chronic medical conditions.    The patient's diagnosis, an outline of the further diagnostic and laboratory studies which will be required, the recommendation for surgery, and alternatives were discussed with her and her accompanying family members.  All questions were answered to their satisfaction.  I personally had a face to face interaction and evaluated the patient jointly with the NP Student, Andrea Rodgers.  I have reviewed her history and available records and have performed the key portions of the physical exam including general, HEENT, abdominal exam, pelvic exam with my findings confirming those documented above by the APP student.  I have discussed the case with the APP student and the patient.  I agree with the above documentation, assessment and plan which  was fully formulated by me.  Counseling was completed by me.    Benedetto Rodgers, Student FNP  Faythe Casa, NP 06/30/2020 3:43 PM

## 2020-06-30 NOTE — Progress Notes (Signed)
Patient tolerated port flush well today, port flushes well. Blood return noted. No concerns voiced, patient discharged, stable.  

## 2020-07-01 LAB — CANCER ANTIGEN 27.29: CA 27.29: 32.4 U/mL (ref 0.0–38.6)

## 2020-08-26 ENCOUNTER — Other Ambulatory Visit: Payer: Self-pay

## 2020-08-26 ENCOUNTER — Ambulatory Visit
Admission: RE | Admit: 2020-08-26 | Discharge: 2020-08-26 | Disposition: A | Discharge status: Home / Self Care | Payer: Medicare HMO | Source: Ambulatory Visit | Attending: Internal Medicine | Admitting: Internal Medicine

## 2020-08-26 DIAGNOSIS — C50812 Malignant neoplasm of overlapping sites of left female breast: Secondary | ICD-10-CM | POA: Diagnosis present

## 2020-08-26 DIAGNOSIS — M8589 Other specified disorders of bone density and structure, multiple sites: Secondary | ICD-10-CM | POA: Diagnosis not present

## 2020-08-26 DIAGNOSIS — Z13820 Encounter for screening for osteoporosis: Secondary | ICD-10-CM | POA: Diagnosis not present

## 2020-08-26 DIAGNOSIS — Z780 Asymptomatic menopausal state: Secondary | ICD-10-CM | POA: Diagnosis not present

## 2020-08-26 DIAGNOSIS — Z171 Estrogen receptor negative status [ER-]: Secondary | ICD-10-CM | POA: Diagnosis present

## 2020-08-27 ENCOUNTER — Other Ambulatory Visit: Payer: Self-pay | Admitting: Internal Medicine

## 2020-08-27 DIAGNOSIS — C50912 Malignant neoplasm of unspecified site of left female breast: Secondary | ICD-10-CM

## 2020-09-29 ENCOUNTER — Inpatient Hospital Stay: Payer: Medicare HMO | Attending: Internal Medicine

## 2020-09-29 ENCOUNTER — Inpatient Hospital Stay (HOSPITAL_BASED_OUTPATIENT_CLINIC_OR_DEPARTMENT_OTHER): Payer: Medicare HMO | Admitting: Internal Medicine

## 2020-09-29 ENCOUNTER — Encounter: Payer: Self-pay | Admitting: Internal Medicine

## 2020-09-29 DIAGNOSIS — Z8572 Personal history of non-Hodgkin lymphomas: Secondary | ICD-10-CM | POA: Insufficient documentation

## 2020-09-29 DIAGNOSIS — I10 Essential (primary) hypertension: Secondary | ICD-10-CM | POA: Diagnosis not present

## 2020-09-29 DIAGNOSIS — Z171 Estrogen receptor negative status [ER-]: Secondary | ICD-10-CM | POA: Insufficient documentation

## 2020-09-29 DIAGNOSIS — Z95828 Presence of other vascular implants and grafts: Secondary | ICD-10-CM

## 2020-09-29 DIAGNOSIS — Z79811 Long term (current) use of aromatase inhibitors: Secondary | ICD-10-CM | POA: Insufficient documentation

## 2020-09-29 DIAGNOSIS — C50812 Malignant neoplasm of overlapping sites of left female breast: Secondary | ICD-10-CM

## 2020-09-29 DIAGNOSIS — Z79899 Other long term (current) drug therapy: Secondary | ICD-10-CM | POA: Diagnosis not present

## 2020-09-29 LAB — COMPREHENSIVE METABOLIC PANEL
ALT: 15 U/L (ref 0–44)
AST: 21 U/L (ref 15–41)
Albumin: 4 g/dL (ref 3.5–5.0)
Alkaline Phosphatase: 68 U/L (ref 38–126)
Anion gap: 7 (ref 5–15)
BUN: 11 mg/dL (ref 8–23)
CO2: 30 mmol/L (ref 22–32)
Calcium: 8.7 mg/dL — ABNORMAL LOW (ref 8.9–10.3)
Chloride: 101 mmol/L (ref 98–111)
Creatinine, Ser: 0.6 mg/dL (ref 0.44–1.00)
GFR, Estimated: >60 mL/min (ref >60)
Glucose, Bld: 105 mg/dL — ABNORMAL HIGH (ref 70–99)
Potassium: 3.4 mmol/L — ABNORMAL LOW (ref 3.5–5.1)
Sodium: 138 mmol/L (ref 135–145)
Total Bilirubin: 0.5 mg/dL (ref 0.3–1.2)
Total Protein: 7.7 g/dL (ref 6.5–8.1)

## 2020-09-29 LAB — CBC WITH DIFFERENTIAL/PLATELET
Abs Immature Granulocytes: 0.02 10*3/uL (ref 0.00–0.07)
Basophils Absolute: 0 10*3/uL (ref 0.0–0.1)
Basophils Relative: 0 %
Eosinophils Absolute: 0 10*3/uL (ref 0.0–0.5)
Eosinophils Relative: 1 %
HCT: 37 % (ref 36.0–46.0)
Hemoglobin: 12.1 g/dL (ref 12.0–15.0)
Immature Granulocytes: 0 %
Lymphocytes Relative: 40 %
Lymphs Abs: 2.2 10*3/uL (ref 0.7–4.0)
MCH: 30.3 pg (ref 26.0–34.0)
MCHC: 32.7 g/dL (ref 30.0–36.0)
MCV: 92.7 fL (ref 80.0–100.0)
Monocytes Absolute: 0.8 10*3/uL (ref 0.1–1.0)
Monocytes Relative: 15 %
Neutro Abs: 2.4 10*3/uL (ref 1.7–7.7)
Neutrophils Relative %: 44 %
Platelets: 233 10*3/uL (ref 150–400)
RBC: 3.99 MIL/uL (ref 3.87–5.11)
RDW: 13.3 % (ref 11.5–15.5)
WBC: 5.4 10*3/uL (ref 4.0–10.5)
nRBC: 0 % (ref 0.0–0.2)

## 2020-09-29 MED ORDER — HEPARIN SOD (PORK) LOCK FLUSH 100 UNIT/ML IV SOLN
500.0000 [IU] | Freq: Once | INTRAVENOUS | Status: AC
  Administered 2020-09-29: 500 [IU]
  Filled 2020-09-29: qty 5

## 2020-09-29 MED ORDER — SODIUM CHLORIDE 0.9% FLUSH
10.0000 mL | Freq: Once | INTRAVENOUS | Status: AC
  Administered 2020-09-29: 10 mL via INTRAVENOUS
  Filled 2020-09-29: qty 10

## 2020-09-29 NOTE — Assessment & Plan Note (Addendum)
#  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 On anastrazole.  PET scan- SEP 2021- Small RIGHT subpectoral lymph node with ovoid morphology, activity only slightly greater than baseline mediastinal blood pool is of uncertain significance [DEC TM-WNL; February 2022-tumor marker normal.]  Continue anastrozole x 6 months and then stop.   # HTN- poorly controlled in clinic- systolic [id number]; at home 120s.   STABLE.    # Intermittently Elevated tumor marker CA 27-29-SEP 44; repeat tumor markers normal.  # History of MALT lymphoma of the lung- clinically no evidence of progression noted; September 2021 PET scan negative-STABLE.   # OSTEOPENIA- JULy 2022-T-score of -1.5; continue Fosamax with vitamin D.'  # port flush every 3 months; keep for now.   # DISPOSITION:  # follow up in 3 months-MD/ labs-cbc/cmp/ ca 27-29/port flush-Dr.B.   Cc;Dr.Tate.

## 2020-09-29 NOTE — Progress Notes (Signed)
[name] OFFICE PROGRESS NOTE  Patient Care Team: Albina Billet, MD as PCP - General (Internal Medicine) Bary Castilla, Forest Gleason, MD as Consulting Physician (General Surgery) Albina Billet, MD (Internal Medicine) Cammie Sickle, MD as Consulting Physician (Hematology and Oncology)   SUMMARY OF ONCOLOGIC HISTORY:  Oncology History Overview Note  # AUG 2015- LEFT BREAST  STAGE IIB [pT2pN1a]ER-NEG; PR- 1-10%; Her 2 NEU POS; Started neo-adj chemo- AC x4 [lung-MALT lymphoma]; Min response Breast tumor- Lumpec & ALND- T= 3.2cm; N= 1/11LN. 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 STABLE band like opacity in RUL/RML MAY 2017 CT-NED  # SEP21st 2016- MUGA 61 %; DEC 22nd 2016- 65%  # LEFT UE LYMPHEDEMA s/p PT ----------------------------------------------------    DIAGNOSIS: LEFT BREAST CA  STAGE:  II ;GOALS: curative  CURRENT/MOST RECENT THERAPY: arimidex    Mucosa-associated lymphoid tissue (MALT) lymphoma ([place])  04/21/2015 Initial Diagnosis   Mucosa-associated lymphoid tissue (MALT) lymphoma ([facility])   Carcinoma of overlapping sites of left breast in female, estrogen receptor negative (Andrea Rodgers)  09/23/2015 Initial Diagnosis   Cancer of overlapping sites of left female breast (Andrea Rodgers)      INTERVAL HISTORY:  A pleasant 80 year-old female patient with above history of stage IIB breast cancer Andrea Rodgers positive - Currently on anastrozole is here for follow-up.  No new shortness of breath or cough.  No fevers or chills.  No new lumps or bumps.  Review of Systems  Constitutional:  Positive for malaise/fatigue. Negative for chills, diaphoresis and fever.  HENT:  Negative for nosebleeds and sore throat.   Eyes:  Negative for double vision.  Respiratory:  Negative for cough, hemoptysis, sputum production, shortness of breath and wheezing.   Cardiovascular:   Negative for chest pain, palpitations, orthopnea and leg swelling.  Gastrointestinal:  Negative for abdominal pain, blood in stool, constipation, diarrhea, heartburn, melena, nausea and vomiting.  Genitourinary:  Negative for dysuria, frequency and urgency.  Musculoskeletal:  Positive for back pain and joint pain.  Skin: Negative.  Negative for itching and rash.  Neurological:  Negative for dizziness, tingling, focal weakness, weakness and headaches.  Endo/Heme/Allergies:  Does not bruise/bleed easily.  Psychiatric/Behavioral:  Negative for depression. The patient is not nervous/anxious and does not have insomnia.     PAST MEDICAL HISTORY :  Past Medical History:  Diagnosis Date   Breast cancer (Andrea Rodgers) 2015   left breast cancer   Breast cancer metastasized to axillary lymph node (Andrea Rodgers) August 2015   T2, N1, ER negative, PR negative, HER-2 amplified. 2 cm axillary node. One of 11 nodes positive on post-adjuvant chemotherapy axillary dissection. 3+ centimeter tumor.   Hyperlipemia    Hyperlipidemia    Hypertension    Lymphedema of upper extremity following lymphadenectomy August 2015   Left upper extremity.   MALT lymphoma (Andrea Rodgers)    Murmur    Osteoporosis    Personal history of chemotherapy     PAST SURGICAL HISTORY :   Past Surgical History:  Procedure Laterality Date   ABDOMINAL HYSTERECTOMY     APPENDECTOMY     BREAST SURGERY Left 02/25/14   Modified radical mastectomy, T2 N1. Grade 3, ER, PR negative, Andrea Rodgers 3+.   COLONOSCOPY WITH PROPOFOL N/A 01/26/2016   Procedure: COLONOSCOPY WITH PROPOFOL;  Surgeon: [name], MD;  Location: [facility] ENDOSCOPY;  Service: Endoscopy;  Laterality: N/A;   MASTECTOMY  Left 02-25-14   Dr Bary Castilla    FAMILY HISTORY :   Family History  Problem Relation Age of Onset   Hypertension Mother    Hypertension Maternal Aunt    Hypertension Maternal Uncle    Hypertension Maternal Aunt    Hypertension Maternal Aunt    Breast cancer Neg Hx      SOCIAL HISTORY:   Social History   Tobacco Use   Smoking status: Never   Smokeless tobacco: Never  Substance Use Topics   Alcohol use: No   Drug use: No    ALLERGIES:  has No Known Allergies.  MEDICATIONS:  Current Outpatient Medications  Medication Sig Dispense Refill   alendronate (FOSAMAX) 70 MG tablet Take 70 mg by mouth once a week. Take with a full glass of water on an empty stomach.     amLODipine (NORVASC) 2.5 MG tablet Take 2.5 mg by mouth daily.     anastrozole (ARIMIDEX) 1 MG tablet TAKE ONE TABLET BY MOUTH ONCE DAILY 90 tablet 3   aspirin 81 MG tablet Take 81 mg by mouth daily.     cholecalciferol (VITAMIN D) 1000 UNITS tablet Take 1,000 Units by mouth daily.     losartan-hydrochlorothiazide (HYZAAR) 100-25 MG per tablet Take 1 tablet by mouth daily.     Multiple Vitamins-Minerals (MULTIVITAMIN WITH MINERALS) tablet Take 1 tablet by mouth daily.     simvastatin (ZOCOR) 40 MG tablet Take 40 mg by mouth at bedtime.     No current facility-administered medications for this visit.   Facility-Administered Medications Ordered in Other Visits  Medication Dose Route Frequency Provider Last Rate Last Admin   sodium chloride flush (NS) 0.9 % injection 10 mL  10 mL Intravenous PRN [name] R, MD   10 mL at 10/19/16 1054   sodium chloride flush (NS) 0.9 % injection 10 mL  10 mL Intravenous PRN Cammie Sickle, MD   10 mL at 12/13/17 1050    PHYSICAL EXAMINATION: ECOG PERFORMANCE STATUS: 0 - Asymptomatic  BP (!) 175/76 Comment: recheck  Pulse 82   Temp (!) 97.4 F (36.3 C)   Resp 18   Wt 134 lb (60.8 kg)   SpO2 98%   BMI 26.17 kg/m   Filed Weights   09/29/20 1019  Weight: 134 lb (60.8 kg)    Physical Exam Constitutional:      Comments: Frail-appearing female patient.  She is walking herself.  Alone.  HENT:     Head: Normocephalic and atraumatic.     Mouth/Throat:     Pharynx: No oropharyngeal exudate.  Eyes:     Pupils: Pupils are  equal, round, and reactive to light.  Cardiovascular:     Rate and Rhythm: Normal rate and regular rhythm.  Pulmonary:     Effort: No respiratory distress.     Breath sounds: No wheezing.  Abdominal:     General: Bowel sounds are normal. There is no distension.     Palpations: Abdomen is soft. There is no mass.     Tenderness: no abdominal tenderness There is no guarding or rebound.  Musculoskeletal:        General: No tenderness. Normal range of motion.     Cervical back: Normal range of motion and neck supple.  Skin:    General: Skin is warm.  Neurological:     Mental Status: She is alert and oriented to person, place, and time.  Psychiatric:        Mood and Affect: Affect  normal.     LABORATORY DATA:  I have reviewed the data as listed    Component Value Date/Time   NA 138 09/29/2020 1001   NA 136 05/27/2014 0942   K 3.4 (L) 09/29/2020 1001   K 3.4 (L) 05/27/2014 0942   CL 101 09/29/2020 1001   CL 102 05/27/2014 0942   CO2 30 09/29/2020 1001   CO2 29 05/27/2014 0942   GLUCOSE 105 (H) 09/29/2020 1001   GLUCOSE 106 (H) 05/27/2014 0942   BUN 11 09/29/2020 1001   BUN 13 05/27/2014 0942   CREATININE 0.60 09/29/2020 1001   CREATININE 0.58 05/27/2014 0942   CALCIUM 8.7 (L) 09/29/2020 1001   CALCIUM 8.6 (L) 05/27/2014 0942   PROT 7.7 09/29/2020 1001   PROT 6.8 05/27/2014 0942   ALBUMIN 4.0 09/29/2020 1001   ALBUMIN 3.8 05/27/2014 0942   AST 21 09/29/2020 1001   AST 18 05/27/2014 0942   ALT 15 09/29/2020 1001   ALT 14 05/27/2014 0942   ALKPHOS 68 09/29/2020 1001   ALKPHOS 69 05/27/2014 0942   BILITOT 0.5 09/29/2020 1001   BILITOT 0.6 05/27/2014 0942   GFRNONAA >60 09/29/2020 1001   GFRNONAA >60 05/27/2014 0942   GFRAA >60 10/22/2019 1457   GFRAA >60 05/27/2014 0942    No results found for: SPEP, UPEP  Lab Results  Component Value Date   WBC 5.4 09/29/2020   NEUTROABS 2.4 09/29/2020   HGB 12.1 09/29/2020   HCT 37.0 09/29/2020   MCV 92.7 09/29/2020   PLT  233 09/29/2020      Chemistry      Component Value Date/Time   NA 138 09/29/2020 1001   NA 136 05/27/2014 0942   K 3.4 (L) 09/29/2020 1001   K 3.4 (L) 05/27/2014 0942   CL 101 09/29/2020 1001   CL 102 05/27/2014 0942   CO2 30 09/29/2020 1001   CO2 29 05/27/2014 0942   BUN 11 09/29/2020 1001   BUN 13 05/27/2014 0942   CREATININE 0.60 09/29/2020 1001   CREATININE 0.58 05/27/2014 0942      Component Value Date/Time   CALCIUM 8.7 (L) 09/29/2020 1001   CALCIUM 8.6 (L) 05/27/2014 0942   ALKPHOS 68 09/29/2020 1001   ALKPHOS 69 05/27/2014 0942   AST 21 09/29/2020 1001   AST 18 05/27/2014 0942   ALT 15 09/29/2020 1001   ALT 14 05/27/2014 0942   BILITOT 0.5 09/29/2020 1001   BILITOT 0.6 05/27/2014 0942       ASSESSMENT & PLAN:   Carcinoma of overlapping sites of left breast in female, estrogen receptor negative ([facility]) # Stage II ER negative PR weak Andrea Rodgers positive breast cancer s/p  adjuvant/maintenance Herceptin [finished Feb 2017]. On anastrazole.  PET scan- SEP 2021- Small RIGHT subpectoral lymph node with ovoid morphology, activity only slightly greater than baseline mediastinal blood pool is of uncertain significance [DEC TM-WNL; February 2022-tumor marker normal.]  Continue anastrozole x 6 months and then stop.   # HTN- poorly controlled in clinic- systolic BP-160s; at home 120s.   STABLE.    # Intermittently Elevated tumor marker CA 27-29-SEP 44; repeat tumor markers normal.  # History of MALT lymphoma of the lung- clinically no evidence of progression noted; September 2021 PET scan negative-STABLE.   # OSTEOPENIA- JULy 2022-T-score of -1.5; continue Fosamax with vitamin D.'  # port flush every 3 months; keep for now.   # DISPOSITION:  # follow up in 3 months-MD/ labs-cbc/cmp/ ca 27-29/port flush-Dr.B.  Cc;Dr.Tate.      Cammie Sickle, MD 10/10/2020 9:56 PM

## 2020-09-30 LAB — CANCER ANTIGEN 27.29: CA 27.29: 42.6 U/mL — ABNORMAL HIGH (ref 0.0–38.6)

## 2020-10-11 ENCOUNTER — Telehealth: Payer: Self-pay | Admitting: Internal Medicine

## 2020-10-11 NOTE — Telephone Encounter (Signed)
On 08/29-left voicemail for the patient's daughter regarding the slightly elevated tumor marker; will repeat/follow-up in 3 months-as planned.  GB

## 2020-10-12 NOTE — Telephone Encounter (Signed)
Pt called and wanted to know what Dr. B needed when he left a vm. I explained to her that the tumor markers were elevated and but the doctor will recheck the labs again in 3 months. She gave verbal understanding of the plan.

## 2020-10-28 ENCOUNTER — Encounter: Payer: Self-pay | Admitting: Internal Medicine

## 2020-10-28 NOTE — Progress Notes (Signed)
Telephone conversation with patient based upon referral from prior review.   Discussed one-time $1000 [place] and qualifications to assist with personal expenses while undergoing treatment.  Will mail patient [place] agreement and provided patient my contact information for any additional questions.

## 2020-12-22 ENCOUNTER — Other Ambulatory Visit: Payer: Self-pay | Admitting: General Surgery

## 2020-12-22 DIAGNOSIS — C50812 Malignant neoplasm of overlapping sites of left female breast: Secondary | ICD-10-CM

## 2020-12-22 DIAGNOSIS — Z171 Estrogen receptor negative status [ER-]: Secondary | ICD-10-CM

## 2020-12-29 ENCOUNTER — Inpatient Hospital Stay (HOSPITAL_BASED_OUTPATIENT_CLINIC_OR_DEPARTMENT_OTHER): Payer: Medicare HMO | Admitting: Internal Medicine

## 2020-12-29 ENCOUNTER — Inpatient Hospital Stay: Payer: Medicare HMO | Attending: Internal Medicine

## 2020-12-29 ENCOUNTER — Other Ambulatory Visit: Payer: Self-pay

## 2020-12-29 VITALS — BP 151/68 | HR 77 | Temp 97.2°F | Resp 18 | Wt 132.0 lb

## 2020-12-29 DIAGNOSIS — I10 Essential (primary) hypertension: Secondary | ICD-10-CM | POA: Diagnosis not present

## 2020-12-29 DIAGNOSIS — Z79899 Other long term (current) drug therapy: Secondary | ICD-10-CM | POA: Diagnosis not present

## 2020-12-29 DIAGNOSIS — C50812 Malignant neoplasm of overlapping sites of left female breast: Secondary | ICD-10-CM

## 2020-12-29 DIAGNOSIS — Z171 Estrogen receptor negative status [ER-]: Secondary | ICD-10-CM | POA: Insufficient documentation

## 2020-12-29 DIAGNOSIS — Z95828 Presence of other vascular implants and grafts: Secondary | ICD-10-CM

## 2020-12-29 DIAGNOSIS — M8580 Other specified disorders of bone density and structure, unspecified site: Secondary | ICD-10-CM | POA: Diagnosis not present

## 2020-12-29 DIAGNOSIS — Z79811 Long term (current) use of aromatase inhibitors: Secondary | ICD-10-CM | POA: Insufficient documentation

## 2020-12-29 LAB — CBC WITH DIFFERENTIAL/PLATELET
Abs Immature Granulocytes: 0.02 10*3/uL (ref 0.00–0.07)
Basophils Absolute: 0 10*3/uL (ref 0.0–0.1)
Basophils Relative: 0 %
Eosinophils Absolute: 0 10*3/uL (ref 0.0–0.5)
Eosinophils Relative: 1 %
HCT: 38.3 % (ref 36.0–46.0)
Hemoglobin: 12.6 g/dL (ref 12.0–15.0)
Immature Granulocytes: 0 %
Lymphocytes Relative: 34 %
Lymphs Abs: 1.9 10*3/uL (ref 0.7–4.0)
MCH: 30.1 pg (ref 26.0–34.0)
MCHC: 32.9 g/dL (ref 30.0–36.0)
MCV: 91.4 fL (ref 80.0–100.0)
Monocytes Absolute: 0.8 10*3/uL (ref 0.1–1.0)
Monocytes Relative: 15 %
Neutro Abs: 2.8 10*3/uL (ref 1.7–7.7)
Neutrophils Relative %: 50 %
Platelets: 269 10*3/uL (ref 150–400)
RBC: 4.19 MIL/uL (ref 3.87–5.11)
RDW: 13.1 % (ref 11.5–15.5)
WBC: 5.5 10*3/uL (ref 4.0–10.5)
nRBC: 0 % (ref 0.0–0.2)

## 2020-12-29 LAB — COMPREHENSIVE METABOLIC PANEL
ALT: 15 U/L (ref 0–44)
AST: 20 U/L (ref 15–41)
Albumin: 3.9 g/dL (ref 3.5–5.0)
Alkaline Phosphatase: 75 U/L (ref 38–126)
Anion gap: 9 (ref 5–15)
BUN: 13 mg/dL (ref 8–23)
CO2: 28 mmol/L (ref 22–32)
Calcium: 9 mg/dL (ref 8.9–10.3)
Chloride: 100 mmol/L (ref 98–111)
Creatinine, Ser: 0.62 mg/dL (ref 0.44–1.00)
GFR, Estimated: >60 mL/min (ref >60)
Glucose, Bld: 105 mg/dL — ABNORMAL HIGH (ref 70–99)
Potassium: 3.2 mmol/L — ABNORMAL LOW (ref 3.5–5.1)
Sodium: 137 mmol/L (ref 135–145)
Total Bilirubin: 0.6 mg/dL (ref 0.3–1.2)
Total Protein: 7.8 g/dL (ref 6.5–8.1)

## 2020-12-29 MED ORDER — HEPARIN SOD (PORK) LOCK FLUSH 100 UNIT/ML IV SOLN
500.0000 [IU] | Freq: Once | INTRAVENOUS | Status: AC
  Administered 2020-12-29: 500 [IU] via INTRAVENOUS
  Filled 2020-12-29: qty 5

## 2020-12-29 MED ORDER — SODIUM CHLORIDE 0.9% FLUSH
10.0000 mL | Freq: Once | INTRAVENOUS | Status: AC
  Administered 2020-12-29: 10 mL via INTRAVENOUS
  Filled 2020-12-29: qty 10

## 2020-12-29 NOTE — Progress Notes (Signed)
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  Denies any new shortness of breath or cough. No fevers or chills.  No new lumps or bumps.  Review of Systems  Constitutional:  Positive for malaise/fatigue. Negative for chills, diaphoresis and fever.  HENT:  Negative for nosebleeds and sore throat.   Eyes:  Negative for double vision.  Respiratory:  Negative for cough, hemoptysis, sputum production, shortness of breath and wheezing.    Cardiovascular:  Negative for chest pain, palpitations, orthopnea and leg swelling.  Gastrointestinal:  Negative for abdominal pain, blood in stool, constipation, diarrhea, heartburn, melena, nausea and vomiting.  Genitourinary:  Negative for dysuria, frequency and urgency.  Musculoskeletal:  Positive for back pain and joint pain.  Skin: Negative.  Negative for itching and rash.  Neurological:  Negative for dizziness, tingling, focal weakness, weakness and headaches.  Endo/Heme/Allergies:  Does not bruise/bleed easily.  Psychiatric/Behavioral:  Negative for depression. The patient is not nervous/anxious and does not have insomnia.     PAST MEDICAL HISTORY :  Past Medical History:  Diagnosis Date   Breast cancer ([place]) 2015   left breast cancer   Breast cancer metastasized to axillary lymph node ([place]) August 2015   T2, N1, ER negative, PR negative, HER-2 amplified. 2 cm axillary node. One of 11 nodes positive on post-adjuvant chemotherapy axillary dissection. 3+ centimeter tumor.   Hyperlipemia    Hyperlipidemia    Hypertension    Lymphedema of upper extremity following lymphadenectomy August 2015   Left upper extremity.   MALT lymphoma ([place])    Murmur    Osteoporosis    Personal history of chemotherapy     PAST SURGICAL HISTORY :   Past Surgical History:  Procedure Laterality Date   ABDOMINAL HYSTERECTOMY     APPENDECTOMY     BREAST SURGERY Left 02/25/14   Modified radical mastectomy, T2 N1. Grade 3, ER, PR negative, HER-2/neu 3+.   COLONOSCOPY WITH PROPOFOL N/A 01/26/2016   Procedure: COLONOSCOPY WITH PROPOFOL;  Surgeon: [name], MD;  Location: [facility] ENDOSCOPY;  Service: Endoscopy;  Laterality: N/A;   MASTECTOMY  Left 02-25-14   Dr Bary Castilla    FAMILY HISTORY :   Family History  Problem Relation Age of Onset   Hypertension Mother    Hypertension Maternal Aunt    Hypertension Maternal Uncle    Hypertension Maternal Aunt    Hypertension Maternal Aunt    Breast  cancer Neg Hx     SOCIAL HISTORY:   Social History   Tobacco Use   Smoking status: Never   Smokeless tobacco: Never  Substance Use Topics   Alcohol use: No   Drug use: No    ALLERGIES:  has No Known Allergies.  MEDICATIONS:  Current Outpatient Medications  Medication Sig Dispense Refill   alendronate (FOSAMAX) 70 MG tablet Take 70 mg by mouth once a week. Take with a full glass of water on an empty stomach.     amLODipine (NORVASC) 2.5 MG tablet Take 2.5 mg by mouth daily.     anastrozole (ARIMIDEX) 1 MG tablet TAKE ONE TABLET BY MOUTH ONCE DAILY 90 tablet 3   aspirin 81 MG tablet Take 81 mg by mouth daily.     cholecalciferol (VITAMIN D) 1000 UNITS tablet Take 1,000 Units by mouth daily.     losartan-hydrochlorothiazide (HYZAAR) 100-25 MG per tablet Take 1 tablet by mouth daily.     Multiple Vitamins-Minerals (MULTIVITAMIN WITH MINERALS) tablet Take 1 tablet by mouth daily.     simvastatin (ZOCOR) 40 MG tablet Take 40 mg by mouth at bedtime.     No current facility-administered medications for this visit.   Facility-Administered Medications Ordered in Other Visits  Medication Dose Route Frequency Provider Last Rate Last Admin   sodium chloride flush (NS) 0.9 % injection 10 mL  10 mL Intravenous PRN [name] R, MD   10 mL at 10/19/16 1054   sodium chloride flush (NS) 0.9 % injection 10 mL  10 mL Intravenous PRN Cammie Sickle, MD   10 mL at 12/13/17 1050    PHYSICAL EXAMINATION: ECOG PERFORMANCE STATUS: 0 - Asymptomatic  BP (!) 151/68   Pulse 77   Temp (!) 97.2 F (36.2 C) (Tympanic)   Resp 18   Wt 132 lb (59.9 kg)   SpO2 100%   BMI 25.78 kg/m   Filed Weights   12/29/20 1011  Weight: 132 lb (59.9 kg)    Physical Exam Constitutional:      Comments: Frail-appearing female patient.  She is walking herself.  Alone.  HENT:     Head: Normocephalic and atraumatic.     Mouth/Throat:     Pharynx: No oropharyngeal exudate.  Eyes:     Pupils:  Pupils are equal, round, and reactive to light.  Cardiovascular:     Rate and Rhythm: Normal rate and regular rhythm.  Pulmonary:     Effort: No respiratory distress.     Breath sounds: No wheezing.  Abdominal:     General: Bowel sounds are normal. There is no distension.     Palpations: Abdomen is soft. There is no mass.     Tenderness: There is no abdominal tenderness. There is no guarding or rebound.  Musculoskeletal:        General: No tenderness. Normal range of motion.     Cervical back: Normal range of motion and neck supple.  Skin:    General: Skin is warm.  Neurological:     Mental Status: She is alert and oriented to person, place, and time.  Psychiatric:        Mood and  Affect: Affect normal.     LABORATORY DATA:  I have reviewed the data as listed    Component Value Date/Time   NA 137 12/29/2020 0959   NA 136 05/27/2014 0942   K 3.2 (L) 12/29/2020 0959   K 3.4 (L) 05/27/2014 0942   CL 100 12/29/2020 0959   CL 102 05/27/2014 0942   CO2 28 12/29/2020 0959   CO2 29 05/27/2014 0942   GLUCOSE 105 (H) 12/29/2020 0959   GLUCOSE 106 (H) 05/27/2014 0942   BUN 13 12/29/2020 0959   BUN 13 05/27/2014 0942   CREATININE 0.62 12/29/2020 0959   CREATININE 0.58 05/27/2014 0942   CALCIUM 9.0 12/29/2020 0959   CALCIUM 8.6 (L) 05/27/2014 0942   PROT 7.8 12/29/2020 0959   PROT 6.8 05/27/2014 0942   ALBUMIN 3.9 12/29/2020 0959   ALBUMIN 3.8 05/27/2014 0942   AST 20 12/29/2020 0959   AST 18 05/27/2014 0942   ALT 15 12/29/2020 0959   ALT 14 05/27/2014 0942   ALKPHOS 75 12/29/2020 0959   ALKPHOS 69 05/27/2014 0942   BILITOT 0.6 12/29/2020 0959   BILITOT 0.6 05/27/2014 0942   GFRNONAA >60 12/29/2020 0959   GFRNONAA >60 05/27/2014 0942   GFRAA >60 10/22/2019 1457   GFRAA >60 05/27/2014 0942    No results found for: SPEP, UPEP  Lab Results  Component Value Date   WBC 5.5 12/29/2020   NEUTROABS 2.8 12/29/2020   HGB 12.6 12/29/2020   HCT 38.3 12/29/2020   MCV 91.4  12/29/2020   PLT 269 12/29/2020      Chemistry      Component Value Date/Time   NA 137 12/29/2020 0959   NA 136 05/27/2014 0942   K 3.2 (L) 12/29/2020 0959   K 3.4 (L) 05/27/2014 0942   CL 100 12/29/2020 0959   CL 102 05/27/2014 0942   CO2 28 12/29/2020 0959   CO2 29 05/27/2014 0942   BUN 13 12/29/2020 0959   BUN 13 05/27/2014 0942   CREATININE 0.62 12/29/2020 0959   CREATININE 0.58 05/27/2014 0942      Component Value Date/Time   CALCIUM 9.0 12/29/2020 0959   CALCIUM 8.6 (L) 05/27/2014 0942   ALKPHOS 75 12/29/2020 0959   ALKPHOS 69 05/27/2014 0942   AST 20 12/29/2020 0959   AST 18 05/27/2014 0942   ALT 15 12/29/2020 0959   ALT 14 05/27/2014 0942   BILITOT 0.6 12/29/2020 0959   BILITOT 0.6 05/27/2014 0942       ASSESSMENT & PLAN:   Carcinoma of overlapping sites of left breast in female, estrogen receptor negative ([place]) # 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 On anastrazole.  PET scan- SEP 2021- Small RIGHT subpectoral lymph node with ovoid morphology, activity only slightly greater than baseline mediastinal blood pool is of uncertain significance  Continue anastrozole x 6 months [until FEB 2023] and then stop.   # HTN- poorly controlled in clinic- systolic [id number]; at home 120s.   STABLE.    # Intermittently Elevated tumor marker CA 27-29-SEP 44; await repeat tumor marker testing.  # History of MALT lymphoma of the lung- clinically no evidence of progression noted; September 2021 PET scan negative-STABLE.   # OSTEOPENIA- JULy 2022-T-score of -1.5; continue Fosamax with vitamin D.'  # port flush every 3 months; keep for now.   # DISPOSITION:  # follow up in 3 months-MD/ labs-cbc/cmp/ ca 27-29/port flush-Dr.B.   Cc;Dr.Tate.  Cammie Sickle, MD 12/29/2020 11:12 AM

## 2020-12-29 NOTE — Assessment & Plan Note (Addendum)
#  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 On anastrazole.  PET scan- SEP 2021- Small RIGHT subpectoral lymph node with ovoid morphology, activity only slightly greater than baseline mediastinal blood pool is of uncertain significance  Continue anastrozole x 6 months [until FEB 2023] and then stop.   # HTN- poorly controlled in clinic- systolic [id number]; at home 120s.   STABLE.    # Intermittently Elevated tumor marker CA 27-29-SEP 44; await repeat tumor marker testing.  # History of MALT lymphoma of the lung- clinically no evidence of progression noted; September 2021 PET scan negative-STABLE.   # OSTEOPENIA- JULy 2022-T-score of -1.5; continue Fosamax with vitamin D.'  # port flush every 3 months; keep for now.   # DISPOSITION:  # follow up in 3 months-MD/ labs-cbc/cmp/ ca 27-29/port flush-Dr.B.   Cc;Dr.Tate.

## 2020-12-30 LAB — CANCER ANTIGEN 27.29: CA 27.29: 47 U/mL — ABNORMAL HIGH (ref 0.0–38.6)

## 2021-01-03 ENCOUNTER — Telehealth: Payer: Self-pay | Admitting: *Deleted

## 2021-01-03 ENCOUNTER — Encounter: Payer: Self-pay | Admitting: Internal Medicine

## 2021-01-03 NOTE — Telephone Encounter (Signed)
Daughter returned Dr. Andre Lefort called regarding patient's elevated tumor marker. She is having trouble receiving calls from the [facility] but said she will watch her phone today for a call back.

## 2021-01-03 NOTE — Progress Notes (Signed)
I spoke to patient's daughter regarding the results of the abnormal tumor marker. Discuss regarding a CT scan for further evaluation. The daughter wants to have the CT scan done I did a Tuesday or Wednesday after Christmas.  C- Please schedule.

## 2021-01-04 ENCOUNTER — Other Ambulatory Visit: Payer: Self-pay | Admitting: Internal Medicine

## 2021-01-04 DIAGNOSIS — Z171 Estrogen receptor negative status [ER-]: Secondary | ICD-10-CM

## 2021-01-04 DIAGNOSIS — C50812 Malignant neoplasm of overlapping sites of left female breast: Secondary | ICD-10-CM

## 2021-01-04 NOTE — Progress Notes (Signed)
C-please schedule the CT scan in Monday or Tuesday after Christmas as per patient's daughter's request.  Thanks GB

## 2021-01-31 ENCOUNTER — Other Ambulatory Visit: Payer: Self-pay

## 2021-01-31 ENCOUNTER — Ambulatory Visit
Admission: RE | Admit: 2021-01-31 | Discharge: 2021-01-31 | Disposition: A | Discharge status: Home / Self Care | Payer: Medicare HMO | Source: Ambulatory Visit | Attending: General Surgery | Admitting: General Surgery

## 2021-01-31 DIAGNOSIS — C50812 Malignant neoplasm of overlapping sites of left female breast: Secondary | ICD-10-CM | POA: Diagnosis present

## 2021-01-31 DIAGNOSIS — Z171 Estrogen receptor negative status [ER-]: Secondary | ICD-10-CM

## 2021-01-31 DIAGNOSIS — Z1231 Encounter for screening mammogram for malignant neoplasm of breast: Secondary | ICD-10-CM | POA: Diagnosis not present

## 2021-02-22 ENCOUNTER — Inpatient Hospital Stay: Payer: Medicare HMO | Attending: Internal Medicine

## 2021-02-22 ENCOUNTER — Ambulatory Visit
Admission: RE | Admit: 2021-02-22 | Discharge: 2021-02-22 | Disposition: A | Discharge status: Home / Self Care | Payer: Medicare HMO | Source: Ambulatory Visit | Attending: Internal Medicine | Admitting: Internal Medicine

## 2021-02-22 ENCOUNTER — Other Ambulatory Visit: Payer: Self-pay

## 2021-02-22 ENCOUNTER — Telehealth: Payer: Self-pay | Admitting: Internal Medicine

## 2021-02-22 DIAGNOSIS — Z79899 Other long term (current) drug therapy: Secondary | ICD-10-CM | POA: Insufficient documentation

## 2021-02-22 DIAGNOSIS — C50812 Malignant neoplasm of overlapping sites of left female breast: Secondary | ICD-10-CM | POA: Diagnosis not present

## 2021-02-22 DIAGNOSIS — M1990 Unspecified osteoarthritis, unspecified site: Secondary | ICD-10-CM | POA: Insufficient documentation

## 2021-02-22 DIAGNOSIS — Z171 Estrogen receptor negative status [ER-]: Secondary | ICD-10-CM | POA: Diagnosis present

## 2021-02-22 DIAGNOSIS — K5730 Diverticulosis of large intestine without perforation or abscess without bleeding: Secondary | ICD-10-CM | POA: Diagnosis not present

## 2021-02-22 DIAGNOSIS — N8110 Cystocele, unspecified: Secondary | ICD-10-CM | POA: Insufficient documentation

## 2021-02-22 DIAGNOSIS — I700 Atherosclerosis of aorta: Secondary | ICD-10-CM | POA: Insufficient documentation

## 2021-02-22 DIAGNOSIS — Z8579 Personal history of other malignant neoplasms of lymphoid, hematopoietic and related tissues: Secondary | ICD-10-CM | POA: Insufficient documentation

## 2021-02-22 DIAGNOSIS — Z79811 Long term (current) use of aromatase inhibitors: Secondary | ICD-10-CM | POA: Insufficient documentation

## 2021-02-22 DIAGNOSIS — Z95828 Presence of other vascular implants and grafts: Secondary | ICD-10-CM

## 2021-02-22 LAB — CBC WITH DIFFERENTIAL/PLATELET
Abs Immature Granulocytes: 0 10*3/uL (ref 0.00–0.07)
Basophils Absolute: 0 10*3/uL (ref 0.0–0.1)
Basophils Relative: 0 %
Eosinophils Absolute: 0 10*3/uL (ref 0.0–0.5)
Eosinophils Relative: 1 %
HCT: 37.9 % (ref 36.0–46.0)
Hemoglobin: 12.6 g/dL (ref 12.0–15.0)
Immature Granulocytes: 0 %
Lymphocytes Relative: 37 %
Lymphs Abs: 1.7 10*3/uL (ref 0.7–4.0)
MCH: 30.5 pg (ref 26.0–34.0)
MCHC: 33.2 g/dL (ref 30.0–36.0)
MCV: 91.8 fL (ref 80.0–100.0)
Monocytes Absolute: 0.7 10*3/uL (ref 0.1–1.0)
Monocytes Relative: 15 %
Neutro Abs: 2.2 10*3/uL (ref 1.7–7.7)
Neutrophils Relative %: 47 %
Platelets: 232 10*3/uL (ref 150–400)
RBC: 4.13 MIL/uL (ref 3.87–5.11)
RDW: 12.9 % (ref 11.5–15.5)
WBC: 4.7 10*3/uL (ref 4.0–10.5)
nRBC: 0 % (ref 0.0–0.2)

## 2021-02-22 LAB — COMPREHENSIVE METABOLIC PANEL
ALT: 15 U/L (ref 0–44)
AST: 20 U/L (ref 15–41)
Albumin: 4 g/dL (ref 3.5–5.0)
Alkaline Phosphatase: 66 U/L (ref 38–126)
Anion gap: 6 (ref 5–15)
BUN: 10 mg/dL (ref 8–23)
CO2: 28 mmol/L (ref 22–32)
Calcium: 8.4 mg/dL — ABNORMAL LOW (ref 8.9–10.3)
Chloride: 100 mmol/L (ref 98–111)
Creatinine, Ser: 0.62 mg/dL (ref 0.44–1.00)
GFR, Estimated: >60 mL/min (ref >60)
Glucose, Bld: 101 mg/dL — ABNORMAL HIGH (ref 70–99)
Potassium: 3.2 mmol/L — ABNORMAL LOW (ref 3.5–5.1)
Sodium: 134 mmol/L — ABNORMAL LOW (ref 135–145)
Total Bilirubin: 0.7 mg/dL (ref 0.3–1.2)
Total Protein: 7.5 g/dL (ref 6.5–8.1)

## 2021-02-22 MED ORDER — HEPARIN SOD (PORK) LOCK FLUSH 100 UNIT/ML IV SOLN
500.0000 [IU] | Freq: Once | INTRAVENOUS | Status: AC
  Administered 2021-02-22: 500 [IU] via INTRAVENOUS
  Filled 2021-02-22: qty 5

## 2021-02-22 MED ORDER — IOHEXOL 350 MG/ML SOLN
80.0000 mL | Freq: Once | INTRAVENOUS | Status: AC | PRN
  Administered 2021-02-22: 80 mL via INTRAVENOUS

## 2021-02-22 MED ORDER — SODIUM CHLORIDE 0.9% FLUSH
10.0000 mL | INTRAVENOUS | Status: DC | PRN
  Administered 2021-02-22: 10 mL via INTRAVENOUS
  Filled 2021-02-22: qty 10

## 2021-02-22 NOTE — Telephone Encounter (Signed)
Spoke with patient's daughter, Tempie, who is concerned with her mother worrying about the CT results and waiting until MD appt on 1/16 to discuss.  Patient's daughter request that she be called with the results so she can prepare her for the MD appt and not be to anxious waiting until 1/16 for CT results.

## 2021-02-22 NOTE — Telephone Encounter (Signed)
Daughter wants to talk to nurse about pt. Call back at [phone]

## 2021-02-23 ENCOUNTER — Encounter: Payer: Self-pay | Admitting: Internal Medicine

## 2021-02-23 LAB — CANCER ANTIGEN 27.29: CA 27.29: 49.6 U/mL — ABNORMAL HIGH (ref 0.0–38.6)

## 2021-02-23 NOTE — Progress Notes (Signed)
I spoke to patient daughter regarding the results of the CT scan/slightly elevated Cancer marker. Will review for the next visit/next week. Will also plan discussing the imaging at case conference.

## 2021-02-28 ENCOUNTER — Encounter: Payer: Self-pay | Admitting: Internal Medicine

## 2021-02-28 ENCOUNTER — Inpatient Hospital Stay (HOSPITAL_BASED_OUTPATIENT_CLINIC_OR_DEPARTMENT_OTHER): Payer: Medicare HMO | Admitting: Internal Medicine

## 2021-02-28 ENCOUNTER — Other Ambulatory Visit: Payer: Self-pay

## 2021-02-28 VITALS — BP 154/71 | HR 89 | Temp 98.1°F | Wt 132.2 lb

## 2021-02-28 DIAGNOSIS — Z171 Estrogen receptor negative status [ER-]: Secondary | ICD-10-CM | POA: Diagnosis not present

## 2021-02-28 DIAGNOSIS — C50812 Malignant neoplasm of overlapping sites of left female breast: Secondary | ICD-10-CM | POA: Diagnosis not present

## 2021-02-28 DIAGNOSIS — C884 Extranodal marginal zone B-cell lymphoma of mucosa-associated lymphoid tissue [MALT-lymphoma]: Secondary | ICD-10-CM | POA: Diagnosis not present

## 2021-02-28 NOTE — Progress Notes (Signed)
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  Patient is quite anxious.  However denies any nausea vomiting headache. Denies any new shortness of breath or cough. No fevers or chills.  No new lumps or bumps.  Review of Systems  Constitutional:  Positive for malaise/fatigue. Negative for chills, diaphoresis and fever.  HENT:  Negative for nosebleeds and sore throat.   Eyes:  Negative for double vision.   Respiratory:  Negative for cough, hemoptysis, sputum production, shortness of breath and wheezing.   Cardiovascular:  Negative for chest pain, palpitations, orthopnea and leg swelling.  Gastrointestinal:  Negative for abdominal pain, blood in stool, constipation, diarrhea, heartburn, melena, nausea and vomiting.  Genitourinary:  Negative for dysuria, frequency and urgency.  Musculoskeletal:  Positive for back pain and joint pain.  Skin: Negative.  Negative for itching and rash.  Neurological:  Negative for dizziness, tingling, focal weakness, weakness and headaches.  Endo/Heme/Allergies:  Does not bruise/bleed easily.  Psychiatric/Behavioral:  Negative for depression. The patient is not nervous/anxious and does not have insomnia.     PAST MEDICAL HISTORY :  Past Medical History:  Diagnosis Date   Breast cancer ([place]) 2015   left breast cancer   Breast cancer metastasized to axillary lymph node ([place]) August 2015   T2, N1, ER negative, PR negative, HER-2 amplified. 2 cm axillary node. One of 11 nodes positive on post-adjuvant chemotherapy axillary dissection. 3+ centimeter tumor.   Hyperlipemia    Hyperlipidemia    Hypertension    Lymphedema of upper extremity following lymphadenectomy August 2015   Left upper extremity.   MALT lymphoma ([place])    Murmur    Osteoporosis    Personal history of chemotherapy     PAST SURGICAL HISTORY :   Past Surgical History:  Procedure Laterality Date   ABDOMINAL HYSTERECTOMY     APPENDECTOMY     BREAST SURGERY Left 02/25/14   Modified radical mastectomy, T2 N1. Grade 3, ER, PR negative, HER-2/neu 3+.   COLONOSCOPY WITH PROPOFOL N/A 01/26/2016   Procedure: COLONOSCOPY WITH PROPOFOL;  Surgeon: Dellis Filbert  Amedeo Kinsman, MD;  Location: [facility] ENDOSCOPY;  Service: Endoscopy;  Laterality: N/A;   MASTECTOMY Left 02-25-14   Dr Bary Castilla    FAMILY HISTORY :   Family History  Problem Relation Age of Onset   Hypertension Mother    Hypertension Maternal Aunt     Hypertension Maternal Uncle    Hypertension Maternal Aunt    Hypertension Maternal Aunt    Breast cancer Neg Hx     SOCIAL HISTORY:   Social History   Tobacco Use   Smoking status: Never   Smokeless tobacco: Never  Vaping Use   Vaping Use: Never used  Substance Use Topics   Alcohol use: No   Drug use: No    ALLERGIES:  has No Known Allergies.  MEDICATIONS:  Current Outpatient Medications  Medication Sig Dispense Refill   alendronate (FOSAMAX) 70 MG tablet Take 70 mg by mouth once a week. Take with a full glass of water on an empty stomach.     amLODipine (NORVASC) 2.5 MG tablet Take 2.5 mg by mouth daily.     anastrozole (ARIMIDEX) 1 MG tablet TAKE ONE TABLET BY MOUTH ONCE DAILY 90 tablet 3   aspirin 81 MG tablet Take 81 mg by mouth daily.     cholecalciferol (VITAMIN D) 1000 UNITS tablet Take 1,000 Units by mouth daily.     losartan-hydrochlorothiazide (HYZAAR) 100-25 MG per tablet Take 1 tablet by mouth daily.     Multiple Vitamins-Minerals (MULTIVITAMIN WITH MINERALS) tablet Take 1 tablet by mouth daily.     potassium chloride SA (KLOR-CON M) 20 MEQ tablet Take 20 mEq by mouth daily.     simvastatin (ZOCOR) 40 MG tablet Take 40 mg by mouth at bedtime.     No current facility-administered medications for this visit.   Facility-Administered Medications Ordered in Other Visits  Medication Dose Route Frequency Provider Last Rate Last Admin   sodium chloride flush (NS) 0.9 % injection 10 mL  10 mL Intravenous PRN Cammie Sickle, MD   10 mL at 10/19/16 1054   sodium chloride flush (NS) 0.9 % injection 10 mL  10 mL Intravenous PRN Cammie Sickle, MD   10 mL at 12/13/17 1050    PHYSICAL EXAMINATION: ECOG PERFORMANCE STATUS: 0 - Asymptomatic  BP (!) 154/71 (BP Location: Right Arm, Patient Position: Sitting, Cuff Size: Normal)    Pulse 89    Temp 98.1 F (36.7 C) (Tympanic)    Wt 132 lb 3.2 oz (60 kg)    SpO2 100%    BMI 25.82 kg/m   Filed Weights   02/28/21  0859  Weight: 132 lb 3.2 oz (60 kg)    Physical Exam Constitutional:      Comments: Frail-appearing female patient.  She is walking herself.  Alone.  HENT:     Head: Normocephalic and atraumatic.     Mouth/Throat:     Pharynx: No oropharyngeal exudate.  Eyes:     Pupils: Pupils are equal, round, and reactive to light.  Cardiovascular:     Rate and Rhythm: Normal rate and regular rhythm.  Pulmonary:     Effort: No respiratory distress.     Breath sounds: No wheezing.  Abdominal:     General: Bowel sounds are normal. There is no distension.     Palpations: Abdomen is soft. There is no mass.     Tenderness: There is no abdominal tenderness. There is no guarding or rebound.  Musculoskeletal:        General:  No tenderness. Normal range of motion.     Cervical back: Normal range of motion and neck supple.  Skin:    General: Skin is warm.  Neurological:     Mental Status: She is alert and oriented to person, place, and time.  Psychiatric:        Mood and Affect: Affect normal.     LABORATORY DATA:  I have reviewed the data as listed    Component Value Date/Time   NA 134 (L) 02/22/2021 0909   NA 136 05/27/2014 0942   K 3.2 (L) 02/22/2021 0909   K 3.4 (L) 05/27/2014 0942   CL 100 02/22/2021 0909   CL 102 05/27/2014 0942   CO2 28 02/22/2021 0909   CO2 29 05/27/2014 0942   GLUCOSE 101 (H) 02/22/2021 0909   GLUCOSE 106 (H) 05/27/2014 0942   BUN 10 02/22/2021 0909   BUN 13 05/27/2014 0942   CREATININE 0.62 02/22/2021 0909   CREATININE 0.58 05/27/2014 0942   CALCIUM 8.4 (L) 02/22/2021 0909   CALCIUM 8.6 (L) 05/27/2014 0942   PROT 7.5 02/22/2021 0909   PROT 6.8 05/27/2014 0942   ALBUMIN 4.0 02/22/2021 0909   ALBUMIN 3.8 05/27/2014 0942   AST 20 02/22/2021 0909   AST 18 05/27/2014 0942   ALT 15 02/22/2021 0909   ALT 14 05/27/2014 0942   ALKPHOS 66 02/22/2021 0909   ALKPHOS 69 05/27/2014 0942   BILITOT 0.7 02/22/2021 0909   BILITOT 0.6 05/27/2014 0942   GFRNONAA >60  02/22/2021 0909   GFRNONAA >60 05/27/2014 0942   GFRAA >60 10/22/2019 1457   GFRAA >60 05/27/2014 0942    No results found for: SPEP, UPEP  Lab Results  Component Value Date   WBC 4.7 02/22/2021   NEUTROABS 2.2 02/22/2021   HGB 12.6 02/22/2021   HCT 37.9 02/22/2021   MCV 91.8 02/22/2021   PLT 232 02/22/2021      Chemistry      Component Value Date/Time   NA 134 (L) 02/22/2021 0909   NA 136 05/27/2014 0942   K 3.2 (L) 02/22/2021 0909   K 3.4 (L) 05/27/2014 0942   CL 100 02/22/2021 0909   CL 102 05/27/2014 0942   CO2 28 02/22/2021 0909   CO2 29 05/27/2014 0942   BUN 10 02/22/2021 0909   BUN 13 05/27/2014 0942   CREATININE 0.62 02/22/2021 0909   CREATININE 0.58 05/27/2014 0942      Component Value Date/Time   CALCIUM 8.4 (L) 02/22/2021 0909   CALCIUM 8.6 (L) 05/27/2014 0942   ALKPHOS 66 02/22/2021 0909   ALKPHOS 69 05/27/2014 0942   AST 20 02/22/2021 0909   AST 18 05/27/2014 0942   ALT 15 02/22/2021 0909   ALT 14 05/27/2014 0942   BILITOT 0.7 02/22/2021 0909   BILITOT 0.6 05/27/2014 0942       ASSESSMENT & PLAN:   Carcinoma of overlapping sites of left breast in female, estrogen receptor negative ([place]) # 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 On anastrazole. CT scan- JAN 2023-stable/0.7 cm subpectoral lymph node.  Continue anastrozole for now.  See discussion below regarding PET scan-  #Right upper lobe/middle lobe- chronic airspace opacities in the right upper lobe and right middle lobe, with mild increase in plugging in associated bronchiectatic airways, and a mild increase in the amount of airspace opacity in the right upper lobe and right middle lobe which could be inflammatory or due  to progression of MALT lymphoma.  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  Ordered a PET scan for further evaluation/regarding  question progression of disease.  Patient is asymptomatic.  We will also review at the tumor conference.   # HTN- poorly controlled in clinic- systolic [id number]; at home 120s.   STABLE.    # Intermittently Elevated tumor marker CA 27-29-SEP 44; await repeat tumor marker testing.   # OSTEOPENIA- JULy 2022-T-score of -1.5; continue Fosamax with vitamin D.'  Stable.  # port flush every 3 months; keep for now.   #Incidental findings on CT Imaging dated:JAN 13th, 2023: Moderate arteriosclerosis/aortic atherosclerosis/coronary disease left adrenal adenoma cystocele/pelvic floor laxity colonic diverticulosis/degenerative arthritis I reviewed/discussed/counseled the patient.   # DISPOSITION:  # PET ASAP # follow up in 3 months-MD/ labs-cbc/cmp/ ca 27-29/port flush-Dr.B.   # I reviewed the blood work- with the patient in detail; also reviewed the imaging independently [as summarized above]; and with the patient in detail.    Cc;Dr.Tate.      Cammie Sickle, MD 02/28/2021 9:48 PM

## 2021-02-28 NOTE — Assessment & Plan Note (Addendum)
#  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 On anastrazole. CT scan- JAN 2023-stable/0.7 cm subpectoral lymph node.  Continue anastrozole for now.  See discussion below regarding PET scan-  #Right upper lobe/middle lobe- chronic airspace opacities in the right upper lobe and right middle lobe, with mild increase in plugging in associated bronchiectatic airways, and a mild increase in the amount of airspace opacity in the right upper lobe and right middle lobe which could be inflammatory or due to progression of MALT lymphoma.  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  Ordered a PET scan for further evaluation/regarding question progression of disease.  Patient is asymptomatic.  We will also review at the tumor conference.  # HTN- poorly controlled in clinic- systolic [id number]; at home 120s.   STABLE.    # Intermittently Elevated tumor marker CA 27-29-SEP 44; await repeat tumor marker testing.   # OSTEOPENIA- JULy 2022-T-score of -1.5; continue Fosamax with vitamin D.'  Stable.  # port flush every 3 months; keep for now.   #Incidental findings on CT Imaging dated:JAN 13th, 2023: Moderate arteriosclerosis/aortic atherosclerosis/coronary disease left adrenal adenoma cystocele/pelvic floor laxity colonic diverticulosis/degenerative arthritis I reviewed/discussed/counseled the patient.   # DISPOSITION:  # PET ASAP # follow up in 3 months-MD/ labs-cbc/cmp/ ca 27-29/port flush-Dr.B.   # I reviewed the blood work- with the patient in detail; also reviewed the imaging independently [as summarized above]; and with the patient in detail.    Cc;Dr.Tate.

## 2021-03-02 ENCOUNTER — Encounter
Admission: RE | Admit: 2021-03-02 | Discharge: 2021-03-02 | Disposition: A | Discharge status: Home / Self Care | Payer: Medicare HMO | Source: Ambulatory Visit | Attending: Internal Medicine | Admitting: Internal Medicine

## 2021-03-02 ENCOUNTER — Other Ambulatory Visit: Payer: Self-pay

## 2021-03-02 DIAGNOSIS — C884 Extranodal marginal zone B-cell lymphoma of mucosa-associated lymphoid tissue [MALT-lymphoma]: Secondary | ICD-10-CM | POA: Diagnosis present

## 2021-03-02 LAB — GLUCOSE, CAPILLARY: Glucose-Capillary: 102 mg/dL — ABNORMAL HIGH (ref 70–99)

## 2021-03-02 MED ORDER — FLUDEOXYGLUCOSE F - 18 (FDG) INJECTION
6.8000 | Freq: Once | INTRAVENOUS | Status: AC | PRN
  Administered 2021-03-02: 7.28 via INTRAVENOUS

## 2021-03-03 ENCOUNTER — Other Ambulatory Visit: Payer: Medicare HMO

## 2021-03-03 NOTE — Progress Notes (Signed)
Tumor Board Documentation  Andrea Rodgers was presented by Dr Rogue Bussing at our Tumor Board on 03/03/2021, which included representatives from medical oncology, pathology, radiology, surgical, pharmacy, pulmonology, genetics, nutrition, navigation, research, internal medicine, palliative care.  Andrea Rodgers currently presents as a current patient, for discussion with history of the following treatments: surgical intervention(s), adjuvant chemotherapy, active survellience.  Additionally, we reviewed previous medical and familial history, history of present illness, and recent lab results along with all available histopathologic and imaging studies. The tumor board considered available treatment options and made the following recommendations: Active surveillance    The following procedures/referrals were also placed: No orders of the defined types were placed in this encounter.   Clinical Trial Status: not discussed   Staging used: AJCC Stage Group AJCC Staging: T: p2 N: 1   Group: Stage IIB Breast Cancer, Stage IE MALT Lymphoma   National site-specific guidelines NCCN were discussed with respect to the case.  Tumor board is a meeting of clinicians from various specialty areas who evaluate and discuss patients for whom a multidisciplinary approach is being considered. Final determinations in the plan of care are those of the provider(s). The responsibility for follow up of recommendations given during tumor board is that of the provider.   Todays extended care, comprehensive team conference, Andrea Rodgers was not present for the discussion and was not examined.   Multidisciplinary Tumor Board is a multidisciplinary case peer review process.  Decisions discussed in the Multidisciplinary Tumor Board reflect the opinions of the specialists present at the conference without having examined the patient.  Ultimately, treatment and diagnostic decisions rest with the primary provider(s) and the patient.

## 2021-03-04 ENCOUNTER — Encounter: Payer: Self-pay | Admitting: Internal Medicine

## 2021-03-04 ENCOUNTER — Other Ambulatory Visit: Payer: Self-pay | Admitting: Internal Medicine

## 2021-03-04 DIAGNOSIS — Z171 Estrogen receptor negative status [ER-]: Secondary | ICD-10-CM

## 2021-03-04 DIAGNOSIS — C50812 Malignant neoplasm of overlapping sites of left female breast: Secondary | ICD-10-CM

## 2021-03-04 NOTE — Progress Notes (Signed)
I spoke to patients daughter regarding the results of the pet scan/discussion at the tumor conference. PET scan does not show significant worsening of the last six years although mild progression. 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 Recommend evaluation with pulmonary. Will make a referral.

## 2021-03-04 NOTE — Progress Notes (Signed)
Please make a referral to see Dr.Aleskerov re: right upper lobe consolidation/history of MALT lymphoma.   Follow-up with me as planned

## 2021-03-04 NOTE — Progress Notes (Signed)
Referral faxed

## 2021-03-04 NOTE — Addendum Note (Signed)
Addended by: Vanice Sarah on: 03/04/2021 01:00 PM   Modules accepted: Orders

## 2021-03-08 NOTE — Progress Notes (Signed)
Records have been faxed for referral again.

## 2021-03-30 ENCOUNTER — Ambulatory Visit: Payer: Medicare HMO | Admitting: Internal Medicine

## 2021-03-30 ENCOUNTER — Other Ambulatory Visit: Payer: Medicare HMO

## 2021-05-31 ENCOUNTER — Inpatient Hospital Stay: Payer: Medicare HMO | Attending: Internal Medicine

## 2021-05-31 ENCOUNTER — Encounter: Payer: Self-pay | Admitting: Medical Oncology

## 2021-05-31 ENCOUNTER — Inpatient Hospital Stay (HOSPITAL_BASED_OUTPATIENT_CLINIC_OR_DEPARTMENT_OTHER): Payer: Medicare HMO | Admitting: Medical Oncology

## 2021-05-31 VITALS — BP 181/68 | HR 89 | Temp 98.7°F | Resp 19 | Wt 130.0 lb

## 2021-05-31 DIAGNOSIS — Z9049 Acquired absence of other specified parts of digestive tract: Secondary | ICD-10-CM | POA: Insufficient documentation

## 2021-05-31 DIAGNOSIS — Z8249 Family history of ischemic heart disease and other diseases of the circulatory system: Secondary | ICD-10-CM | POA: Diagnosis not present

## 2021-05-31 DIAGNOSIS — Z5181 Encounter for therapeutic drug level monitoring: Secondary | ICD-10-CM

## 2021-05-31 DIAGNOSIS — R030 Elevated blood-pressure reading, without diagnosis of hypertension: Secondary | ICD-10-CM

## 2021-05-31 DIAGNOSIS — Z9221 Personal history of antineoplastic chemotherapy: Secondary | ICD-10-CM | POA: Diagnosis not present

## 2021-05-31 DIAGNOSIS — C884 Extranodal marginal zone B-cell lymphoma of mucosa-associated lymphoid tissue [MALT-lymphoma]: Secondary | ICD-10-CM | POA: Diagnosis present

## 2021-05-31 DIAGNOSIS — Z803 Family history of malignant neoplasm of breast: Secondary | ICD-10-CM | POA: Insufficient documentation

## 2021-05-31 DIAGNOSIS — C50812 Malignant neoplasm of overlapping sites of left female breast: Secondary | ICD-10-CM

## 2021-05-31 DIAGNOSIS — M549 Dorsalgia, unspecified: Secondary | ICD-10-CM | POA: Diagnosis not present

## 2021-05-31 DIAGNOSIS — M2550 Pain in unspecified joint: Secondary | ICD-10-CM | POA: Diagnosis not present

## 2021-05-31 DIAGNOSIS — Z79811 Long term (current) use of aromatase inhibitors: Secondary | ICD-10-CM

## 2021-05-31 DIAGNOSIS — Z95828 Presence of other vascular implants and grafts: Secondary | ICD-10-CM

## 2021-05-31 DIAGNOSIS — Z171 Estrogen receptor negative status [ER-]: Secondary | ICD-10-CM | POA: Diagnosis not present

## 2021-05-31 DIAGNOSIS — Z79899 Other long term (current) drug therapy: Secondary | ICD-10-CM | POA: Diagnosis not present

## 2021-05-31 DIAGNOSIS — R232 Flushing: Secondary | ICD-10-CM | POA: Insufficient documentation

## 2021-05-31 DIAGNOSIS — R978 Other abnormal tumor markers: Secondary | ICD-10-CM

## 2021-05-31 LAB — CBC WITH DIFFERENTIAL/PLATELET
Abs Immature Granulocytes: 0.01 10*3/uL (ref 0.00–0.07)
Basophils Absolute: 0 10*3/uL (ref 0.0–0.1)
Basophils Relative: 1 %
Eosinophils Absolute: 0 10*3/uL (ref 0.0–0.5)
Eosinophils Relative: 1 %
HCT: 38 % (ref 36.0–46.0)
Hemoglobin: 12.5 g/dL (ref 12.0–15.0)
Immature Granulocytes: 0 %
Lymphocytes Relative: 38 %
Lymphs Abs: 2.1 10*3/uL (ref 0.7–4.0)
MCH: 30.3 pg (ref 26.0–34.0)
MCHC: 32.9 g/dL (ref 30.0–36.0)
MCV: 92.2 fL (ref 80.0–100.0)
Monocytes Absolute: 0.7 10*3/uL (ref 0.1–1.0)
Monocytes Relative: 13 %
Neutro Abs: 2.5 10*3/uL (ref 1.7–7.7)
Neutrophils Relative %: 47 %
Platelets: 260 10*3/uL (ref 150–400)
RBC: 4.12 MIL/uL (ref 3.87–5.11)
RDW: 13.2 % (ref 11.5–15.5)
WBC: 5.4 10*3/uL (ref 4.0–10.5)
nRBC: 0 % (ref 0.0–0.2)

## 2021-05-31 LAB — COMPREHENSIVE METABOLIC PANEL
ALT: 20 U/L (ref 0–44)
AST: 22 U/L (ref 15–41)
Albumin: 4.3 g/dL (ref 3.5–5.0)
Alkaline Phosphatase: 71 U/L (ref 38–126)
Anion gap: 6 (ref 5–15)
BUN: 13 mg/dL (ref 8–23)
CO2: 28 mmol/L (ref 22–32)
Calcium: 9.2 mg/dL (ref 8.9–10.3)
Chloride: 103 mmol/L (ref 98–111)
Creatinine, Ser: 0.56 mg/dL (ref 0.44–1.00)
GFR, Estimated: >60 mL/min (ref >60)
Glucose, Bld: 96 mg/dL (ref 70–99)
Potassium: 3.8 mmol/L (ref 3.5–5.1)
Sodium: 137 mmol/L (ref 135–145)
Total Bilirubin: 0.5 mg/dL (ref 0.3–1.2)
Total Protein: 8 g/dL (ref 6.5–8.1)

## 2021-05-31 MED ORDER — HEPARIN SOD (PORK) LOCK FLUSH 100 UNIT/ML IV SOLN
500.0000 [IU] | Freq: Once | INTRAVENOUS | Status: AC
  Administered 2021-05-31: 500 [IU] via INTRAVENOUS
  Filled 2021-05-31: qty 5

## 2021-05-31 MED ORDER — SODIUM CHLORIDE 0.9% FLUSH
10.0000 mL | INTRAVENOUS | Status: DC | PRN
  Administered 2021-05-31: 10 mL via INTRAVENOUS
  Filled 2021-05-31: qty 10

## 2021-05-31 MED ORDER — LIDOCAINE-PRILOCAINE 2.5-2.5 % EX CREA: 1.0000 "application " | TOPICAL_CREAM | CUTANEOUS | 4 refills | Status: AC | PRN

## 2021-05-31 NOTE — Progress Notes (Signed)
[facility] ?OFFICE PROGRESS NOTE ? ?Patient Care Team: ?Albina Billet, MD as PCP - General (Internal Medicine) ?[name], MD as Consulting Physician (General Surgery) ?Albina Billet, MD (Internal Medicine) ?Cammie Sickle, MD as Consulting Physician (Hematology and Oncology) ? ? ?SUMMARY OF ONCOLOGIC HISTORY: ? ?Oncology History Overview Note  ?# AUG 2015- LEFT BREAST  STAGE IIB [pT2pN1a]ER-NEG; PR- 1-10%; Her 2 NEU POS; Started neo-adj chemo- AC x4 [lung-MALT lymphoma]; Min response Breast tumor- Lumpec & ALND- T= 3.2cm; N= 1/11LN. S/p Taxol-Herceptin [finished May 2016]; on Herceptin [finished February 2017]; MARCH 2017- START arimidex ? ?# MALT LYMPHOMA STAGE I E [s/p ACx 4]; CT AUG 2016- ? STABLE band like opacity in RUL/RML MAY 2017 CT-NED ? ?# [id number] 2016- MUGA 61 %; DEC 22nd 2016- 65% ? ?# LEFT UE LYMPHEDEMA s/p PT ?----------------------------------------------------   ? ?DIAGNOSIS: LEFT BREAST CA ? ?STAGE:  II ;GOALS: curative ? ?CURRENT/MOST RECENT THERAPY: arimidex ? ?  ?Mucosa-associated lymphoid tissue (MALT) lymphoma ([place])  ?04/21/2015 Initial Diagnosis  ? Mucosa-associated lymphoid tissue (MALT) lymphoma ([place]) ? ?  ?Carcinoma of overlapping sites of left breast in female, estrogen receptor negative ([place])  ?09/23/2015 Initial Diagnosis  ? Cancer of overlapping sites of left female breast [facility]) ? ?  ? ? ? ?INTERVAL HISTORY: ? ?A pleasant 81 year-old female patient with above history of stage IIB breast cancer HER-2/neu positive - Currently on anastrozole is here for follow-up/review results of the CT scan. ? ?Patient is quite anxious about today's visit given last PET scan results. Has seen Dr. Vella Kohler in pulmonology in the meantime. No note to review but states that he stated that her changes appeared "stable" and that "no follow up" was recommended. States that BP at home is normal but that she has severe white coat hypertension in our office. Asymptomatic without  headache, chest pain, SOB, visual changes. No fevers or chills.  No new lumps or bumps. No hemoptysis. No bone pain or new joint pain. Tolerating her medications well- mild hot flashes but these are tolerable per patient. Down two pounds which she reports is from diet adjustment.  ? ?Review of Systems  ?Constitutional:  Negative for chills, diaphoresis, fever and malaise/fatigue.  ?HENT:  Negative for nosebleeds and sore throat.   ?Eyes:  Negative for double vision.  ?Respiratory:  Negative for cough, hemoptysis, sputum production, shortness of breath and wheezing.   ?Cardiovascular:  Negative for chest pain, palpitations, orthopnea and leg swelling.  ?Gastrointestinal:  Negative for abdominal pain, blood in stool, constipation, diarrhea, heartburn, melena, nausea and vomiting.  ?Genitourinary:  Negative for dysuria, frequency and urgency.  ?Musculoskeletal:  Positive for back pain and joint pain.  ?Skin: Negative.  Negative for itching and rash.  ?Neurological:  Negative for dizziness, tingling, focal weakness, weakness and headaches.  ?Endo/Heme/Allergies:  Does not bruise/bleed easily.  ?Psychiatric/Behavioral:  Negative for depression. The patient is not nervous/anxious and does not have insomnia.   ? ? ?PAST MEDICAL HISTORY :  ?Past Medical History:  ?Diagnosis Date  ? Breast cancer ([place]) 2015  ? left breast cancer  ? Breast cancer metastasized to axillary lymph node [facility]) August 2015  ? T2, N1, ER negative, PR negative, HER-2 amplified. 2 cm axillary node. One of 11 nodes positive on post-adjuvant chemotherapy axillary dissection. 3+ centimeter tumor.  ? Hyperlipemia   ? Hyperlipidemia   ? Hypertension   ? Lymphedema of upper extremity following lymphadenectomy August 2015  ? Left upper extremity.  ?  MALT lymphoma ([place])   ? Murmur   ? Osteoporosis   ? Personal history of chemotherapy   ? ? ?PAST SURGICAL HISTORY :   ?Past Surgical History:  ?Procedure Laterality Date  ? ABDOMINAL HYSTERECTOMY    ? APPENDECTOMY     ? BREAST SURGERY Left 02/25/14  ? Modified radical mastectomy, T2 N1. Grade 3, ER, PR negative, HER-2/neu 3+.  ? COLONOSCOPY WITH PROPOFOL N/A 01/26/2016  ? Procedure: COLONOSCOPY WITH PROPOFOL;  Surgeon: [name], MD;  Location: [facility] ENDOSCOPY;  Service: Endoscopy;  Laterality: N/A;  ? MASTECTOMY Left 02-25-14  ? Dr Bary Castilla  ? ? ?FAMILY HISTORY :   ?Family History  ?Problem Relation Age of Onset  ? Hypertension Mother   ? Hypertension Maternal Aunt   ? Hypertension Maternal Uncle   ? Hypertension Maternal Aunt   ? Hypertension Maternal Aunt   ? Breast cancer Neg Hx   ? ? ?SOCIAL HISTORY:   ?Social History  ? ?Tobacco Use  ? Smoking status: Never  ? Smokeless tobacco: Never  ?Vaping Use  ? Vaping Use: Never used  ?Substance Use Topics  ? Alcohol use: No  ? Drug use: No  ? ? ?ALLERGIES:  has No Known Allergies. ? ?MEDICATIONS:  ?Current Outpatient Medications  ?Medication Sig Dispense Refill  ? alendronate (FOSAMAX) 70 MG tablet Take 70 mg by mouth once a week. Take with a full glass of water on an empty stomach.    ? amLODipine (NORVASC) 2.5 MG tablet Take 2.5 mg by mouth daily.    ? anastrozole (ARIMIDEX) 1 MG tablet TAKE ONE TABLET BY MOUTH ONCE DAILY 90 tablet 3  ? aspirin 81 MG tablet Take 81 mg by mouth daily.    ? cholecalciferol (VITAMIN D) 1000 UNITS tablet Take 1,000 Units by mouth daily.    ? losartan-hydrochlorothiazide (HYZAAR) 100-25 MG per tablet Take 1 tablet by mouth daily.    ? Multiple Vitamins-Minerals (MULTIVITAMIN WITH MINERALS) tablet Take 1 tablet by mouth daily.    ? potassium chloride SA (KLOR-CON M) 20 MEQ tablet Take 20 mEq by mouth daily.    ? simvastatin (ZOCOR) 40 MG tablet Take 40 mg by mouth at bedtime.    ? ?No current facility-administered medications for this visit.  ? ?Facility-Administered Medications Ordered in Other Visits  ?Medication Dose Route Frequency Provider Last Rate Last Admin  ? sodium chloride flush (NS) 0.9 % injection 10 mL  10 mL Intravenous PRN  Cammie Sickle, MD   10 mL at 10/19/16 1054  ? sodium chloride flush (NS) 0.9 % injection 10 mL  10 mL Intravenous PRN Cammie Sickle, MD   10 mL at 12/13/17 1050  ? ? ?PHYSICAL EXAMINATION: ?ECOG PERFORMANCE STATUS: 0 - Asymptomatic ? ?BP (!) 201/72   Pulse 89   Temp 98.7 ?F (37.1 ?C)   Resp 19   Wt 130 lb (59 kg)   SpO2 96%   BMI 25.39 kg/m?  ? Danley Danker Weights  ? 05/31/21 1315  ?Weight: 130 lb (59 kg)  ? ? ?Physical Exam ?Constitutional:   ?   Comments: Frail-appearing female patient.  She is walking herself.  Alone.  ?HENT:  ?   Head: Normocephalic and atraumatic.  ?   Mouth/Throat:  ?   Pharynx: No oropharyngeal exudate.  ?Eyes:  ?   Pupils: Pupils are equal, round, and reactive to light.  ?Cardiovascular:  ?   Rate and Rhythm: Normal rate and regular rhythm.  ?Pulmonary:  ?  Effort: No respiratory distress.  ?   Breath sounds: No wheezing.  ?Chest:  ?   Comments: Defers exam today ?Abdominal:  ?   General: Bowel sounds are normal. There is no distension.  ?   Palpations: Abdomen is soft. There is no mass.  ?   Tenderness: There is no abdominal tenderness. There is no guarding or rebound.  ?Musculoskeletal:     ?   General: No tenderness. Normal range of motion.  ?   Cervical back: Normal range of motion and neck supple.  ?Skin: ?   General: Skin is warm.  ?Neurological:  ?   Mental Status: She is alert and oriented to person, place, and time.  ?Psychiatric:     ?   Mood and Affect: Affect normal.  ? ? ? ?LABORATORY DATA:  ?I have reviewed the data as listed ?   ?Component Value Date/Time  ? NA 137 05/31/2021 1249  ? NA 136 05/27/2014 0942  ? K 3.8 05/31/2021 1249  ? K 3.4 (L) 05/27/2014 0942  ? CL 103 05/31/2021 1249  ? CL 102 05/27/2014 0942  ? CO2 28 05/31/2021 1249  ? CO2 29 05/27/2014 0942  ? GLUCOSE 96 05/31/2021 1249  ? GLUCOSE 106 (H) 05/27/2014 0942  ? BUN 13 05/31/2021 1249  ? BUN 13 05/27/2014 0942  ? CREATININE 0.56 05/31/2021 1249  ? CREATININE 0.58 05/27/2014 0942  ? CALCIUM 9.2  05/31/2021 1249  ? CALCIUM 8.6 (L) 05/27/2014 0942  ? PROT 8.0 05/31/2021 1249  ? PROT 6.8 05/27/2014 0942  ? ALBUMIN 4.3 05/31/2021 1249  ? ALBUMIN 3.8 05/27/2014 0942  ? AST 22 05/31/2021 1249  ? AST 18 05/27/2014 09

## 2021-05-31 NOTE — Addendum Note (Signed)
Addended by: Drue Dun on: 05/31/2021 02:05 PM ? ? Modules accepted: Orders ? ?

## 2021-06-01 LAB — CANCER ANTIGEN 27.29: CA 27.29: 44.5 U/mL — ABNORMAL HIGH (ref 0.0–38.6)

## 2021-08-01 ENCOUNTER — Other Ambulatory Visit: Payer: Self-pay | Admitting: Internal Medicine

## 2021-08-01 DIAGNOSIS — C50912 Malignant neoplasm of unspecified site of left female breast: Secondary | ICD-10-CM

## 2021-08-30 ENCOUNTER — Inpatient Hospital Stay (HOSPITAL_BASED_OUTPATIENT_CLINIC_OR_DEPARTMENT_OTHER): Payer: Medicare HMO | Admitting: Internal Medicine

## 2021-08-30 ENCOUNTER — Inpatient Hospital Stay: Payer: Medicare HMO | Attending: Internal Medicine

## 2021-08-30 ENCOUNTER — Encounter: Payer: Self-pay | Admitting: Internal Medicine

## 2021-08-30 DIAGNOSIS — C884 Extranodal marginal zone B-cell lymphoma of mucosa-associated lymphoid tissue [MALT-lymphoma]: Secondary | ICD-10-CM | POA: Diagnosis present

## 2021-08-30 DIAGNOSIS — I10 Essential (primary) hypertension: Secondary | ICD-10-CM | POA: Diagnosis not present

## 2021-08-30 DIAGNOSIS — Z5181 Encounter for therapeutic drug level monitoring: Secondary | ICD-10-CM

## 2021-08-30 DIAGNOSIS — C50812 Malignant neoplasm of overlapping sites of left female breast: Secondary | ICD-10-CM | POA: Insufficient documentation

## 2021-08-30 DIAGNOSIS — Z171 Estrogen receptor negative status [ER-]: Secondary | ICD-10-CM

## 2021-08-30 DIAGNOSIS — Z95828 Presence of other vascular implants and grafts: Secondary | ICD-10-CM

## 2021-08-30 DIAGNOSIS — R918 Other nonspecific abnormal finding of lung field: Secondary | ICD-10-CM | POA: Insufficient documentation

## 2021-08-30 DIAGNOSIS — Z79899 Other long term (current) drug therapy: Secondary | ICD-10-CM | POA: Diagnosis not present

## 2021-08-30 DIAGNOSIS — Z79811 Long term (current) use of aromatase inhibitors: Secondary | ICD-10-CM | POA: Diagnosis not present

## 2021-08-30 LAB — COMPREHENSIVE METABOLIC PANEL
ALT: 12 U/L (ref 0–44)
AST: 18 U/L (ref 15–41)
Albumin: 3.8 g/dL (ref 3.5–5.0)
Alkaline Phosphatase: 74 U/L (ref 38–126)
Anion gap: 6 (ref 5–15)
BUN: 14 mg/dL (ref 8–23)
CO2: 29 mmol/L (ref 22–32)
Calcium: 8.8 mg/dL — ABNORMAL LOW (ref 8.9–10.3)
Chloride: 104 mmol/L (ref 98–111)
Creatinine, Ser: 0.73 mg/dL (ref 0.44–1.00)
GFR, Estimated: >60 mL/min (ref >60)
Glucose, Bld: 103 mg/dL — ABNORMAL HIGH (ref 70–99)
Potassium: 3.3 mmol/L — ABNORMAL LOW (ref 3.5–5.1)
Sodium: 139 mmol/L (ref 135–145)
Total Bilirubin: 0.5 mg/dL (ref 0.3–1.2)
Total Protein: 7.3 g/dL (ref 6.5–8.1)

## 2021-08-30 LAB — CBC WITH DIFFERENTIAL/PLATELET
Abs Immature Granulocytes: 0.01 10*3/uL (ref 0.00–0.07)
Basophils Absolute: 0 10*3/uL (ref 0.0–0.1)
Basophils Relative: 0 %
Eosinophils Absolute: 0 10*3/uL (ref 0.0–0.5)
Eosinophils Relative: 1 %
HCT: 35.8 % — ABNORMAL LOW (ref 36.0–46.0)
Hemoglobin: 11.8 g/dL — ABNORMAL LOW (ref 12.0–15.0)
Immature Granulocytes: 0 %
Lymphocytes Relative: 33 %
Lymphs Abs: 1.8 10*3/uL (ref 0.7–4.0)
MCH: 30.8 pg (ref 26.0–34.0)
MCHC: 33 g/dL (ref 30.0–36.0)
MCV: 93.5 fL (ref 80.0–100.0)
Monocytes Absolute: 1 10*3/uL (ref 0.1–1.0)
Monocytes Relative: 18 %
Neutro Abs: 2.5 10*3/uL (ref 1.7–7.7)
Neutrophils Relative %: 48 %
Platelets: 240 10*3/uL (ref 150–400)
RBC: 3.83 MIL/uL — ABNORMAL LOW (ref 3.87–5.11)
RDW: 12.9 % (ref 11.5–15.5)
WBC: 5.3 10*3/uL (ref 4.0–10.5)
nRBC: 0 % (ref 0.0–0.2)

## 2021-08-30 MED ORDER — HEPARIN SOD (PORK) LOCK FLUSH 100 UNIT/ML IV SOLN
500.0000 [IU] | Freq: Once | INTRAVENOUS | Status: AC
  Administered 2021-08-30: 500 [IU] via INTRAVENOUS
  Filled 2021-08-30: qty 5

## 2021-08-30 MED ORDER — SODIUM CHLORIDE 0.9% FLUSH
10.0000 mL | INTRAVENOUS | Status: DC | PRN
  Administered 2021-08-30: 10 mL via INTRAVENOUS
  Filled 2021-08-30: qty 10

## 2021-08-30 NOTE — Progress Notes (Unsigned)
[place] OFFICE PROGRESS NOTE  Patient Care Team: Albina Billet, MD as PCP - General (Internal Medicine) Bary Castilla, Forest Gleason, MD as Consulting Physician (General Surgery) Albina Billet, MD (Internal Medicine) Cammie Sickle, MD as Consulting Physician (Hematology and Oncology)   SUMMARY OF ONCOLOGIC HISTORY:  Oncology History Overview Note  # AUG 2015- LEFT BREAST  STAGE IIB [pT2pN1a]ER-NEG; PR- 1-10%; Her 2 NEU POS; Started neo-adj chemo- AC x4 [lung-MALT lymphoma]; Min response Breast tumor- Lumpec & ALND- T= 3.2cm; N= 1/11LN. 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 STABLE band like opacity in RUL/RML MAY 2017 CT-NED  # SEP21st 2016- MUGA 61 %; DEC 22nd 2016- 65%  # LEFT UE LYMPHEDEMA s/p PT ----------------------------------------------------    DIAGNOSIS: LEFT BREAST CA  STAGE:  II ;GOALS: curative  CURRENT/MOST RECENT THERAPY: arimidex    Mucosa-associated lymphoid tissue (MALT) lymphoma ([place])  04/21/2015 Initial Diagnosis   Mucosa-associated lymphoid tissue (MALT) lymphoma ([facility])   Carcinoma of overlapping sites of left breast in female, estrogen receptor negative ([place])  09/23/2015 Initial Diagnosis   Cancer of overlapping sites of left female breast ([place])      INTERVAL HISTORY: Ambulating independently.  Accompanied by family/husband.  A pleasant 59 year-old female patient with above history of stage IIB breast cancer HER-2/neu positive; and pulmonary MALT lymphoma[ on surveillance]- Currently on anastrozole is here for follow-up.  Last visit January 2023-PET scan chronic fibrotic changes in the right upper lung slightly progressive.  Question progressive bronchiectasis versus lymphoma.  S/p evaluation with Dr.Aleskerov-declined bronchoscopy.   Continues to be asymptomatic.  No worsening cough or shortness of breath.  Denies  any nausea vomiting headache.  No fevers or chills.  No new lumps or bumps.  Review of Systems  Constitutional:  Positive for malaise/fatigue. Negative for chills, diaphoresis and fever.  HENT:  Negative for nosebleeds and sore throat.   Eyes:  Negative for double vision.  Respiratory:  Negative for cough, hemoptysis, sputum production, shortness of breath and wheezing.   Cardiovascular:  Negative for chest pain, palpitations, orthopnea and leg swelling.  Gastrointestinal:  Negative for abdominal pain, blood in stool, constipation, diarrhea, heartburn, melena, nausea and vomiting.  Genitourinary:  Negative for dysuria, frequency and urgency.  Musculoskeletal:  Positive for back pain and joint pain.  Skin: Negative.  Negative for itching and rash.  Neurological:  Negative for dizziness, tingling, focal weakness, weakness and headaches.  Endo/Heme/Allergies:  Does not bruise/bleed easily.  Psychiatric/Behavioral:  Negative for depression. The patient is not nervous/anxious and does not have insomnia.      PAST MEDICAL HISTORY :  Past Medical History:  Diagnosis Date  . Breast cancer ([place]) 2015   left breast cancer  . Breast cancer metastasized to axillary lymph node ([place]) August 2015   T2, N1, ER negative, PR negative, HER-2 amplified. 2 cm axillary node. One of 11 nodes positive on post-adjuvant chemotherapy axillary dissection. 3+ centimeter tumor.  . Hyperlipemia   . Hyperlipidemia   . Hypertension   . 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  Marland Kitchen MALT lymphoma ([place])   . Murmur   . Osteoporosis   . Personal history of chemotherapy     PAST SURGICAL HISTORY :   Past Surgical History:  Procedure Laterality Date  . ABDOMINAL HYSTERECTOMY    . APPENDECTOMY    .  BREAST SURGERY Left 02/25/14   Modified radical mastectomy, T2 N1. Grade 3, ER, PR negative, HER-2/neu 3+.  . COLONOSCOPY WITH PROPOFOL N/A 01/26/2016   Procedure:  COLONOSCOPY WITH PROPOFOL;  Surgeon: [name], MD;  Location: [facility] ENDOSCOPY;  Service: Endoscopy;  Laterality: N/A;  . MASTECTOMY Left 02-25-14   Dr Bary Castilla    FAMILY HISTORY :   Family History  Problem Relation Age of Onset  . Hypertension Mother   . Hypertension Maternal Aunt   . Hypertension Maternal Uncle   . Hypertension Maternal Aunt   . Hypertension Maternal Aunt   . Breast cancer Neg Hx     SOCIAL HISTORY:   Social History   Tobacco Use  . Smoking status: Never  . Smokeless tobacco: Never  Vaping Use  . Vaping Use: Never used  Substance Use Topics  . Alcohol use: No  . Drug use: No    ALLERGIES:  has No Known Allergies.  MEDICATIONS:  Current Outpatient Medications  Medication Sig Dispense Refill  . alendronate (FOSAMAX) 70 MG tablet Take 70 mg by mouth once a week. Take with a full glass of water on an empty stomach.    Marland Kitchen amLODipine (NORVASC) 2.5 MG tablet Take 2.5 mg by mouth daily.    Marland Kitchen anastrozole (ARIMIDEX) 1 MG tablet TAKE 1 TABLET BY MOUTH ONCE DAILY 90 tablet 3  . aspirin 81 MG tablet Take 81 mg by mouth daily.    . cholecalciferol (VITAMIN D) 1000 UNITS tablet Take 1,000 Units by mouth daily.    Marland Kitchen lidocaine-prilocaine (EMLA) cream Apply 1 application. topically as needed. Apply to port and cover with saran wrap 1-2 hours prior to port access 30 g 4  . losartan-hydrochlorothiazide (HYZAAR) 100-25 MG per tablet Take 1 tablet by mouth daily.    . Multiple Vitamins-Minerals (MULTIVITAMIN WITH MINERALS) tablet Take 1 tablet by mouth daily.    . potassium chloride SA (KLOR-CON M) 20 MEQ tablet Take 20 mEq by mouth daily.    . simvastatin (ZOCOR) 40 MG tablet Take 40 mg by mouth at bedtime.     No current facility-administered medications for this visit.   Facility-Administered Medications Ordered in Other Visits  Medication Dose Route Frequency Provider Last Rate Last Admin  . sodium chloride flush (NS) 0.9 % injection 10 mL  10 mL Intravenous PRN  Cammie Sickle, MD   10 mL at 10/19/16 1054  . sodium chloride flush (NS) 0.9 % injection 10 mL  10 mL Intravenous PRN Cammie Sickle, MD   10 mL at 12/13/17 1050  . sodium chloride flush (NS) 0.9 % injection 10 mL  10 mL Intravenous PRN Cammie Sickle, MD   10 mL at 08/30/21 1408    PHYSICAL EXAMINATION: ECOG PERFORMANCE STATUS: 0 - Asymptomatic  BP (!) 151/83 (BP Location: Right Arm, Patient Position: Sitting)   Pulse 79   Temp 99.2 F (37.3 C) (Tympanic)   Resp 16   Wt 131 lb 6.4 oz (59.6 kg)   BMI 25.66 kg/m   Filed Weights   08/30/21 1400  Weight: 131 lb 6.4 oz (59.6 kg)    Physical Exam Constitutional:      Comments: Frail-appearing female patient.  She is walking herself.  Alone.  HENT:     Head: Normocephalic and atraumatic.     Mouth/Throat:     Pharynx: No oropharyngeal exudate.  Eyes:     Pupils: Pupils are equal, round, and reactive to light.  Cardiovascular:  Rate and Rhythm: Normal rate and regular rhythm.  Pulmonary:     Effort: No respiratory distress.     Breath sounds: No wheezing.  Abdominal:     General: Bowel sounds are normal. There is no distension.     Palpations: Abdomen is soft. There is no mass.     Tenderness: There is no abdominal tenderness. There is no guarding or rebound.  Musculoskeletal:        General: No tenderness. Normal range of motion.     Cervical back: Normal range of motion and neck supple.  Skin:    General: Skin is warm.  Neurological:     Mental Status: She is alert and oriented to person, place, and time.  Psychiatric:        Mood and Affect: Affect normal.     LABORATORY DATA:  I have reviewed the data as listed    Component Value Date/Time   NA 139 08/30/2021 1406   NA 136 05/27/2014 0942   K 3.3 (L) 08/30/2021 1406   K 3.4 (L) 05/27/2014 0942   CL 104 08/30/2021 1406   CL 102 05/27/2014 0942   CO2 29 08/30/2021 1406   CO2 29 05/27/2014 0942   GLUCOSE 103 (H) 08/30/2021 1406    GLUCOSE 106 (H) 05/27/2014 0942   BUN 14 08/30/2021 1406   BUN 13 05/27/2014 0942   CREATININE 0.73 08/30/2021 1406   CREATININE 0.58 05/27/2014 0942   CALCIUM 8.8 (L) 08/30/2021 1406   CALCIUM 8.6 (L) 05/27/2014 0942   PROT 7.3 08/30/2021 1406   PROT 6.8 05/27/2014 0942   ALBUMIN 3.8 08/30/2021 1406   ALBUMIN 3.8 05/27/2014 0942   AST 18 08/30/2021 1406   AST 18 05/27/2014 0942   ALT 12 08/30/2021 1406   ALT 14 05/27/2014 0942   ALKPHOS 74 08/30/2021 1406   ALKPHOS 69 05/27/2014 0942   BILITOT 0.5 08/30/2021 1406   BILITOT 0.6 05/27/2014 0942   GFRNONAA >60 08/30/2021 1406   GFRNONAA >60 05/27/2014 0942   GFRAA >60 10/22/2019 1457   GFRAA >60 05/27/2014 0942    No results found for: "SPEP", "UPEP"  Lab Results  Component Value Date   WBC 5.3 08/30/2021   NEUTROABS 2.5 08/30/2021   HGB 11.8 (L) 08/30/2021   HCT 35.8 (L) 08/30/2021   MCV 93.5 08/30/2021   PLT 240 08/30/2021      Chemistry      Component Value Date/Time   NA 139 08/30/2021 1406   NA 136 05/27/2014 0942   K 3.3 (L) 08/30/2021 1406   K 3.4 (L) 05/27/2014 0942   CL 104 08/30/2021 1406   CL 102 05/27/2014 0942   CO2 29 08/30/2021 1406   CO2 29 05/27/2014 0942   BUN 14 08/30/2021 1406   BUN 13 05/27/2014 0942   CREATININE 0.73 08/30/2021 1406   CREATININE 0.58 05/27/2014 0942      Component Value Date/Time   CALCIUM 8.8 (L) 08/30/2021 1406   CALCIUM 8.6 (L) 05/27/2014 0942   ALKPHOS 74 08/30/2021 1406   ALKPHOS 69 05/27/2014 0942   AST 18 08/30/2021 1406   AST 18 05/27/2014 0942   ALT 12 08/30/2021 1406   ALT 14 05/27/2014 0942   BILITOT 0.5 08/30/2021 1406   BILITOT 0.6 05/27/2014 0942       ASSESSMENT & PLAN:   Carcinoma of overlapping sites of left breast in female, estrogen receptor negative ([place]) # 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 On anastrazole. CT scan- JAN 2023-stable/0.7 cm subpectoral lymph  node.  Continue anastrozole for now. 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 Favor chronic atypical infection. Chronic, indolent lymphoma felt less likely. If this is a clinical concern, bronchoscopy could be performed. S/p evaluation with Dr.A- declined bronch. As asymtoptmactic    # HTN- poorly controlled in clinic- systolic [id number]; at home 120s.   STABLE.    # Intermittently Elevated tumor marker CA 27-29-SEP 44; await repeat tumor marker testing.   # OSTEOPENIA- JULy 2022-T-score of -1.5; continue Fosamax with vitamin D.'  Stable.  # port flush every 3 months; keep for now.   # DISPOSITION:  # follow up in 3 months-MD/ labs-cbc/cmp/ ca 27-29/port flush-Dr.B.   Cc;Dr.Tate.      Cammie Sickle, MD 08/30/2021 3:09 PM

## 2021-08-30 NOTE — Assessment & Plan Note (Addendum)
#  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 On anastrazole. CT scan- JAN 2023-stable/0.7 cm subpectoral lymph node.  Continue anastrozole for now. 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 Favor chronic atypical infection. Chronic, indolent lymphoma felt less likely. If this is a clinical concern, bronchoscopy could be performed. S/p evaluation with Dr.A- declined bronch.  Continue surveillance as patient is asymptomatic.  Monitor for now.  # HTN- poorly controlled in clinic- systolic [id number]; at home 120s.   STABLE.    # Intermittently Elevated tumor marker CA 27-29-SEP 44; await repeat tumor marker testing.   # OSTEOPENIA- JULy 2022-T-score of -1.5; continue Fosamax with vitamin D.'  Stable.  # port flush every 3 months; keep for now.   # DISPOSITION:  # follow up in 3 months-MD/ labs-cbc/cmp/ ca 27-29/port flush-Dr.B.   Cc;Dr.Tate.

## 2021-08-30 NOTE — Progress Notes (Unsigned)
Patient denies new problems/concerns today.   °

## 2021-08-31 LAB — CANCER ANTIGEN 27.29: CA 27.29: 44.2 U/mL — ABNORMAL HIGH (ref 0.0–38.6)

## 2021-10-12 ENCOUNTER — Emergency Department
Admission: EM | Admit: 2021-10-12 | Discharge: 2021-10-12 | Disposition: A | Discharge status: Home / Self Care | Payer: Medicare HMO | Attending: Emergency Medicine | Admitting: Emergency Medicine

## 2021-10-12 ENCOUNTER — Other Ambulatory Visit: Payer: Self-pay

## 2021-10-12 DIAGNOSIS — I10 Essential (primary) hypertension: Secondary | ICD-10-CM | POA: Diagnosis not present

## 2021-10-12 DIAGNOSIS — Y92410 Unspecified street and highway as the place of occurrence of the external cause: Secondary | ICD-10-CM | POA: Insufficient documentation

## 2021-10-12 DIAGNOSIS — Z041 Encounter for examination and observation following transport accident: Secondary | ICD-10-CM | POA: Insufficient documentation

## 2021-10-12 NOTE — ED Notes (Signed)
First Nurse Note: Pt to ED via [facility] from [place]. Pt having pain in her arm. Pt is in NAD.

## 2021-10-12 NOTE — ED Notes (Signed)
Dc instructions reviewed with pt no questions or concerns at this time will follow up if needed.
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
## 2021-10-12 NOTE — Discharge Instructions (Addendum)
-  Take acetaminophen (Tylenol) as needed for pain.  You will likely be sore for about 3 to 5 days.  Avoid any excessive exertional activities while you recover.  -Follow-up with your primary care provider as needed.  -Return to the emergency department anytime if you begin to experience any new or worsening symptoms.
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
## 2021-11-30 ENCOUNTER — Encounter: Payer: Self-pay | Admitting: Internal Medicine

## 2021-11-30 ENCOUNTER — Inpatient Hospital Stay (HOSPITAL_BASED_OUTPATIENT_CLINIC_OR_DEPARTMENT_OTHER): Payer: Medicare HMO | Admitting: Internal Medicine

## 2021-11-30 ENCOUNTER — Inpatient Hospital Stay: Payer: Medicare HMO | Attending: Internal Medicine

## 2021-11-30 DIAGNOSIS — C50812 Malignant neoplasm of overlapping sites of left female breast: Secondary | ICD-10-CM | POA: Insufficient documentation

## 2021-11-30 DIAGNOSIS — C884 Extranodal marginal zone B-cell lymphoma of mucosa-associated lymphoid tissue [MALT-lymphoma]: Secondary | ICD-10-CM | POA: Diagnosis not present

## 2021-11-30 DIAGNOSIS — Z79811 Long term (current) use of aromatase inhibitors: Secondary | ICD-10-CM | POA: Insufficient documentation

## 2021-11-30 DIAGNOSIS — I10 Essential (primary) hypertension: Secondary | ICD-10-CM | POA: Diagnosis not present

## 2021-11-30 DIAGNOSIS — Z171 Estrogen receptor negative status [ER-]: Secondary | ICD-10-CM | POA: Diagnosis not present

## 2021-11-30 DIAGNOSIS — Z79899 Other long term (current) drug therapy: Secondary | ICD-10-CM | POA: Diagnosis not present

## 2021-11-30 DIAGNOSIS — Z95828 Presence of other vascular implants and grafts: Secondary | ICD-10-CM

## 2021-11-30 LAB — CBC WITH DIFFERENTIAL/PLATELET
Abs Immature Granulocytes: 0.01 10*3/uL (ref 0.00–0.07)
Basophils Absolute: 0 10*3/uL (ref 0.0–0.1)
Basophils Relative: 1 %
Eosinophils Absolute: 0 10*3/uL (ref 0.0–0.5)
Eosinophils Relative: 1 %
HCT: 36.3 % (ref 36.0–46.0)
Hemoglobin: 12 g/dL (ref 12.0–15.0)
Immature Granulocytes: 0 %
Lymphocytes Relative: 40 %
Lymphs Abs: 2.3 10*3/uL (ref 0.7–4.0)
MCH: 30.5 pg (ref 26.0–34.0)
MCHC: 33.1 g/dL (ref 30.0–36.0)
MCV: 92.4 fL (ref 80.0–100.0)
Monocytes Absolute: 1 10*3/uL (ref 0.1–1.0)
Monocytes Relative: 18 %
Neutro Abs: 2.3 10*3/uL (ref 1.7–7.7)
Neutrophils Relative %: 40 %
Platelets: 253 10*3/uL (ref 150–400)
RBC: 3.93 MIL/uL (ref 3.87–5.11)
RDW: 13.1 % (ref 11.5–15.5)
WBC: 5.7 10*3/uL (ref 4.0–10.5)
nRBC: 0 % (ref 0.0–0.2)

## 2021-11-30 LAB — COMPREHENSIVE METABOLIC PANEL
ALT: 16 U/L (ref 0–44)
AST: 22 U/L (ref 15–41)
Albumin: 3.9 g/dL (ref 3.5–5.0)
Alkaline Phosphatase: 65 U/L (ref 38–126)
Anion gap: 7 (ref 5–15)
BUN: 17 mg/dL (ref 8–23)
CO2: 28 mmol/L (ref 22–32)
Calcium: 9.1 mg/dL (ref 8.9–10.3)
Chloride: 103 mmol/L (ref 98–111)
Creatinine, Ser: 0.61 mg/dL (ref 0.44–1.00)
GFR, Estimated: >60 mL/min (ref >60)
Glucose, Bld: 104 mg/dL — ABNORMAL HIGH (ref 70–99)
Potassium: 3.5 mmol/L (ref 3.5–5.1)
Sodium: 138 mmol/L (ref 135–145)
Total Bilirubin: 0.5 mg/dL (ref 0.3–1.2)
Total Protein: 7.9 g/dL (ref 6.5–8.1)

## 2021-11-30 MED ORDER — HEPARIN SOD (PORK) LOCK FLUSH 100 UNIT/ML IV SOLN
500.0000 [IU] | Freq: Once | INTRAVENOUS | Status: AC
  Administered 2021-11-30: 500 [IU] via INTRAVENOUS
  Filled 2021-11-30: qty 5

## 2021-11-30 MED ORDER — SODIUM CHLORIDE 0.9% FLUSH
10.0000 mL | Freq: Once | INTRAVENOUS | Status: AC
  Administered 2021-11-30: 10 mL via INTRAVENOUS
  Filled 2021-11-30: qty 10

## 2021-11-30 NOTE — Progress Notes (Unsigned)
[facility] OFFICE PROGRESS NOTE  Patient Care Team: Albina Billet, MD as PCP - General (Internal Medicine) Bary Castilla, Forest Gleason, MD as Consulting Physician (General Surgery) Albina Billet, MD (Internal Medicine) Cammie Sickle, MD as Consulting Physician (Hematology and Oncology)   SUMMARY OF ONCOLOGIC HISTORY:  Oncology History Overview Note  # AUG 2015- LEFT BREAST  STAGE IIB [pT2pN1a]ER-NEG; PR- 1-10%; Her 2 NEU POS; Started neo-adj chemo- AC x4 [lung-MALT lymphoma]; Min response Breast tumor- Lumpec & ALND- T= 3.2cm; N= 1/11LN. 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  Accompanied by family/husband.  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  Continues to be asymptomatic.  No worsening cough or shortness of breath.  Denies any nausea vomiting headache.  No fevers or chills.  No new lumps or bumps.   Review of Systems  Constitutional:  Positive for malaise/fatigue. Negative for chills, diaphoresis and fever.  HENT:  Negative for  nosebleeds and sore throat.   Eyes:  Negative for double vision.  Respiratory:  Negative for cough, hemoptysis, sputum production, shortness of breath and wheezing.   Cardiovascular:  Negative for chest pain, palpitations, orthopnea and leg swelling.  Gastrointestinal:  Negative for abdominal pain, blood in stool, constipation, diarrhea, heartburn, melena, nausea and vomiting.  Genitourinary:  Negative for dysuria, frequency and urgency.  Musculoskeletal:  Positive for back pain and joint pain.  Skin: Negative.  Negative for itching and rash.  Neurological:  Negative for dizziness, tingling, focal weakness, weakness and headaches.  Endo/Heme/Allergies:  Does not bruise/bleed easily.  Psychiatric/Behavioral:  Negative for depression. The patient is not nervous/anxious and does not have insomnia.      PAST MEDICAL HISTORY :  Past Medical History:  Diagnosis Date  . Breast cancer ([place]) 2015   left breast cancer  . Breast cancer metastasized to axillary lymph node ([place]) August 2015   T2, N1, ER negative, PR negative, HER-2 amplified. 2 cm axillary node. One of 11 nodes positive on post-adjuvant chemotherapy axillary dissection. 3+ centimeter tumor.  . Hyperlipemia   . Hyperlipidemia   . Hypertension   . 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  Marland Kitchen MALT lymphoma ([place])   . Murmur   . Osteoporosis   . Personal history of chemotherapy     PAST SURGICAL HISTORY :   Past Surgical History:  Procedure Laterality Date  . ABDOMINAL HYSTERECTOMY    . APPENDECTOMY    . BREAST SURGERY Left 02/25/14   Modified radical mastectomy, T2 N1. Grade 3, ER, PR negative, HER-2/neu 3+.  . COLONOSCOPY WITH PROPOFOL N/A 01/26/2016  Procedure: COLONOSCOPY WITH PROPOFOL;  Surgeon: [name], MD;  [facility] ENDOSCOPY;  Service: Endoscopy;  Laterality: N/A;  . MASTECTOMY Left 02-25-14   Dr Bary Castilla    FAMILY HISTORY :   Family History  Problem  Relation Age of Onset  . Hypertension Mother   . Hypertension Maternal Aunt   . Hypertension Maternal Uncle   . Hypertension Maternal Aunt   . Hypertension Maternal Aunt   . Breast cancer Neg Hx     SOCIAL HISTORY:   Social History   Tobacco Use  . Smoking status: Never  . Smokeless tobacco: Never  Vaping Use  . Vaping Use: Never used  Substance Use Topics  . Alcohol use: No  . Drug use: No    ALLERGIES:  has No Known Allergies.  MEDICATIONS:  Current Outpatient Medications  Medication Sig Dispense Refill  . alendronate (FOSAMAX) 70 MG tablet Take 70 mg by mouth once a week. Take with a full glass of water on an empty stomach.    Marland Kitchen amLODipine (NORVASC) 2.5 MG tablet Take 2.5 mg by mouth daily.    Marland Kitchen anastrozole (ARIMIDEX) 1 MG tablet TAKE 1 TABLET BY MOUTH ONCE DAILY 90 tablet 3  . aspirin 81 MG tablet Take 81 mg by mouth daily.    . cholecalciferol (VITAMIN D) 1000 UNITS tablet Take 1,000 Units by mouth daily.    Marland Kitchen lidocaine-prilocaine (EMLA) cream Apply 1 application. topically as needed. Apply to port and cover with saran wrap 1-2 hours prior to port access 30 g 4  . losartan-hydrochlorothiazide (HYZAAR) 100-25 MG per tablet Take 1 tablet by mouth daily.    . Multiple Vitamins-Minerals (MULTIVITAMIN WITH MINERALS) tablet Take 1 tablet by mouth daily.    . potassium chloride SA (KLOR-CON M) 20 MEQ tablet Take 20 mEq by mouth daily.    . simvastatin (ZOCOR) 40 MG tablet Take 40 mg by mouth at bedtime.     No current facility-administered medications for this visit.   Facility-Administered Medications Ordered in Other Visits  Medication Dose Route Frequency Provider Last Rate Last Admin  . sodium chloride flush (NS) 0.9 % injection 10 mL  10 mL Intravenous PRN Cammie Sickle, MD   10 mL at 10/19/16 1054  . sodium chloride flush (NS) 0.9 % injection 10 mL  10 mL Intravenous PRN Cammie Sickle, MD   10 mL at 12/13/17 1050    PHYSICAL EXAMINATION: ECOG  PERFORMANCE STATUS: 0 - Asymptomatic  BP (!) 158/72 (BP Location: Right Arm, Patient Position: Sitting)   Pulse 79   Temp (!) 96.1 F (35.6 C) (Tympanic)   Ht 5' (1.524 m)   Wt 130 lb (59 kg)   BMI 25.39 kg/m   Filed Weights   11/30/21 1440  Weight: 130 lb (59 kg)    Physical Exam Constitutional:      Comments: Frail-appearing female patient.  She is walking herself.  Alone.  HENT:     Head: Normocephalic and atraumatic.     Mouth/Throat:     Pharynx: No oropharyngeal exudate.  Eyes:     Pupils: Pupils are equal, round, and reactive to light.  Cardiovascular:     Rate and Rhythm: Normal rate and regular rhythm.  Pulmonary:     Effort: No respiratory distress.     Breath sounds: No wheezing.  Abdominal:     General: Bowel sounds are normal. There is no distension.     Palpations: Abdomen is soft. There is no mass.  Tenderness: There is no abdominal tenderness. There is no guarding or rebound.  Musculoskeletal:        General: No tenderness. Normal range of motion.     Cervical back: Normal range of motion and neck supple.  Skin:    General: Skin is warm.  Neurological:     Mental Status: She is alert and oriented to person, place, and time.  Psychiatric:        Mood and Affect: Affect normal.     LABORATORY DATA:  I have reviewed the data as listed    Component Value Date/Time   NA 138 11/30/2021 1425   NA 136 05/27/2014 0942   K 3.5 11/30/2021 1425   K 3.4 (L) 05/27/2014 0942   CL 103 11/30/2021 1425   CL 102 05/27/2014 0942   CO2 28 11/30/2021 1425   CO2 29 05/27/2014 0942   GLUCOSE 104 (H) 11/30/2021 1425   GLUCOSE 106 (H) 05/27/2014 0942   BUN 17 11/30/2021 1425   BUN 13 05/27/2014 0942   CREATININE 0.61 11/30/2021 1425   CREATININE 0.58 05/27/2014 0942   CALCIUM 9.1 11/30/2021 1425   CALCIUM 8.6 (L) 05/27/2014 0942   PROT 7.9 11/30/2021 1425   PROT 6.8 05/27/2014 0942   ALBUMIN 3.9 11/30/2021 1425   ALBUMIN 3.8 05/27/2014 0942   AST 22  11/30/2021 1425   AST 18 05/27/2014 0942   ALT 16 11/30/2021 1425   ALT 14 05/27/2014 0942   ALKPHOS 65 11/30/2021 1425   ALKPHOS 69 05/27/2014 0942   BILITOT 0.5 11/30/2021 1425   BILITOT 0.6 05/27/2014 0942   GFRNONAA >60 11/30/2021 1425   GFRNONAA >60 05/27/2014 0942   GFRAA >60 10/22/2019 1457   GFRAA >60 05/27/2014 0942    No results found for: "SPEP", "UPEP"  Lab Results  Component Value Date   WBC 5.7 11/30/2021   NEUTROABS 2.3 11/30/2021   HGB 12.0 11/30/2021   HCT 36.3 11/30/2021   MCV 92.4 11/30/2021   PLT 253 11/30/2021      Chemistry      Component Value Date/Time   NA 138 11/30/2021 1425   NA 136 05/27/2014 0942   K 3.5 11/30/2021 1425   K 3.4 (L) 05/27/2014 0942   CL 103 11/30/2021 1425   CL 102 05/27/2014 0942   CO2 28 11/30/2021 1425   CO2 29 05/27/2014 0942   BUN 17 11/30/2021 1425   BUN 13 05/27/2014 0942   CREATININE 0.61 11/30/2021 1425   CREATININE 0.58 05/27/2014 0942      Component Value Date/Time   CALCIUM 9.1 11/30/2021 1425   CALCIUM 8.6 (L) 05/27/2014 0942   ALKPHOS 65 11/30/2021 1425   ALKPHOS 69 05/27/2014 0942   AST 22 11/30/2021 1425   AST 18 05/27/2014 0942   ALT 16 11/30/2021 1425   ALT 14 05/27/2014 0942   BILITOT 0.5 11/30/2021 1425   BILITOT 0.6 05/27/2014 0942       ASSESSMENT & PLAN:   Carcinoma of overlapping sites of left breast in female, estrogen receptor negative ([place]) # 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 On anastrazole. CT scan- JAN 2023-stable/0.7 cm subpectoral lymph node.  Continue anastrozole for now. STABLE. 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 Favor chronic atypical infection. Chronic, indolent lymphoma felt  less likely. If this is a  clinical concern, bronchoscopy could be performed. S/p evaluation with Dr.A- declined bronch.  Continue surveillance as patient is asymptomatic. STABLE.    # HTN- poorly controlled in clinic- systolic [id number]; at home 120s.   STABLE.    # Intermittently Elevated tumor marker CA 27-29-JULy 2023- 44; Again no clear trend noted [facility] 2023- 49]; await repeat tumor marker testing. . Monitor for now.   # OSTEOPENIA- JULy 2022-T-score of -1.5; continue Fosamax with vitamin D.'  Stable.  # port flush every 3 months; keep for now.   # DISPOSITION:  # mammogram RIGHT screening in dec 2023 # follow up in 3 months-MD/ labs-cbc/cmp/ ca 27-29/port flush-Dr.B.   Cc;Dr.Tate.      Cammie Sickle, MD 11/30/2021 3:13 PM

## 2021-11-30 NOTE — Assessment & Plan Note (Addendum)
#  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 On anastrazole. CT scan- JAN 2023-stable/0.7 cm subpectoral lymph node.  Continue anastrozole for now. STABLE. 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 Favor chronic atypical infection. Chronic, indolent lymphoma felt less likely. If this is a clinical concern, bronchoscopy could be performed. S/p evaluation with Dr.A- declined bronch.  Continue surveillance as patient is asymptomatic. STABLE.   # HTN- poorly controlled in clinic- systolic [id number]; at home 120s.   STABLE.    # Intermittently Elevated tumor marker CA 27-29-JULy 2023- 44; Again no clear trend noted [facility] 2023- 49]; await repeat tumor marker testing. . Monitor for now.   # OSTEOPENIA- JULy 2022-T-score of -1.5; continue Fosamax with vitamin D.'  Stable.  # port flush every 3 months; keep for now.   # DISPOSITION:  # mammogram RIGHT screening in dec 2023 # follow up in 3 months-MD/ labs-cbc/cmp/ ca 27-29/port flush-Dr.B.   Cc;Dr.Tate.

## 2021-11-30 NOTE — Progress Notes (Unsigned)
Patient here for follow up. Patient denies any concerns at this time. 

## 2021-12-02 LAB — CANCER ANTIGEN 27.29: CA 27.29: 47.4 U/mL — ABNORMAL HIGH (ref 0.0–38.6)

## 2022-01-25 ENCOUNTER — Inpatient Hospital Stay: Payer: Medicare HMO | Attending: Internal Medicine

## 2022-01-25 DIAGNOSIS — Z170 Estrogen receptor positive status [ER+]: Secondary | ICD-10-CM | POA: Diagnosis not present

## 2022-01-25 DIAGNOSIS — Z452 Encounter for adjustment and management of vascular access device: Secondary | ICD-10-CM | POA: Insufficient documentation

## 2022-01-25 DIAGNOSIS — Z95828 Presence of other vascular implants and grafts: Secondary | ICD-10-CM

## 2022-01-25 DIAGNOSIS — C50812 Malignant neoplasm of overlapping sites of left female breast: Secondary | ICD-10-CM | POA: Insufficient documentation

## 2022-01-25 DIAGNOSIS — Z79811 Long term (current) use of aromatase inhibitors: Secondary | ICD-10-CM | POA: Diagnosis not present

## 2022-01-25 DIAGNOSIS — Z8572 Personal history of non-Hodgkin lymphomas: Secondary | ICD-10-CM | POA: Diagnosis not present

## 2022-01-25 MED ORDER — HEPARIN SOD (PORK) LOCK FLUSH 100 UNIT/ML IV SOLN
500.0000 [IU] | Freq: Once | INTRAVENOUS | Status: AC
  Administered 2022-01-25: 500 [IU] via INTRAVENOUS
  Filled 2022-01-25: qty 5

## 2022-01-25 MED ORDER — SODIUM CHLORIDE 0.9% FLUSH
10.0000 mL | Freq: Once | INTRAVENOUS | Status: AC
  Administered 2022-01-25: 10 mL via INTRAVENOUS
  Filled 2022-01-25: qty 10

## 2022-02-01 ENCOUNTER — Ambulatory Visit
Admission: RE | Admit: 2022-02-01 | Discharge: 2022-02-01 | Disposition: A | Discharge status: Home / Self Care | Payer: Medicare HMO | Source: Ambulatory Visit | Attending: Internal Medicine | Admitting: Internal Medicine

## 2022-02-01 DIAGNOSIS — Z9012 Acquired absence of left breast and nipple: Secondary | ICD-10-CM | POA: Insufficient documentation

## 2022-02-01 DIAGNOSIS — C50812 Malignant neoplasm of overlapping sites of left female breast: Secondary | ICD-10-CM | POA: Diagnosis present

## 2022-02-01 DIAGNOSIS — Z1231 Encounter for screening mammogram for malignant neoplasm of breast: Secondary | ICD-10-CM | POA: Diagnosis not present

## 2022-02-01 DIAGNOSIS — Z171 Estrogen receptor negative status [ER-]: Secondary | ICD-10-CM | POA: Insufficient documentation

## 2022-03-01 ENCOUNTER — Inpatient Hospital Stay: Payer: Medicare Other | Admitting: Internal Medicine

## 2022-03-01 ENCOUNTER — Inpatient Hospital Stay: Payer: Medicare Other

## 2022-03-02 ENCOUNTER — Inpatient Hospital Stay (HOSPITAL_BASED_OUTPATIENT_CLINIC_OR_DEPARTMENT_OTHER): Payer: Medicare Other | Admitting: Internal Medicine

## 2022-03-02 ENCOUNTER — Encounter: Payer: Self-pay | Admitting: Internal Medicine

## 2022-03-02 ENCOUNTER — Inpatient Hospital Stay: Payer: Medicare Other | Attending: Internal Medicine

## 2022-03-02 VITALS — BP 159/62 | HR 73 | Temp 97.0°F | Resp 18 | Wt 129.7 lb

## 2022-03-02 DIAGNOSIS — Z171 Estrogen receptor negative status [ER-]: Secondary | ICD-10-CM

## 2022-03-02 DIAGNOSIS — Z79811 Long term (current) use of aromatase inhibitors: Secondary | ICD-10-CM | POA: Insufficient documentation

## 2022-03-02 DIAGNOSIS — R918 Other nonspecific abnormal finding of lung field: Secondary | ICD-10-CM | POA: Diagnosis not present

## 2022-03-02 DIAGNOSIS — C50812 Malignant neoplasm of overlapping sites of left female breast: Secondary | ICD-10-CM

## 2022-03-02 DIAGNOSIS — Z95828 Presence of other vascular implants and grafts: Secondary | ICD-10-CM

## 2022-03-02 LAB — COMPREHENSIVE METABOLIC PANEL
ALT: 14 U/L (ref 0–44)
AST: 23 U/L (ref 15–41)
Albumin: 3.8 g/dL (ref 3.5–5.0)
Alkaline Phosphatase: 65 U/L (ref 38–126)
Anion gap: 9 (ref 5–15)
BUN: 12 mg/dL (ref 8–23)
CO2: 28 mmol/L (ref 22–32)
Calcium: 8.6 mg/dL — ABNORMAL LOW (ref 8.9–10.3)
Chloride: 101 mmol/L (ref 98–111)
Creatinine, Ser: 0.63 mg/dL (ref 0.44–1.00)
GFR, Estimated: >60 mL/min (ref >60)
Glucose, Bld: 103 mg/dL — ABNORMAL HIGH (ref 70–99)
Potassium: 3.3 mmol/L — ABNORMAL LOW (ref 3.5–5.1)
Sodium: 138 mmol/L (ref 135–145)
Total Bilirubin: 0.4 mg/dL (ref 0.3–1.2)
Total Protein: 7.5 g/dL (ref 6.5–8.1)

## 2022-03-02 LAB — CBC WITH DIFFERENTIAL/PLATELET
Abs Immature Granulocytes: 0.01 10*3/uL (ref 0.00–0.07)
Basophils Absolute: 0 10*3/uL (ref 0.0–0.1)
Basophils Relative: 0 %
Eosinophils Absolute: 0 10*3/uL (ref 0.0–0.5)
Eosinophils Relative: 1 %
HCT: 35.7 % — ABNORMAL LOW (ref 36.0–46.0)
Hemoglobin: 11.7 g/dL — ABNORMAL LOW (ref 12.0–15.0)
Immature Granulocytes: 0 %
Lymphocytes Relative: 34 %
Lymphs Abs: 1.8 10*3/uL (ref 0.7–4.0)
MCH: 30 pg (ref 26.0–34.0)
MCHC: 32.8 g/dL (ref 30.0–36.0)
MCV: 91.5 fL (ref 80.0–100.0)
Monocytes Absolute: 0.9 10*3/uL (ref 0.1–1.0)
Monocytes Relative: 16 %
Neutro Abs: 2.6 10*3/uL (ref 1.7–7.7)
Neutrophils Relative %: 49 %
Platelets: 238 10*3/uL (ref 150–400)
RBC: 3.9 MIL/uL (ref 3.87–5.11)
RDW: 13 % (ref 11.5–15.5)
WBC: 5.4 10*3/uL (ref 4.0–10.5)
nRBC: 0 % (ref 0.0–0.2)

## 2022-03-02 MED ORDER — SODIUM CHLORIDE 0.9% FLUSH
10.0000 mL | Freq: Once | INTRAVENOUS | Status: AC
  Administered 2022-03-02: 10 mL via INTRAVENOUS
  Filled 2022-03-02: qty 10

## 2022-03-02 MED ORDER — HEPARIN SOD (PORK) LOCK FLUSH 100 UNIT/ML IV SOLN
500.0000 [IU] | Freq: Once | INTRAVENOUS | Status: AC
  Administered 2022-03-02: 500 [IU] via INTRAVENOUS
  Filled 2022-03-02: qty 5

## 2022-03-02 NOTE — Progress Notes (Signed)
Patient denies new problems/concerns today.   °

## 2022-03-02 NOTE — Progress Notes (Signed)
[place] OFFICE PROGRESS NOTE  Patient Care Team: Albina Billet, MD as PCP - General (Internal Medicine) Bary Castilla, Forest Gleason, MD as Consulting Physician (General Surgery) Albina Billet, MD (Internal Medicine) Cammie Sickle, MD as Consulting Physician (Hematology and Oncology)   SUMMARY OF ONCOLOGIC HISTORY:  Oncology History Overview Note  # AUG 2015- LEFT BREAST  STAGE IIB [pT2pN1a]ER-NEG; PR- 1-10%; Her 2 NEU POS; Started neo-adj chemo- AC x4 [lung-MALT lymphoma]; Min response Breast tumor- Lumpec & ALND- T= 3.2cm; N= 1/11LN. 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  Accompanied by family/husband.  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  Continues to be asymptomatic.  No worsening cough or shortness of breath.  Denies any nausea vomiting headache.  No fevers or chills.  No new lumps or bumps.   Review of Systems  Constitutional:  Positive for malaise/fatigue. Negative for chills, diaphoresis and fever.  HENT:  Negative for  nosebleeds and sore throat.   Eyes:  Negative for double vision.  Respiratory:  Negative for cough, hemoptysis, sputum production, shortness of breath and wheezing.   Cardiovascular:  Negative for chest pain, palpitations, orthopnea and leg swelling.  Gastrointestinal:  Negative for abdominal pain, blood in stool, constipation, diarrhea, heartburn, melena, nausea and vomiting.  Genitourinary:  Negative for dysuria, frequency and urgency.  Musculoskeletal:  Positive for back pain and joint pain.  Skin: Negative.  Negative for itching and rash.  Neurological:  Negative for dizziness, tingling, focal weakness, weakness and headaches.  Endo/Heme/Allergies:  Does not bruise/bleed easily.  Psychiatric/Behavioral:  Negative for depression. The patient is not nervous/anxious and does not have insomnia.      PAST MEDICAL HISTORY :  Past Medical History:  Diagnosis Date   Breast cancer ([place]) 2015   left breast cancer   Breast cancer metastasized to axillary lymph node ([place]) August 2015   T2, N1, ER negative, PR negative, HER-2 amplified. 2 cm axillary node. One of 11 nodes positive on post-adjuvant chemotherapy axillary dissection. 3+ centimeter tumor.   Hyperlipemia    Hyperlipidemia    Hypertension    Lymphedema of upper extremity following lymphadenectomy August 2015   Left upper extremity.   MALT lymphoma ([place])    Murmur    Osteoporosis    Personal history of chemotherapy     PAST SURGICAL HISTORY :   Past Surgical History:  Procedure Laterality Date   ABDOMINAL HYSTERECTOMY     APPENDECTOMY     BREAST SURGERY Left 02/25/14   Modified radical mastectomy, T2 N1. Grade 3, ER, PR negative, HER-2/neu 3+.   COLONOSCOPY WITH PROPOFOL N/A 01/26/2016  Procedure: COLONOSCOPY WITH PROPOFOL;  Surgeon: [name], MD;  Location: [facility] ENDOSCOPY;  Service: Endoscopy;  Laterality: N/A;   MASTECTOMY Left 02-25-14   Dr Bary Castilla    FAMILY HISTORY :   Family History  Problem Relation Age  of Onset   Hypertension Mother    Hypertension Maternal Aunt    Hypertension Maternal Uncle    Hypertension Maternal Aunt    Hypertension Maternal Aunt    Breast cancer Neg Hx     SOCIAL HISTORY:   Social History   Tobacco Use   Smoking status: Never   Smokeless tobacco: Never  Vaping Use   Vaping Use: Never used  Substance Use Topics   Alcohol use: No   Drug use: No    ALLERGIES:  has No Known Allergies.  MEDICATIONS:  Current Outpatient Medications  Medication Sig Dispense Refill   alendronate (FOSAMAX) 70 MG tablet Take 70 mg by mouth once a week. Take with a full glass of water on an empty stomach.     amLODipine (NORVASC) 2.5 MG tablet Take 2.5 mg by mouth daily.     anastrozole (ARIMIDEX) 1 MG tablet TAKE 1 TABLET BY MOUTH ONCE DAILY 90 tablet 3   aspirin 81 MG tablet Take 81 mg by mouth daily.     cholecalciferol (VITAMIN D) 1000 UNITS tablet Take 1,000 Units by mouth daily.     lidocaine-prilocaine (EMLA) cream Apply 1 application. topically as needed. Apply to port and cover with saran wrap 1-2 hours prior to port access 30 g 4   losartan-hydrochlorothiazide (HYZAAR) 100-25 MG per tablet Take 1 tablet by mouth daily.     Multiple Vitamins-Minerals (MULTIVITAMIN WITH MINERALS) tablet Take 1 tablet by mouth daily.     potassium chloride SA (KLOR-CON M) 20 MEQ tablet Take 20 mEq by mouth daily.     simvastatin (ZOCOR) 40 MG tablet Take 40 mg by mouth at bedtime.     No current facility-administered medications for this visit.   Facility-Administered Medications Ordered in Other Visits  Medication Dose Route Frequency Provider Last Rate Last Admin   sodium chloride flush (NS) 0.9 % injection 10 mL  10 mL Intravenous PRN Cammie Sickle, MD   10 mL at 10/19/16 1054   sodium chloride flush (NS) 0.9 % injection 10 mL  10 mL Intravenous PRN Cammie Sickle, MD   10 mL at 12/13/17 1050    PHYSICAL EXAMINATION: ECOG PERFORMANCE STATUS: 0 - Asymptomatic  BP  (!) 159/62 Comment: Recheck  Pulse 73   Temp (!) 97 F (36.1 C) (Tympanic)   Resp 18   Wt 129 lb 11.2 oz (58.8 kg)   BMI 25.33 kg/m   Filed Weights   03/02/22 1000  Weight: 129 lb 11.2 oz (58.8 kg)     Physical Exam Constitutional:      Comments: Frail-appearing female patient.  She is walking herself.  Alone.  HENT:     Head: Normocephalic and atraumatic.     Mouth/Throat:     Pharynx: No oropharyngeal exudate.  Eyes:     Pupils: Pupils are equal, round, and reactive to light.  Cardiovascular:     Rate and Rhythm: Normal rate and regular rhythm.  Pulmonary:     Effort: No respiratory distress.     Breath sounds: No wheezing.  Abdominal:     General: Bowel sounds are normal. There is no distension.     Palpations: Abdomen is soft. There is no mass.  Tenderness: There is no abdominal tenderness. There is no guarding or rebound.  Musculoskeletal:        General: No tenderness. Normal range of motion.     Cervical back: Normal range of motion and neck supple.  Skin:    General: Skin is warm.  Neurological:     Mental Status: She is alert and oriented to person, place, and time.  Psychiatric:        Mood and Affect: Affect normal.      LABORATORY DATA:  I have reviewed the data as listed    Component Value Date/Time   NA 138 03/02/2022 0935   NA 136 05/27/2014 0942   K 3.3 (L) 03/02/2022 0935   K 3.4 (L) 05/27/2014 0942   CL 101 03/02/2022 0935   CL 102 05/27/2014 0942   CO2 28 03/02/2022 0935   CO2 29 05/27/2014 0942   GLUCOSE 103 (H) 03/02/2022 0935   GLUCOSE 106 (H) 05/27/2014 0942   BUN 12 03/02/2022 0935   BUN 13 05/27/2014 0942   CREATININE 0.63 03/02/2022 0935   CREATININE 0.58 05/27/2014 0942   CALCIUM 8.6 (L) 03/02/2022 0935   CALCIUM 8.6 (L) 05/27/2014 0942   PROT 7.5 03/02/2022 0935   PROT 6.8 05/27/2014 0942   ALBUMIN 3.8 03/02/2022 0935   ALBUMIN 3.8 05/27/2014 0942   AST 23 03/02/2022 0935   AST 18 05/27/2014 0942   ALT 14  03/02/2022 0935   ALT 14 05/27/2014 0942   ALKPHOS 65 03/02/2022 0935   ALKPHOS 69 05/27/2014 0942   BILITOT 0.4 03/02/2022 0935   BILITOT 0.6 05/27/2014 0942   GFRNONAA >60 03/02/2022 0935   GFRNONAA >60 05/27/2014 0942   GFRAA >60 10/22/2019 1457   GFRAA >60 05/27/2014 0942    No results found for: "SPEP", "UPEP"  Lab Results  Component Value Date   WBC 5.4 03/02/2022   NEUTROABS 2.6 03/02/2022   HGB 11.7 (L) 03/02/2022   HCT 35.7 (L) 03/02/2022   MCV 91.5 03/02/2022   PLT 238 03/02/2022      Chemistry      Component Value Date/Time   NA 138 03/02/2022 0935   NA 136 05/27/2014 0942   K 3.3 (L) 03/02/2022 0935   K 3.4 (L) 05/27/2014 0942   CL 101 03/02/2022 0935   CL 102 05/27/2014 0942   CO2 28 03/02/2022 0935   CO2 29 05/27/2014 0942   BUN 12 03/02/2022 0935   BUN 13 05/27/2014 0942   CREATININE 0.63 03/02/2022 0935   CREATININE 0.58 05/27/2014 0942      Component Value Date/Time   CALCIUM 8.6 (L) 03/02/2022 0935   CALCIUM 8.6 (L) 05/27/2014 0942   ALKPHOS 65 03/02/2022 0935   ALKPHOS 69 05/27/2014 0942   AST 23 03/02/2022 0935   AST 18 05/27/2014 0942   ALT 14 03/02/2022 0935   ALT 14 05/27/2014 0942   BILITOT 0.4 03/02/2022 0935   BILITOT 0.6 05/27/2014 0942       ASSESSMENT & PLAN:   Carcinoma of overlapping sites of left breast in female, estrogen receptor negative ([place]) # 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 On anastrazole. CT scan- JAN 2023-stable/0.7 cm subpectoral lymph node; but PET JAN 2023- NED; see below.   Continue anastrozole for now. Stable- dec 2023 - wnl.   #Right upper lobe/middle lobe- MALT [NO RT] - JAN 2023- PET scan-Chronic right upper lobe consolidation and bronchiectasis in the  setting of diffuse pulmonary nodularity. Favor chronic atypical infection. Chronic, indolent lymphoma felt less likely. If this is a clinical concern, bronchoscopy could be  performed. S/p evaluation with Dr.A- declined bronch.  Continue surveillance as patient is asymptomatic. Stable; will repeat a CT sacn to ensure stability.    # HTN- poorly controlled in clinic- systolic [id number]; at home 120s.   Stable-   # Intermittently Elevated tumor marker CA 27-29-OCT 2023- 49; Again no clear trend noted [facility] 2023- 49]; await repeat tumor marker testing. . Monitor for now. Stable-  # OSTEOPENIA- JULy 2022-T-score of -1.5; continue Fosamax with vitamin D.'  Stable.  # port flush every 3 months; keep for now.   # DISPOSITION:  # follow up in 3 months-MD/ labs-cbc/cmp/ ca 27-29/port flush; CT chest prior--Dr.B.   Cc;Dr.Tate.      Cammie Sickle, MD 03/02/2022 10:55 AM

## 2022-03-02 NOTE — Assessment & Plan Note (Addendum)
#  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 On anastrazole. CT scan- JAN 2023-stable/0.7 cm subpectoral lymph node; but PET JAN 2023- NED; see below.   Continue anastrozole for now. Stable- dec 2023 - wnl.   #Right upper lobe/middle lobe- MALT [NO RT] - JAN 2023- PET scan-Chronic right upper lobe consolidation and bronchiectasis in the setting of diffuse pulmonary nodularity. Favor chronic atypical infection. Chronic, indolent lymphoma felt less likely. If this is a clinical concern, bronchoscopy could be performed. S/p evaluation with Dr.A- declined bronch.  Continue surveillance as patient is asymptomatic. Stable; will repeat a CT sacn to ensure stability.    # HTN- poorly controlled in clinic- systolic [id number]; at home 120s.   Stable-   # Intermittently Elevated tumor marker CA 27-29-OCT 2023- 49; Again no clear trend noted [facility] 2023- 49]; await repeat tumor marker testing. . Monitor for now. Stable-  # OSTEOPENIA- JULy 2022-T-score of -1.5; continue Fosamax with vitamin D.'  Stable.  # port flush every 3 months; keep for now.   # DISPOSITION:  # follow up in 3 months-MD/ labs-cbc/cmp/ ca 27-29/port flush; CT chest prior--Dr.B.   Cc;Dr.Tate.

## 2022-03-03 LAB — CANCER ANTIGEN 27.29: CA 27.29: 44.3 U/mL — ABNORMAL HIGH (ref 0.0–38.6)

## 2022-04-19 DIAGNOSIS — H524 Presbyopia: Secondary | ICD-10-CM | POA: Diagnosis not present

## 2022-04-19 DIAGNOSIS — H35033 Hypertensive retinopathy, bilateral: Secondary | ICD-10-CM | POA: Diagnosis not present

## 2022-05-15 ENCOUNTER — Other Ambulatory Visit: Payer: Self-pay | Admitting: Internal Medicine

## 2022-05-15 DIAGNOSIS — C50912 Malignant neoplasm of unspecified site of left female breast: Secondary | ICD-10-CM

## 2022-05-29 ENCOUNTER — Ambulatory Visit
Admission: RE | Admit: 2022-05-29 | Discharge: 2022-05-29 | Disposition: A | Discharge status: Home / Self Care | Payer: Medicare Other | Source: Ambulatory Visit | Attending: Internal Medicine | Admitting: Internal Medicine

## 2022-05-29 DIAGNOSIS — R918 Other nonspecific abnormal finding of lung field: Secondary | ICD-10-CM | POA: Insufficient documentation

## 2022-05-29 DIAGNOSIS — Z171 Estrogen receptor negative status [ER-]: Secondary | ICD-10-CM

## 2022-05-29 DIAGNOSIS — C50812 Malignant neoplasm of overlapping sites of left female breast: Secondary | ICD-10-CM | POA: Insufficient documentation

## 2022-05-29 DIAGNOSIS — J479 Bronchiectasis, uncomplicated: Secondary | ICD-10-CM | POA: Diagnosis not present

## 2022-05-29 MED ORDER — IOHEXOL 300 MG/ML  SOLN
75.0000 mL | Freq: Once | INTRAMUSCULAR | Status: AC | PRN
  Administered 2022-05-29: 75 mL via INTRAVENOUS

## 2022-06-01 ENCOUNTER — Other Ambulatory Visit: Payer: Medicare HMO

## 2022-06-01 ENCOUNTER — Ambulatory Visit: Payer: Medicare HMO | Admitting: Internal Medicine

## 2022-06-08 ENCOUNTER — Encounter: Payer: Self-pay | Admitting: Internal Medicine

## 2022-06-08 ENCOUNTER — Inpatient Hospital Stay: Payer: Medicare Other | Attending: Internal Medicine

## 2022-06-08 ENCOUNTER — Inpatient Hospital Stay (HOSPITAL_BASED_OUTPATIENT_CLINIC_OR_DEPARTMENT_OTHER): Payer: Medicare Other | Admitting: Internal Medicine

## 2022-06-08 VITALS — BP 168/59 | HR 74 | Temp 98.1°F | Ht 60.0 in | Wt 129.8 lb

## 2022-06-08 DIAGNOSIS — Z9221 Personal history of antineoplastic chemotherapy: Secondary | ICD-10-CM | POA: Insufficient documentation

## 2022-06-08 DIAGNOSIS — C884 Extranodal marginal zone B-cell lymphoma of mucosa-associated lymphoid tissue [MALT-lymphoma]: Secondary | ICD-10-CM | POA: Diagnosis not present

## 2022-06-08 DIAGNOSIS — Z79811 Long term (current) use of aromatase inhibitors: Secondary | ICD-10-CM | POA: Insufficient documentation

## 2022-06-08 DIAGNOSIS — Z171 Estrogen receptor negative status [ER-]: Secondary | ICD-10-CM

## 2022-06-08 DIAGNOSIS — Z9012 Acquired absence of left breast and nipple: Secondary | ICD-10-CM | POA: Diagnosis not present

## 2022-06-08 DIAGNOSIS — C50812 Malignant neoplasm of overlapping sites of left female breast: Secondary | ICD-10-CM

## 2022-06-08 DIAGNOSIS — I10 Essential (primary) hypertension: Secondary | ICD-10-CM | POA: Diagnosis not present

## 2022-06-08 DIAGNOSIS — E876 Hypokalemia: Secondary | ICD-10-CM | POA: Insufficient documentation

## 2022-06-08 DIAGNOSIS — Z7982 Long term (current) use of aspirin: Secondary | ICD-10-CM | POA: Insufficient documentation

## 2022-06-08 DIAGNOSIS — M8580 Other specified disorders of bone density and structure, unspecified site: Secondary | ICD-10-CM | POA: Insufficient documentation

## 2022-06-08 DIAGNOSIS — Z79899 Other long term (current) drug therapy: Secondary | ICD-10-CM | POA: Insufficient documentation

## 2022-06-08 LAB — CBC WITH DIFFERENTIAL/PLATELET
Abs Immature Granulocytes: 0.01 10*3/uL (ref 0.00–0.07)
Basophils Absolute: 0 10*3/uL (ref 0.0–0.1)
Basophils Relative: 1 %
Eosinophils Absolute: 0.1 10*3/uL (ref 0.0–0.5)
Eosinophils Relative: 1 %
HCT: 36.2 % (ref 36.0–46.0)
Hemoglobin: 11.9 g/dL — ABNORMAL LOW (ref 12.0–15.0)
Immature Granulocytes: 0 %
Lymphocytes Relative: 40 %
Lymphs Abs: 2.2 10*3/uL (ref 0.7–4.0)
MCH: 30.3 pg (ref 26.0–34.0)
MCHC: 32.9 g/dL (ref 30.0–36.0)
MCV: 92.1 fL (ref 80.0–100.0)
Monocytes Absolute: 0.9 10*3/uL (ref 0.1–1.0)
Monocytes Relative: 17 %
Neutro Abs: 2.3 10*3/uL (ref 1.7–7.7)
Neutrophils Relative %: 41 %
Platelets: 236 10*3/uL (ref 150–400)
RBC: 3.93 MIL/uL (ref 3.87–5.11)
RDW: 12.9 % (ref 11.5–15.5)
WBC: 5.5 10*3/uL (ref 4.0–10.5)
nRBC: 0 % (ref 0.0–0.2)

## 2022-06-08 LAB — COMPREHENSIVE METABOLIC PANEL
ALT: 14 U/L (ref 0–44)
AST: 22 U/L (ref 15–41)
Albumin: 3.9 g/dL (ref 3.5–5.0)
Alkaline Phosphatase: 73 U/L (ref 38–126)
Anion gap: 8 (ref 5–15)
BUN: 13 mg/dL (ref 8–23)
CO2: 27 mmol/L (ref 22–32)
Calcium: 8.9 mg/dL (ref 8.9–10.3)
Chloride: 103 mmol/L (ref 98–111)
Creatinine, Ser: 0.64 mg/dL (ref 0.44–1.00)
GFR, Estimated: >60 mL/min (ref >60)
Glucose, Bld: 94 mg/dL (ref 70–99)
Potassium: 3.1 mmol/L — ABNORMAL LOW (ref 3.5–5.1)
Sodium: 138 mmol/L (ref 135–145)
Total Bilirubin: 0.2 mg/dL — ABNORMAL LOW (ref 0.3–1.2)
Total Protein: 7.5 g/dL (ref 6.5–8.1)

## 2022-06-08 MED ORDER — HEPARIN SOD (PORK) LOCK FLUSH 100 UNIT/ML IV SOLN
500.0000 [IU] | Freq: Once | INTRAVENOUS | Status: AC
  Administered 2022-06-08: 500 [IU] via INTRAVENOUS
  Filled 2022-06-08: qty 5

## 2022-06-08 NOTE — Progress Notes (Signed)
[facility] OFFICE PROGRESS NOTE  Patient Care Team: Andrea Shaggy, MD as PCP - General (Internal Medicine) Andrea Rodgers, Andrea Pew, MD as Consulting Physician (General Surgery) Andrea Shaggy, MD (Internal Medicine) Andrea Coder, MD as Consulting Physician (Hematology and Oncology)   SUMMARY OF ONCOLOGIC HISTORY:  Oncology History Overview Note  # AUG 2015- LEFT BREAST  STAGE IIB [pT2pN1a]ER-NEG; PR- 1-10%; Her 2 NEU POS; Started neo-adj chemo- AC x4 [lung-MALT lymphoma]; Min response Breast tumor- Lumpec & ALND- T= 3.2cm; N= 1/11LN. 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 STABLE band like opacity in RUL/RML MAY 2017 CT-NED  # SEP21st 2016- MUGA 61 %; DEC 22nd 2016- 65%  # LEFT UE LYMPHEDEMA s/p PT ----------------------------------------------------    DIAGNOSIS: LEFT BREAST CA  STAGE:  II ;GOALS: curative  CURRENT/MOST RECENT THERAPY: arimidex    Mucosa-associated lymphoid tissue (MALT) lymphoma  04/21/2015 Initial Diagnosis   Mucosa-associated lymphoid tissue (MALT) lymphoma (HCC)   Carcinoma of overlapping sites of left breast in female, estrogen receptor negative  09/23/2015 Initial Diagnosis   Cancer of overlapping sites of left female breast ([facility])     INTERVAL HISTORY: Ambulating independently.  Alone.   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  Continues to be asymptomatic.  No worsening cough or shortness of breath.  Denies any nausea vomiting headache.  No fevers or chills.  No new lumps or bumps.   Review of Systems  Constitutional:  Positive for malaise/fatigue. Negative for chills, diaphoresis and fever.  HENT:  Negative for nosebleeds  and sore throat.   Eyes:  Negative for double vision.  Respiratory:  Negative for cough, hemoptysis, sputum production, shortness of breath and wheezing.   Cardiovascular:  Negative for chest pain, palpitations, orthopnea and leg swelling.  Gastrointestinal:  Negative for abdominal pain, blood in stool, constipation, diarrhea, heartburn, melena, nausea and vomiting.  Genitourinary:  Negative for dysuria, frequency and urgency.  Musculoskeletal:  Positive for back pain and joint pain.  Skin: Negative.  Negative for itching and rash.  Neurological:  Negative for dizziness, tingling, focal weakness, weakness and headaches.  Endo/Heme/Allergies:  Does not bruise/bleed easily.  Psychiatric/Behavioral:  Negative for depression. The patient is not nervous/anxious and does not have insomnia.      PAST MEDICAL HISTORY :  Past Medical History:  Diagnosis Date   Breast cancer 2015   left breast cancer   Breast cancer metastasized to axillary lymph node August 2015   T2, N1, ER negative, PR negative, HER-2 amplified. 2 cm axillary node. One of 11 nodes positive on post-adjuvant chemotherapy axillary dissection. 3+ centimeter tumor.   Hyperlipemia    Hyperlipidemia    Hypertension    Lymphedema of upper extremity following lymphadenectomy August 2015   Left upper extremity.   MALT lymphoma    Murmur    Osteoporosis    Personal history of chemotherapy     PAST SURGICAL HISTORY :   Past Surgical History:  Procedure Laterality Date   ABDOMINAL HYSTERECTOMY     APPENDECTOMY     BREAST SURGERY Left 02/25/14   Modified radical mastectomy, T2 N1. Grade 3, ER, PR negative, HER-2/neu 3+.   COLONOSCOPY WITH PROPOFOL N/A 01/26/2016  Procedure: COLONOSCOPY WITH PROPOFOL;  Surgeon: Andrea Mayotte, MD;  Location[facility] ENDOSCOPY;  Service: Endoscopy;  Laterality: N/A;   MASTECTOMY Left 02-25-14   Dr Andrea Rodgers    FAMILY HISTORY :   Family History  Problem Relation Age of Onset   Hypertension  Mother    Hypertension Maternal Aunt    Hypertension Maternal Uncle    Hypertension Maternal Aunt    Hypertension Maternal Aunt    Breast cancer Neg Hx     SOCIAL HISTORY:   Social History   Tobacco Use   Smoking status: Never   Smokeless tobacco: Never  Vaping Use   Vaping Use: Never used  Substance Use Topics   Alcohol use: No   Drug use: No    ALLERGIES:  has No Known Allergies.  MEDICATIONS:  Current Outpatient Medications  Medication Sig Dispense Refill   alendronate (FOSAMAX) 70 MG tablet Take 70 mg by mouth once a week. Take with a full glass of water on an empty stomach.     amLODipine (NORVASC) 2.5 MG tablet Take 2.5 mg by mouth daily.     anastrozole (ARIMIDEX) 1 MG tablet TAKE 1 TABLET BY MOUTH ONCE DAILY 90 tablet 3   aspirin 81 MG tablet Take 81 mg by mouth daily.     cholecalciferol (VITAMIN D) 1000 UNITS tablet Take 1,000 Units by mouth daily.     lidocaine-prilocaine (EMLA) cream Apply 1 application. topically as needed. Apply to port and cover with saran wrap 1-2 hours prior to port access 30 g 4   losartan-hydrochlorothiazide (HYZAAR) 100-25 MG per tablet Take 1 tablet by mouth daily.     Multiple Vitamins-Minerals (MULTIVITAMIN WITH MINERALS) tablet Take 1 tablet by mouth daily.     potassium chloride SA (KLOR-CON M) 20 MEQ tablet Take 20 mEq by mouth daily.     simvastatin (ZOCOR) 40 MG tablet Take 40 mg by mouth at bedtime.     No current facility-administered medications for this visit.   Facility-Administered Medications Ordered in Other Visits  Medication Dose Route Frequency Provider Last Rate Last Admin   sodium chloride flush (NS) 0.9 % injection 10 mL  10 mL Intravenous PRN Andrea Coder, MD   10 mL at 10/19/16 1054   sodium chloride flush (NS) 0.9 % injection 10 mL  10 mL Intravenous PRN Andrea Coder, MD   10 mL at 12/13/17 1050    PHYSICAL EXAMINATION: ECOG PERFORMANCE STATUS: 0 - Asymptomatic  BP (!) 168/59 (BP Location:  Right Arm, Patient Position: Sitting, Cuff Size: Normal)   Pulse 74   Temp 98.1 F (36.7 C) (Tympanic)   Ht 5' (1.524 m)   Wt 129 lb 12.8 oz (58.9 kg)   SpO2 100%   BMI 25.35 kg/m   Filed Weights   06/08/22 0950  Weight: 129 lb 12.8 oz (58.9 kg)     Physical Exam Constitutional:      Comments: Frail-appearing female patient.  She is walking herself.  Alone.  HENT:     Head: Normocephalic and atraumatic.     Mouth/Throat:     Pharynx: No oropharyngeal exudate.  Eyes:     Pupils: Pupils are equal, round, and reactive to light.  Cardiovascular:     Rate and Rhythm: Normal rate and regular rhythm.  Pulmonary:     Effort: No respiratory distress.     Breath sounds: No wheezing.  Abdominal:     General: Bowel sounds are normal. There is no distension.  Palpations: Abdomen is soft. There is no mass.     Tenderness: There is no abdominal tenderness. There is no guarding or rebound.  Musculoskeletal:        General: No tenderness. Normal range of motion.     Cervical back: Normal range of motion and neck supple.  Skin:    General: Skin is warm.  Neurological:     Mental Status: She is alert and oriented to person, place, and time.  Psychiatric:        Mood and Affect: Affect normal.      LABORATORY DATA:  I have reviewed the data as listed    Component Value Date/Time   NA 138 06/08/2022 0945   NA 136 05/27/2014 0942   K 3.1 (L) 06/08/2022 0945   K 3.4 (L) 05/27/2014 0942   CL 103 06/08/2022 0945   CL 102 05/27/2014 0942   CO2 27 06/08/2022 0945   CO2 29 05/27/2014 0942   GLUCOSE 94 06/08/2022 0945   GLUCOSE 106 (H) 05/27/2014 0942   BUN 13 06/08/2022 0945   BUN 13 05/27/2014 0942   CREATININE 0.64 06/08/2022 0945   CREATININE 0.58 05/27/2014 0942   CALCIUM 8.9 06/08/2022 0945   CALCIUM 8.6 (L) 05/27/2014 0942   PROT 7.5 06/08/2022 0945   PROT 6.8 05/27/2014 0942   ALBUMIN 3.9 06/08/2022 0945   ALBUMIN 3.8 05/27/2014 0942   AST 22 06/08/2022 0945    AST 18 05/27/2014 0942   ALT 14 06/08/2022 0945   ALT 14 05/27/2014 0942   ALKPHOS 73 06/08/2022 0945   ALKPHOS 69 05/27/2014 0942   BILITOT 0.2 (L) 06/08/2022 0945   BILITOT 0.6 05/27/2014 0942   GFRNONAA >60 06/08/2022 0945   GFRNONAA >60 05/27/2014 0942   GFRAA >60 10/22/2019 1457   GFRAA >60 05/27/2014 0942    No results found for: "SPEP", "UPEP"  Lab Results  Component Value Date   WBC 5.5 06/08/2022   NEUTROABS 2.3 06/08/2022   HGB 11.9 (L) 06/08/2022   HCT 36.2 06/08/2022   MCV 92.1 06/08/2022   PLT 236 06/08/2022      Chemistry      Component Value Date/Time   NA 138 06/08/2022 0945   NA 136 05/27/2014 0942   K 3.1 (L) 06/08/2022 0945   K 3.4 (L) 05/27/2014 0942   CL 103 06/08/2022 0945   CL 102 05/27/2014 0942   CO2 27 06/08/2022 0945   CO2 29 05/27/2014 0942   BUN 13 06/08/2022 0945   BUN 13 05/27/2014 0942   CREATININE 0.64 06/08/2022 0945   CREATININE 0.58 05/27/2014 0942      Component Value Date/Time   CALCIUM 8.9 06/08/2022 0945   CALCIUM 8.6 (L) 05/27/2014 0942   ALKPHOS 73 06/08/2022 0945   ALKPHOS 69 05/27/2014 0942   AST 22 06/08/2022 0945   AST 18 05/27/2014 0942   ALT 14 06/08/2022 0945   ALT 14 05/27/2014 0942   BILITOT 0.2 (L) 06/08/2022 0945   BILITOT 0.6 05/27/2014 0942       ASSESSMENT & PLAN:   Carcinoma of overlapping sites of left breast in female, estrogen receptor negative (HCC) # 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 On anastrazole. CT scan- JAN 2023-stable/0.7 cm subpectoral lymph node; but PET JAN 2023- NED; see below.   Continue anastrozole for now. Stable.   # Right upper lobe/middle lobe- MALT [NO RT] - S/p evaluation with Dr.A- declined  bronch.  CT chest April 2024-  Chronic right upper lobe cavitary lesion with associated consolidations, bronchiectasis, and nodularity. Increased peribronchovascular consolidations and nodularity are seen  in the posteromedial right lower lobe compared with most recent prior. Findings have progressively worsened when compared with more remote prior exams which is concerning for progression of indolent lymphoma. Chronic atypical infection is an additional consideration.  Additional bilateral scattered small solid pulmonary nodules are seen, most numerous in the left upper lobe, unchanged when compared with the prior exam. Again discussed with pt re: evaluation with pulmonary. Left voice mail for daughter with the plan. Discussed with Dr.Alekerov.    # HTN- poorly controlled in clinic- systolic BP-160s; at home 120s.   Stable-  # Hypokalemia- mild- recommend compliance with Kdur.    # Intermittently Elevated tumor marker CA 27-29-OCT 2023- 49; Again no clear trend noted [facility] 2023- 49]; await repeat tumor marker testing. . Monitor for now. Stable-  # OSTEOPENIA- JULy 2022-T-score of -1.5; continue Fosamax with vitamin D.' Stable-  # port flush every 3 months; keep for now.   # DISPOSITION:  # follow up in 3 months-MD/ labs-cbc/cmp/ ca 27-29/port flush; -Dr.B.   Cc;Dr.Tate.      Andrea Coder, MD 06/08/2022 11:11 AM

## 2022-06-08 NOTE — Assessment & Plan Note (Signed)
#   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 On anastrazole. CT scan- JAN 2023-stable/0.7 cm subpectoral lymph node; but PET JAN 2023- NED; see below.   Continue anastrozole for now. Stable.   # Right upper lobe/middle lobe- MALT [NO RT] - S/p evaluation with Dr.A- declined bronch.  CT chest April 2024-  Chronic right upper lobe cavitary lesion with associated consolidations, bronchiectasis, and nodularity. Increased peribronchovascular consolidations and nodularity are seen in the posteromedial right lower lobe compared with most recent prior. Findings have progressively worsened when compared with more remote prior exams which is concerning for progression of indolent lymphoma. Chronic atypical infection is an additional consideration.  Additional bilateral scattered small solid pulmonary nodules are seen, most numerous in the left upper lobe, unchanged when compared with the prior exam. Again discussed with pt re: evaluation with pulmonary. Left voice mail for daughter with the plan. Discussed with Dr.Alekerov.    # HTN- poorly controlled in clinic- systolic BP-160s; at home 120s.   Stable-  # Hypokalemia- mild- recommend compliance with Kdur.    # Intermittently Elevated tumor marker CA 27-29-OCT 2023- 49; Again no clear trend noted [facility] 2023- 49]; await repeat tumor marker testing. . Monitor for now. Stable-  # OSTEOPENIA- JULy 2022-T-score of -1.5; continue Fosamax with vitamin D.' Stable-  # port flush every 3 months; keep for now.   # DISPOSITION:  # follow up in 3 months-MD/ labs-cbc/cmp/ ca 27-29/port flush; -Dr.B.   Cc;Dr.Tate.

## 2022-06-08 NOTE — Progress Notes (Signed)
Results of CT scan done at [facility].

## 2022-06-09 ENCOUNTER — Other Ambulatory Visit: Payer: Self-pay | Admitting: Internal Medicine

## 2022-06-09 DIAGNOSIS — C50912 Malignant neoplasm of unspecified site of left female breast: Secondary | ICD-10-CM

## 2022-06-12 ENCOUNTER — Telehealth: Payer: Self-pay

## 2022-06-12 DIAGNOSIS — I10 Essential (primary) hypertension: Secondary | ICD-10-CM | POA: Diagnosis not present

## 2022-06-12 DIAGNOSIS — E785 Hyperlipidemia, unspecified: Secondary | ICD-10-CM | POA: Diagnosis not present

## 2022-06-12 NOTE — Telephone Encounter (Signed)
Patient Daughter called stating Dr.Brahmanday called her on 06/08/2022 and she was returning his called. Patient daughter states if the callback could be after 4pm. Callback # [phone].

## 2022-06-13 LAB — CANCER ANTIGEN 27.29: CA 27.29: 48.3 U/mL — ABNORMAL HIGH (ref 0.0–38.6)

## 2022-06-16 DIAGNOSIS — C884 Extranodal marginal zone B-cell lymphoma of mucosa-associated lymphoid tissue [MALT-lymphoma]: Secondary | ICD-10-CM | POA: Diagnosis not present

## 2022-06-16 DIAGNOSIS — R0602 Shortness of breath: Secondary | ICD-10-CM | POA: Diagnosis not present

## 2022-06-16 DIAGNOSIS — R911 Solitary pulmonary nodule: Secondary | ICD-10-CM | POA: Diagnosis not present

## 2022-06-19 ENCOUNTER — Telehealth: Payer: Self-pay | Admitting: *Deleted

## 2022-06-19 DIAGNOSIS — E785 Hyperlipidemia, unspecified: Secondary | ICD-10-CM | POA: Diagnosis not present

## 2022-06-19 DIAGNOSIS — I10 Essential (primary) hypertension: Secondary | ICD-10-CM | POA: Diagnosis not present

## 2022-06-19 NOTE — Telephone Encounter (Signed)
Received FMLA for daughter Andrea Rodgers completed and sent for physician signature

## 2022-06-19 NOTE — Telephone Encounter (Signed)
FMLA signed copied for chart and call to [facility] to let her know that it is ready to be picked up

## 2022-06-22 DIAGNOSIS — K080 Exfoliation of teeth due to systemic causes: Secondary | ICD-10-CM | POA: Diagnosis not present

## 2022-06-30 ENCOUNTER — Other Ambulatory Visit: Payer: Self-pay | Admitting: Pulmonary Disease

## 2022-06-30 DIAGNOSIS — R0602 Shortness of breath: Secondary | ICD-10-CM

## 2022-06-30 DIAGNOSIS — R911 Solitary pulmonary nodule: Secondary | ICD-10-CM

## 2022-07-06 ENCOUNTER — Encounter
Admission: RE | Admit: 2022-07-06 | Discharge: 2022-07-06 | Disposition: A | Discharge status: Home / Self Care | Payer: Medicare Other | Source: Ambulatory Visit | Attending: Pulmonary Disease | Admitting: Pulmonary Disease

## 2022-07-06 VITALS — Ht 65.0 in | Wt 128.1 lb

## 2022-07-06 DIAGNOSIS — Z79899 Other long term (current) drug therapy: Secondary | ICD-10-CM

## 2022-07-06 DIAGNOSIS — I10 Essential (primary) hypertension: Secondary | ICD-10-CM

## 2022-07-06 DIAGNOSIS — Z01810 Encounter for preprocedural cardiovascular examination: Secondary | ICD-10-CM

## 2022-07-06 DIAGNOSIS — Z01812 Encounter for preprocedural laboratory examination: Secondary | ICD-10-CM

## 2022-07-06 DIAGNOSIS — R0602 Shortness of breath: Secondary | ICD-10-CM

## 2022-07-06 HISTORY — DX: Anemia, unspecified: D64.9

## 2022-07-06 NOTE — Pre-Procedure Instructions (Signed)
Pt unable to come in for preop labs and EKG on 5-24 due to pts husband having surgery. PAT is closed on 5-27 so pt will come in on 5-28

## 2022-07-06 NOTE — Patient Instructions (Addendum)
Your procedure is scheduled on:07-14-22 Friday Report to the Registration Desk on the 1st floor of the Medical Mall.Then proceed to the 2nd floor Surgery Desk To find out your arrival time, please call [phone] between 1PM - 3PM on:07-13-22 Thursday If your arrival time is 6:00 am, do not arrive before that time as the Medical Mall entrance doors do not open until 6:00 am.  REMEMBER: Instructions that are not followed completely may result in serious medical risk, up to and including death; or upon the discretion of your surgeon and anesthesiologist your surgery may need to be rescheduled.  Do not eat food after midnight the night before surgery.  No gum chewing or hard candies.  You may however, drink CLEAR liquids up to 2 hours before you are scheduled to arrive for your surgery. Do not drink anything within 2 hours of your scheduled arrival time.  Clear liquids include: - water  - apple juice without pulp - gatorade (not RED colors) - black coffee or tea (Do NOT add milk or creamers to the coffee or tea) Do NOT drink anything that is not on this list.  One week prior to surgery:Stop now 07-06-22 Stop Anti-inflammatories (NSAIDS) such as Advil, Aleve, Ibuprofen, Motrin, Naproxen, Naprosyn and Aspirin based products such as Excedrin, Goody's Powder, BC Powder.You may however, take Tylenol if needed for pain up until the day of surgery. Stop ANY OVER THE COUNTER supplements/vitamins NOW (07-06-22) until after surgery (Multivitamin and Vitamin D)-Continue your Potassium   Continue taking all prescribed medications with the exception of the following: Stop your 81 mg Aspirin 5 days prior to surgery-Last dose will be on 07-08-22 Saturday  TAKE ONLY THESE MEDICATIONS THE MORNING OF SURGERY WITH A SIP OF WATER: -amLODipine (NORVASC)  -anastrozole (ARIMIDEX)  -potassium chloride SA (KLOR-CON M)  -simvastatin (ZOCOR)   Continue your losartan-hydrochlorothiazide (HYZAAR) up until the day  prior to surgery-Do NOT take the morning of surgery  No Alcohol for 24 hours before or after surgery.  No Smoking including e-cigarettes for 24 hours before surgery.  No chewable tobacco products for at least 6 hours before surgery.  No nicotine patches on the day of surgery.  Do not use any "recreational" drugs for at least a week (preferably 2 weeks) before your surgery.  Please be advised that the combination of cocaine and anesthesia may have negative outcomes, up to and including death. If you test positive for cocaine, your surgery will be cancelled.  On the morning of surgery brush your teeth with toothpaste and water, you may rinse your mouth with mouthwash if you wish. Do not swallow any toothpaste or mouthwash.  Do not wear jewelry, make-up, hairpins, clips or nail polish.  Do not wear lotions, powders, or perfumes.   Do not shave body hair from the neck down 48 hours before surgery.  Contact lenses, hearing aids and dentures may not be worn into surgery.  Do not bring valuables to the hospital. [facility] is not responsible for any missing/lost belongings or valuables.   Notify your doctor if there is any change in your medical condition (cold, fever, infection).  Wear comfortable clothing (specific to your surgery type) to the hospital.  After surgery, you can help prevent lung complications by doing breathing exercises.  Take deep breaths and cough every 1-2 hours. Your doctor may order a device called an Incentive Spirometer to help you take deep breaths. When coughing or sneezing, hold a pillow firmly against your incision with both hands.  This is called "splinting." Doing this helps protect your incision. It also decreases belly discomfort.  If you are being admitted to the hospital overnight, leave your suitcase in the car. After surgery it may be brought to your room.  In case of increased patient census, it may be necessary for you, the patient, to continue your  postoperative care in the Same Day Surgery department.  If you are being discharged the day of surgery, you will not be allowed to drive home. You will need a responsible individual to drive you home and stay with you for 24 hours after surgery.   If you are taking public transportation, you will need to have a responsible individual with you.  Please call the Pre-admissions Testing Dept. at [phone] if you have any questions about these instructions.  Surgery Visitation Policy:  Patients having surgery or a procedure may have two visitors.  Children under the age of 59 must have an adult with them who is not the patient.

## 2022-07-11 ENCOUNTER — Encounter
Admission: RE | Admit: 2022-07-11 | Discharge: 2022-07-11 | Disposition: A | Discharge status: Home / Self Care | Payer: Medicare Other | Source: Ambulatory Visit | Attending: Pulmonary Disease | Admitting: Pulmonary Disease

## 2022-07-11 ENCOUNTER — Ambulatory Visit
Admission: RE | Admit: 2022-07-11 | Discharge: 2022-07-11 | Disposition: A | Discharge status: Home / Self Care | Payer: Medicare Other | Source: Ambulatory Visit | Attending: Pulmonary Disease | Admitting: Pulmonary Disease

## 2022-07-11 DIAGNOSIS — R0602 Shortness of breath: Secondary | ICD-10-CM | POA: Diagnosis not present

## 2022-07-11 DIAGNOSIS — J479 Bronchiectasis, uncomplicated: Secondary | ICD-10-CM | POA: Diagnosis not present

## 2022-07-11 DIAGNOSIS — R9431 Abnormal electrocardiogram [ECG] [EKG]: Secondary | ICD-10-CM | POA: Insufficient documentation

## 2022-07-11 DIAGNOSIS — R911 Solitary pulmonary nodule: Secondary | ICD-10-CM | POA: Diagnosis not present

## 2022-07-11 DIAGNOSIS — Z79899 Other long term (current) drug therapy: Secondary | ICD-10-CM

## 2022-07-11 DIAGNOSIS — R918 Other nonspecific abnormal finding of lung field: Secondary | ICD-10-CM | POA: Diagnosis not present

## 2022-07-11 DIAGNOSIS — Z01812 Encounter for preprocedural laboratory examination: Secondary | ICD-10-CM

## 2022-07-11 DIAGNOSIS — Z01818 Encounter for other preprocedural examination: Secondary | ICD-10-CM | POA: Insufficient documentation

## 2022-07-11 DIAGNOSIS — Z01810 Encounter for preprocedural cardiovascular examination: Secondary | ICD-10-CM

## 2022-07-11 DIAGNOSIS — I10 Essential (primary) hypertension: Secondary | ICD-10-CM | POA: Insufficient documentation

## 2022-07-11 LAB — BASIC METABOLIC PANEL
Anion gap: 11 (ref 5–15)
BUN: 16 mg/dL (ref 8–23)
CO2: 26 mmol/L (ref 22–32)
Calcium: 9.1 mg/dL (ref 8.9–10.3)
Chloride: 104 mmol/L (ref 98–111)
Creatinine, Ser: 0.79 mg/dL (ref 0.44–1.00)
GFR, Estimated: >60 mL/min (ref >60)
Glucose, Bld: 111 mg/dL — ABNORMAL HIGH (ref 70–99)
Potassium: 3.6 mmol/L (ref 3.5–5.1)
Sodium: 141 mmol/L (ref 135–145)

## 2022-07-11 LAB — CBC
HCT: 38.2 % (ref 36.0–46.0)
Hemoglobin: 12.5 g/dL (ref 12.0–15.0)
MCH: 29.9 pg (ref 26.0–34.0)
MCHC: 32.7 g/dL (ref 30.0–36.0)
MCV: 91.4 fL (ref 80.0–100.0)
Platelets: 258 10*3/uL (ref 150–400)
RBC: 4.18 MIL/uL (ref 3.87–5.11)
RDW: 13.2 % (ref 11.5–15.5)
WBC: 5.8 10*3/uL (ref 4.0–10.5)
nRBC: 0 % (ref 0.0–0.2)

## 2022-07-11 LAB — PROTIME-INR
INR: 1 (ref 0.8–1.2)
Prothrombin Time: 13.7 seconds (ref 11.4–15.2)

## 2022-07-11 LAB — APTT: aPTT: 29 seconds (ref 24–36)

## 2022-07-12 ENCOUNTER — Inpatient Hospital Stay: Admission: RE | Admit: 2022-07-12 | Payer: Medicare Other | Source: Ambulatory Visit

## 2022-07-14 ENCOUNTER — Ambulatory Visit: Payer: Medicare Other

## 2022-07-14 ENCOUNTER — Ambulatory Visit: Payer: Medicare Other | Admitting: Urgent Care

## 2022-07-14 ENCOUNTER — Other Ambulatory Visit: Payer: Self-pay

## 2022-07-14 ENCOUNTER — Ambulatory Visit: Payer: Medicare Other | Admitting: Anesthesiology

## 2022-07-14 ENCOUNTER — Ambulatory Visit
Admission: RE | Admit: 2022-07-14 | Discharge: 2022-07-14 | Disposition: A | Discharge status: Home / Self Care | Payer: Medicare Other | Attending: Pulmonary Disease | Admitting: Pulmonary Disease

## 2022-07-14 ENCOUNTER — Encounter
Admission: RE | Disposition: A | Discharge status: Home / Self Care | Payer: Self-pay | Source: Home / Self Care | Attending: Pulmonary Disease

## 2022-07-14 DIAGNOSIS — I10 Essential (primary) hypertension: Secondary | ICD-10-CM | POA: Insufficient documentation

## 2022-07-14 DIAGNOSIS — Z08 Encounter for follow-up examination after completed treatment for malignant neoplasm: Secondary | ICD-10-CM | POA: Insufficient documentation

## 2022-07-14 DIAGNOSIS — R911 Solitary pulmonary nodule: Secondary | ICD-10-CM | POA: Diagnosis not present

## 2022-07-14 DIAGNOSIS — J984 Other disorders of lung: Secondary | ICD-10-CM | POA: Insufficient documentation

## 2022-07-14 DIAGNOSIS — R918 Other nonspecific abnormal finding of lung field: Secondary | ICD-10-CM | POA: Diagnosis not present

## 2022-07-14 DIAGNOSIS — Z853 Personal history of malignant neoplasm of breast: Secondary | ICD-10-CM | POA: Insufficient documentation

## 2022-07-14 DIAGNOSIS — R599 Enlarged lymph nodes, unspecified: Secondary | ICD-10-CM | POA: Diagnosis not present

## 2022-07-14 DIAGNOSIS — E785 Hyperlipidemia, unspecified: Secondary | ICD-10-CM | POA: Diagnosis not present

## 2022-07-14 DIAGNOSIS — C884 Extranodal marginal zone B-cell lymphoma of mucosa-associated lymphoid tissue [MALT-lymphoma]: Secondary | ICD-10-CM | POA: Insufficient documentation

## 2022-07-14 DIAGNOSIS — M810 Age-related osteoporosis without current pathological fracture: Secondary | ICD-10-CM | POA: Insufficient documentation

## 2022-07-14 DIAGNOSIS — Z8572 Personal history of non-Hodgkin lymphomas: Secondary | ICD-10-CM | POA: Diagnosis not present

## 2022-07-14 HISTORY — PX: VIDEO BRONCHOSCOPY WITH ENDOBRONCHIAL ULTRASOUND: SHX6177

## 2022-07-14 LAB — CBC
HCT: 28.9 % — ABNORMAL LOW (ref 36.0–46.0)
Hemoglobin: 9.3 g/dL — ABNORMAL LOW (ref 12.0–15.0)
MCH: 29.7 pg (ref 26.0–34.0)
MCHC: 32.2 g/dL (ref 30.0–36.0)
MCV: 92.3 fL (ref 80.0–100.0)
Platelets: 184 10*3/uL (ref 150–400)
RBC: 3.13 MIL/uL — ABNORMAL LOW (ref 3.87–5.11)
RDW: 12.9 % (ref 11.5–15.5)
WBC: 3.5 10*3/uL — ABNORMAL LOW (ref 4.0–10.5)
nRBC: 0 % (ref 0.0–0.2)

## 2022-07-14 SURGERY — BRONCHOSCOPY, WITH EBUS
Anesthesia: General

## 2022-07-14 MED ORDER — CHLORHEXIDINE GLUCONATE 0.12 % MT SOLN
15.0000 mL | Freq: Once | OROMUCOSAL | Status: AC
  Administered 2022-07-14: 15 mL via OROMUCOSAL

## 2022-07-14 MED ORDER — ONDANSETRON HCL 4 MG/2ML IJ SOLN
INTRAMUSCULAR | Status: DC | PRN
  Administered 2022-07-14: 4 mg via INTRAVENOUS

## 2022-07-14 MED ORDER — FENTANYL CITRATE (PF) 100 MCG/2ML IJ SOLN
INTRAMUSCULAR | Status: AC
  Filled 2022-07-14: qty 2

## 2022-07-14 MED ORDER — FAMOTIDINE 20 MG PO TABS
ORAL_TABLET | ORAL | Status: AC
  Filled 2022-07-14: qty 1

## 2022-07-14 MED ORDER — DEXAMETHASONE SODIUM PHOSPHATE 10 MG/ML IJ SOLN
INTRAMUSCULAR | Status: DC | PRN
  Administered 2022-07-14: 5 mg via INTRAVENOUS

## 2022-07-14 MED ORDER — ROCURONIUM BROMIDE 100 MG/10ML IV SOLN
INTRAVENOUS | Status: DC | PRN
  Administered 2022-07-14: 50 mg via INTRAVENOUS

## 2022-07-14 MED ORDER — ONDANSETRON HCL 4 MG/2ML IJ SOLN: 4.0000 mg | Freq: Once | INTRAMUSCULAR | Status: DC | PRN

## 2022-07-14 MED ORDER — GLYCOPYRROLATE 0.2 MG/ML IJ SOLN
INTRAMUSCULAR | Status: DC | PRN
  Administered 2022-07-14: .1 mg via INTRAVENOUS

## 2022-07-14 MED ORDER — IPRATROPIUM-ALBUTEROL 0.5-2.5 (3) MG/3ML IN SOLN
RESPIRATORY_TRACT | Status: AC
  Filled 2022-07-14: qty 3

## 2022-07-14 MED ORDER — LACTATED RINGERS IV SOLN: INTRAVENOUS | Status: DC | PRN

## 2022-07-14 MED ORDER — LACTATED RINGERS IV SOLN: INTRAVENOUS | Status: DC

## 2022-07-14 MED ORDER — PHENYLEPHRINE HCL (PRESSORS) 10 MG/ML IV SOLN
INTRAVENOUS | Status: DC | PRN
  Administered 2022-07-14: 160 ug via INTRAVENOUS
  Administered 2022-07-14: 80 ug via INTRAVENOUS
  Administered 2022-07-14: 160 ug via INTRAVENOUS
  Administered 2022-07-14: 80 ug via INTRAVENOUS

## 2022-07-14 MED ORDER — IPRATROPIUM-ALBUTEROL 0.5-2.5 (3) MG/3ML IN SOLN
3.0000 mL | Freq: Once | RESPIRATORY_TRACT | Status: AC
  Administered 2022-07-14: 3 mL via RESPIRATORY_TRACT

## 2022-07-14 MED ORDER — SUGAMMADEX SODIUM 200 MG/2ML IV SOLN
INTRAVENOUS | Status: DC | PRN
  Administered 2022-07-14: 200 mg via INTRAVENOUS

## 2022-07-14 MED ORDER — PHENYLEPHRINE HCL-NACL 20-0.9 MG/250ML-% IV SOLN
INTRAVENOUS | Status: DC | PRN
  Administered 2022-07-14: 30 ug/min via INTRAVENOUS

## 2022-07-14 MED ORDER — ORAL CARE MOUTH RINSE: 15.0000 mL | Freq: Once | OROMUCOSAL | Status: AC

## 2022-07-14 MED ORDER — PHENYLEPHRINE HCL-NACL 20-0.9 MG/250ML-% IV SOLN
INTRAVENOUS | Status: AC
  Filled 2022-07-14: qty 250

## 2022-07-14 MED ORDER — LIDOCAINE HCL (PF) 2 % IJ SOLN
INTRAMUSCULAR | Status: AC
  Filled 2022-07-14: qty 5

## 2022-07-14 MED ORDER — FAMOTIDINE 20 MG PO TABS
20.0000 mg | ORAL_TABLET | Freq: Once | ORAL | Status: AC
  Administered 2022-07-14: 20 mg via ORAL

## 2022-07-14 MED ORDER — LIDOCAINE HCL (CARDIAC) PF 100 MG/5ML IV SOSY
PREFILLED_SYRINGE | INTRAVENOUS | Status: DC | PRN
  Administered 2022-07-14: 50 mg via INTRAVENOUS

## 2022-07-14 MED ORDER — PROPOFOL 10 MG/ML IV BOLUS
INTRAVENOUS | Status: AC
  Filled 2022-07-14: qty 40

## 2022-07-14 MED ORDER — PROPOFOL 10 MG/ML IV BOLUS
INTRAVENOUS | Status: DC | PRN
  Administered 2022-07-14: 100 mg via INTRAVENOUS
  Administered 2022-07-14: 120 ug/kg/min via INTRAVENOUS

## 2022-07-14 MED ORDER — FENTANYL CITRATE (PF) 100 MCG/2ML IJ SOLN
INTRAMUSCULAR | Status: DC | PRN
  Administered 2022-07-14: 50 ug via INTRAVENOUS
  Administered 2022-07-14 (×4): 25 ug via INTRAVENOUS
  Administered 2022-07-14: 50 ug via INTRAVENOUS

## 2022-07-14 MED ORDER — FENTANYL CITRATE (PF) 100 MCG/2ML IJ SOLN: 25.0000 ug | INTRAMUSCULAR | Status: DC | PRN

## 2022-07-14 MED ORDER — ROCURONIUM BROMIDE 10 MG/ML (PF) SYRINGE
PREFILLED_SYRINGE | INTRAVENOUS | Status: AC
  Filled 2022-07-14: qty 10

## 2022-07-14 MED ORDER — PROPOFOL 10 MG/ML IV BOLUS
INTRAVENOUS | Status: AC
  Filled 2022-07-14: qty 20

## 2022-07-14 MED ORDER — CHLORHEXIDINE GLUCONATE 0.12 % MT SOLN
OROMUCOSAL | Status: AC
  Filled 2022-07-14: qty 15

## 2022-07-14 NOTE — Anesthesia Preprocedure Evaluation (Signed)
Anesthesia Evaluation  Patient identified by MRN, date of birth, ID band Patient awake    Reviewed: Allergy & Precautions, NPO status , Patient's Chart, lab work & pertinent test results  Airway Mallampati: III  TM Distance: >3 FB Neck ROM: full    Dental  (+) Upper Dentures   Pulmonary neg pulmonary ROS   Pulmonary exam normal breath sounds clear to auscultation       Cardiovascular Exercise Tolerance: Good hypertension, Pt. 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 2 cm               axillary node. One of 11 nodes positive on post-adjuvant               chemotherapy axillary dissection. 3+ centimeter tumor. No date: Hyperlipemia No date: Hyperlipidemia No date: Hypertension 09/2013: Lymphedema of upper extremity following lymphadenectomy     Comment:  Left upper extremity. 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 Grade 3, ER, PR               negative, HER-2/neu 3+. 01/26/2016: COLONOSCOPY WITH PROPOFOL; N/A     Comment:  Procedure: COLONOSCOPY WITH PROPOFOL;  Surgeon: Earline Mayotte, MD;  Location: [facility] ENDOSCOPY;  Service:               Endoscopy;  Laterality: N/A; 02-25-14: MASTECTOMY; Left     Comment:  Dr Lemar Livings  BMI    Body Mass Index: 25.00 kg/m      Reproductive/Obstetrics negative OB ROS                              Anesthesia Physical Anesthesia Plan  ASA: 3  Anesthesia Plan: General   Post-op Pain Management:    Induction: Intravenous  PONV Risk Score and Plan: Ondansetron, Dexamethasone, Midazolam and Treatment may vary due to age or medical condition  Airway Management Planned: Oral ETT  Additional Equipment:   Intra-op Plan:   Post-operative Plan:   Informed Consent: I have reviewed the patients History and Physical, chart, labs and discussed the procedure including the risks, benefits and alternatives for the proposed anesthesia with the patient or authorized representative who has indicated his/her understanding and acceptance.     Dental Advisory Given  Plan Discussed with: CRNA and Surgeon  Anesthesia Plan Comments:          Anesthesia Quick Evaluation

## 2022-07-14 NOTE — Anesthesia Procedure Notes (Signed)
Procedure Name: Intubation Date/Time: 07/14/2022 12:51 PM  Performed by: Merlene Pulling, CRNAPre-anesthesia Checklist: Patient identified, Patient being monitored, Timeout performed, Emergency Drugs available and Suction available Patient Re-evaluated:Patient Re-evaluated prior to induction Oxygen Delivery Method: Circle system utilized Preoxygenation: Pre-oxygenation with 100% oxygen Induction Type: IV induction Ventilation: Mask ventilation without difficulty and Oral airway inserted - appropriate to patient size Laryngoscope Size: 3 and McGraph Grade View: Grade I Tube type: Oral Tube size: 8.5 (Per procedural team request) mm Number of attempts: 1 Airway Equipment and Method: Stylet Placement Confirmation: ETT inserted through vocal cords under direct vision, positive ETCO2 and breath sounds checked- equal and bilateral Secured at: 21 cm Tube secured with: Tape Dental Injury: Teeth and Oropharynx as per pre-operative assessment

## 2022-07-14 NOTE — Procedures (Addendum)
ROBOTIC NAVIGATIONAL BRONCHOSCOPY PROCEDURE NOTE  FIBEROPTIC BRONCHOSCOPY WITH BRONCHOALVEOLAR LAVAGE AND THERAPEUTIC ASPIRATION OF TRACHEOBRONCHIAL TREE PROCEDURE NOTE  ENDOBRONCHIAL ULTRASOUND PROCEDURE NOTE /> 3 LYMPH NODES    Flexible bronchoscopy was performed  by : Karna Christmas MD  assistance by : 1)Repiratory therapist  and 2)[employer] staff and 3) Anesthesia team and 4) Flouroscopy team and 5) [facility] STAFF   Indication for the procedure was :  Pre-procedural H&P. The following assessment was performed on the day of the procedure prior to initiating sedation History:  Chest pain n Dyspnea y Hemoptysis n Cough y Fever n Other pertinent items n  Examination Vital signs -reviewed as per nursing documentation today Cardiac    Murmurs: n  Rubs : n  Gallop: n Lungs Wheezing: n Rales : n Rhonchi :y  Other pertinent findings: SOB/hypoxemia due to chronic lung disease   Pre-procedural assessment for Procedural Sedation included: Depth of sedation: As per anesthesia team  ASA Classification:  2 Mallampati airway assessment: 3    Medication list reviewed: y  The patient's interval history was taken and revealed: no new complaints The pre- procedure physical examination revealed: No new findings Refer to prior clinic note for details.  Informed Consent: Informed consent was obtained from:  patient after explanation of procedure and risks, benefits, as well as alternative procedures available.  Explanation of level of sedation and possible transfusion was also provided.    Procedural Preparation: Time out was performed and patient was identified by name and birthdate and procedure to be performed and side for sampling, if any, was specified. Pt was intubated by anesthesia.  The patient was appropriately draped.   Fiberoptic bronchoscopy with airway inspection and BAL Procedure findings:  Bronchoscope was inserted via ETT  without difficulty.  Posterior  oropharynx, epiglottis, arytenoids, false cords and vocal cords were not visualized as these were bypassed by endotracheal tube. The distal trachea was normal in circumference and appearance without mucosal, cartilaginous or branching abnormalities.  The main carina was mildly splayed . All right and left lobar airways were visualized to the Subsegmental level.  Sub- sub segmental carinae were identified in all the distal airways.   Secretions were visible in the following airways and appeared to be clear.  The mucosa was : friable at RUL  Airways were notable for:        exophytic lesions :n       extrinsic compression in the following distributions: n.       Friable mucosa: y       Teacher, music /pigmentation: n   HEAVY MUCOPURULENT PHLEGM WAS IMPACTED IN RUL AND RML AND RLL.  THESE SEGMENTS AND SUBSEGMENTS WERE WASHED OUT AND IRRIGATED MULTIPLE TIMES.  MUCUS PLUGGING WAS EVACUATED VIA THERAPEUTIC ASPIRATION OF TRACHEOBRONCHIAL TREE.  BRONCHOALVEOLAR LAVAGE WAS PERFORMED AT RIGHT UPPER LOBE FOR MICROBIOLOGY AND CYTOLOGY.   Post procedure Diagnosis:   EDEMATOUS AND FRIABLE RIGHT UPPER LOBE WITH MUCUS PLUGGING OF RIGHT UPPER , MIDDLE AND LOWER LOBE     ROBOTIC Navigational Bronchoscopy Procedure Findings:    Post appropriate planning and registration peripheral navigation was used to visualize target lesion.    CYTOBRUSH X 2 - ENDOBRONCHIAL - RIGHT UPPER LOBE - LESIONAL CARCINOMA SURGICAL PATHOLOGY X 10 - ENDOBRONCHIAL RIGHT UPPER LOBE - LESIONAL CARCINOMA   Post procedure diagnosis: LUNG CANCER      Endobronchial ultrasound assisted hilar and mediastinal lymph node biopsies procedure findings: The fiberoptic bronchoscope was removed and the EBUS scope was introduced. Examination began  to evaluate for pathologically enlarged lymph nodes starting on the LEFT side progressing to the RIGHT side.  All lymph node biopsies performed with 21G needle. Lymph node biopsies were sent in  cytolite for all stations.  STATION  10L - 1.4CM - 4 PASSESS + RPMI STATION 7 - 1.6CM - 3 PASSESS +RPMI STATION 10R - 1.1CM - 3 PASSESS - RPMI  Post procedure diagnosis:  METASTATIC LYMPHADENOPATHY   Specimens obtained included:               Cytology brushes : RUL   Broncho-alveolar lavage site:RUL  sent for CYTOLOGY AND MICROBIOLOGY                             50ml volume infused 24 ml volume returned with CELLULAR AND SEROSANG appearance  Estimated Blood loss: EXPECTED <10 cc.  Complications included:  NONE IMMEDIATE   Preliminary CXR findings :  IN PROCESS   Disposition: HOME WITH DAUGHTER  Follow up with Dr. Karna Christmas in 5 days for result discussion.     Vida Rigger MD  [facility] Division of Pulmonary & Critical Care Medicine

## 2022-07-14 NOTE — Transfer of Care (Signed)
Immediate Anesthesia Transfer of Care Note  Patient: Andrea Rodgers  Procedure(s) Performed: VIDEO BRONCHOSCOPY WITH ENDOBRONCHIAL ULTRASOUND ROBOTIC ASSISTED NAVIGATIONAL BRONCHOSCOPY  Patient Location: PACU  Anesthesia Type:General  Level of Consciousness: drowsy  Airway & Oxygen Therapy: Patient Spontanous Breathing  Post-op Assessment: Report given to RN and Post -op Vital signs reviewed and stable  Post vital signs: Reviewed  Last Vitals:  Vitals Value Taken Time  BP 127/60 07/14/22 1433  Temp 35.8 C 07/14/22 1433  Pulse 71 07/14/22 1438  Resp 18 07/14/22 1438  SpO2 93 % 07/14/22 1438  Vitals shown include unvalidated device data.  Last Pain:  Vitals:   07/14/22 1433  TempSrc:   PainSc: 0-No pain         Complications: No notable events documented.

## 2022-07-14 NOTE — H&P (Signed)
PULMONOLOGY         Date: 07/14/2022,   MRN# [id number] Andrea Rodgers 1941/01/05     AdmissionWeight: 58.1 kg                 CurrentWeight: 58.1 kg  Referring provider: Dr Meredeth Ide    CHIEF COMPLAINT:   Right lung cavitary lesion    HISTORY OF PRESENT ILLNESS   This is a pleasant female with history of chronic anemia, breast cancer metastasized to axillary lymph nodes, dyslipidemia, essential hypertension, chronic lymphedema, previous history of lymphoma, osteoporosis came in with worsening cavitary lesion with progressive and extends from right upper to right lower lobes.  Patient was seen in clinic pulmonology office and discussion with evaluation of imaging.  Decision made for additional tissue sampling via bronchoscopy.  Plan today for robotic navigational bronchoscopy and endobronchial ultrasound assisted lymph node biopsies.  Additional questions answered patient wishes to proceed with planned surgery. Reviewed risks/complications and benefits with patient, risks include infection, pneumothorax/pneumomediastinum which may require chest tube placement as well as overnight/prolonged hospitalization and possible mechanical ventilation. Other risks include bleeding and very rarely death.  Patient understands risks and wishes to proceed.  Additional questions were answered, and patient is aware that post procedure patient will be going home with family and may experience cough with possible clots on expectoration as well as phlegm which may last few days as well as hoarseness of voice post intubation and mechanical ventilation.    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 2 cm axillary node. One of 11 nodes positive on post-adjuvant chemotherapy axillary dissection. 3+ centimeter tumor.   Hyperlipemia    Hyperlipidemia     Hypertension    Lymphedema of upper extremity following lymphadenectomy 09/2013   Left upper extremity.   MALT lymphoma (HCC)    Murmur    Osteoporosis    Personal history of chemotherapy      SURGICAL HISTORY   Past Surgical History:  Procedure Laterality Date   ABDOMINAL HYSTERECTOMY     APPENDECTOMY     BREAST SURGERY Left 02/25/14   Modified radical mastectomy, T2 N1. Grade 3, ER, PR negative, HER-2/neu 3+.   COLONOSCOPY WITH PROPOFOL N/A 01/26/2016   Procedure: COLONOSCOPY WITH PROPOFOL;  Surgeon: Earline Mayotte, MD;  Location: [facility] ENDOSCOPY;  Service: Endoscopy;  Laterality: N/A;   MASTECTOMY Left 02-25-14   Dr Lemar Livings     FAMILY HISTORY   Family History  Problem Relation Age of Onset   Hypertension Mother    Hypertension Maternal Aunt    Hypertension Maternal Uncle    Hypertension Maternal Aunt    Hypertension Maternal Aunt    Breast cancer Neg Hx      SOCIAL HISTORY   Social History   Tobacco Use   Smoking status: Never   Smokeless tobacco: Never  Vaping Use   Vaping Use: Never used  Substance Use Topics   Alcohol use: No   Drug use: No     MEDICATIONS    Home Medication:    Current Medication:  Current Facility-Administered Medications:    chlorhexidine (PERIDEX) 0.12 % solution 15 mL, 15 mL, Mouth/Throat, Once **OR** Oral care mouth rinse, 15 mL, Mouth Rinse, Once, Piscitello, Cleda Mccreedy, MD   famotidine (PEPCID) tablet 20 mg, 20 mg, Oral, Once, Wallace Cullens,  Cherre Huger, NP   lactated ringers infusion, , Intravenous, Continuous, Piscitello, Cleda Mccreedy, MD  Facility-Administered Medications Ordered in Other Encounters:    sodium chloride flush (NS) 0.9 % injection 10 mL, 10 mL, Intravenous, PRN, Louretta Shorten R, MD, 10 mL at 10/19/16 1054   sodium chloride flush (NS) 0.9 % injection 10 mL, 10 mL, Intravenous, PRN, Earna Coder, MD, 10 mL at 12/13/17 1050    ALLERGIES   Patient has no known allergies.     REVIEW OF SYSTEMS     Review of Systems:  Gen:  Denies  fever, sweats, chills weigh loss  HEENT: Denies blurred vision, double vision, ear pain, eye pain, hearing loss, nose bleeds, sore throat Cardiac:  No dizziness, chest pain or heaviness, chest tightness,edema Resp:   reports dyspnea chronically  Gi: Denies swallowing difficulty, stomach pain, nausea or vomiting, diarrhea, constipation, bowel incontinence Gu:  Denies bladder incontinence, burning urine Ext:   Denies Joint pain, stiffness or swelling Skin: Denies  skin rash, easy bruising or bleeding or hives Endoc:  Denies polyuria, polydipsia , polyphagia or weight change Psych:   Denies depression, insomnia or hallucinations   Other:  All other systems negative   VS: BP (!) 164/64   Pulse 78   Temp (!) 97.3 F (36.3 C) (Tympanic)   Resp 16   Ht 5' (1.524 m)   Wt 58.1 kg   SpO2 98%   BMI 25.00 kg/m      PHYSICAL EXAM    GENERAL:NAD, no fevers, chills, no weakness no fatigue HEAD: Normocephalic, atraumatic.  EYES: Pupils equal, round, reactive to light. Extraocular muscles intact. No scleral icterus.  MOUTH: Moist mucosal membrane. Dentition intact. No abscess noted.  EAR, NOSE, THROAT: Clear without exudates. No external lesions.  NECK: Supple. No thyromegaly. No nodules. No JVD.  PULMONARY: decreased breath sounds with mild rhonchi worse at bases bilaterally.  CARDIOVASCULAR: S1 and S2. Regular rate and rhythm. No murmurs, rubs, or gallops. No edema. Pedal pulses 2+ bilaterally.  GASTROINTESTINAL: Soft, nontender, nondistended. No masses. Positive bowel sounds. No hepatosplenomegaly.  MUSCULOSKELETAL: No swelling, clubbing, or edema. Range of motion full in all extremities.  NEUROLOGIC: Cranial nerves II through XII are intact. No gross focal neurological deficits. Sensation intact. Reflexes intact.  SKIN: No ulceration, lesions, rashes, or cyanosis. Skin warm and dry. Turgor intact.  PSYCHIATRIC: Mood, affect within normal limits.  The patient is awake, alert and oriented x 3. Insight, judgment intact.       IMAGING    CT chest -07/14/2022 reviewed in detail with lymphadenopathy and cavitary lung process of the right hemithorax  ASSESSMENT/PLAN   Cavitary right lung lesion with hilar lymphadenopathy -Suspicious for progression of previous lymphoma versus primary bronchogenic carcinoma versus indolent chronic infection.  Patient is here for tissue sampling via navigational bronchoscopy as well as lymph node staging via endobronchial ultrasound assisted lymph node biopsies. -Additional microbiology will be performed via bronchoalveolar lavage, cytology specimens will be sent also via bronchoalveolar lavage.  We also plan on doing microbiology and cytology brushings as well as endobronchial and transbronchial surgical biopsies for histology.  Additionally lymph node biopsies will be performed via Olympus 21-gauge needle sent for cytology to evaluate for lymphoma in RPMI solution as well as lung cancer and cytology.  -Reviewed risks/complications and benefits with patient, risks include infection, pneumothorax/pneumomediastinum which may require chest tube placement as well as overnight/prolonged hospitalization and possible mechanical ventilation. Other risks include bleeding and very rarely death.  Patient understands  risks and wishes to proceed.  Additional questions were answered, and patient is aware that post procedure patient will be going home with family and may experience cough with possible clots on expectoration as well as phlegm which may last few days as well as hoarseness of voice post intubation and mechanical ventilation.           Thank you for allowing me to participate in the care of this patient.   Patient/Family are satisfied with care plan and all questions have been answered.    Provider disclosure: Patient with at least one acute or chronic illness or injury that poses a threat to life or  bodily function and is being managed actively during this encounter.  All of the below services have been performed independently by signing provider:  review of prior documentation from internal and or external health records.  Review of previous and current lab results.  Interview and comprehensive assessment during patient visit today. Review of current and previous chest radiographs/CT scans. Discussion of management and test interpretation with health care team and patient/family.   This document was prepared using Dragon voice recognition software and may include unintentional dictation errors.     Vida Rigger, M.D.  Division of Pulmonary & Critical Care Medicine

## 2022-07-14 NOTE — Discharge Instructions (Signed)
AMBULATORY SURGERY  ?DISCHARGE INSTRUCTIONS ? ? ?The drugs that you were given will stay in your system until tomorrow so for the next 24 hours you should not: ? ?Drive an automobile ?Make any legal decisions ?Drink any alcoholic beverage ? ? ?You may resume regular meals tomorrow.  Today it is better to start with liquids and gradually work up to solid foods. ? ?You may eat anything you prefer, but it is better to start with liquids, then soup and crackers, and gradually work up to solid foods. ? ? ?Please notify your doctor immediately if you have any unusual bleeding, trouble breathing, redness and pain at the surgery site, drainage, fever, or pain not relieved by medication. ? ? ? ?Additional Instructions: ? ? ? ?Please contact your physician with any problems or Same Day Surgery at [phone], Monday through Friday 6 am to 4 pm, or [place] [facility] Main number at [phone].  ?

## 2022-07-16 LAB — CULTURE, BAL-QUANTITATIVE W GRAM STAIN: Culture: >=100000 — AB

## 2022-07-17 ENCOUNTER — Encounter: Payer: Self-pay | Admitting: Pulmonary Disease

## 2022-07-17 LAB — CULTURE, BAL-QUANTITATIVE W GRAM STAIN: Gram Stain: >=100000

## 2022-07-17 LAB — ASPERGILLUS ANTIGEN, BAL/SERUM: Aspergillus Ag, BAL/Serum: 0.2 Index (ref 0.00–0.49)

## 2022-07-17 LAB — ACID FAST SMEAR (AFB, MYCOBACTERIA): Acid Fast Smear: NEGATIVE

## 2022-07-17 LAB — CULTURE, FUNGUS WITHOUT SMEAR

## 2022-07-19 LAB — CULTURE, FUNGUS WITHOUT SMEAR

## 2022-07-20 ENCOUNTER — Telehealth: Payer: Self-pay | Admitting: Internal Medicine

## 2022-07-20 ENCOUNTER — Encounter: Payer: Self-pay | Admitting: Internal Medicine

## 2022-07-20 LAB — CYTOLOGY - NON PAP

## 2022-07-20 LAB — SURGICAL PATHOLOGY

## 2022-07-20 NOTE — Telephone Encounter (Signed)
A/S- please inform daughter that Pt needs to be seen sooner to discuss the results of the pathology.   BC- Please move up the MD appts/labs- next available/ 1-2 weeks- Thanks  GB

## 2022-07-20 NOTE — Progress Notes (Signed)
I spoke to patient regarding results of the biopsy positive for mild lymphoma/slow-growing cancer.  Discussed surveillance versus therapy- rituximab. discussed regarding a PET scan.   After the lengthy discussion daughter  will talk to her mother,   As she  is quite overwhelmed/  patient's husband is in hospice.  Daughter will let us know if pt  needs to be seen sooner.   Keep appointment as planned for now.     Wi ll discuss with  pulmonary.  GB

## 2022-07-20 NOTE — Telephone Encounter (Signed)
A/S- please inform daughter that Pt needs to be seen sooner to discuss the results of the pathology.   BC- Please move up the MD appts/labs- next available/ 1-2 weeks- Thanks  GB 

## 2022-07-21 DIAGNOSIS — A492 Hemophilus influenzae infection, unspecified site: Secondary | ICD-10-CM | POA: Diagnosis not present

## 2022-07-21 NOTE — Telephone Encounter (Signed)
Appt moved, labs in.

## 2022-07-27 NOTE — Anesthesia Postprocedure Evaluation (Signed)
Anesthesia Post Note  Patient: Andrea Rodgers  Procedure(s) Performed: VIDEO BRONCHOSCOPY WITH ENDOBRONCHIAL ULTRASOUND ROBOTIC ASSISTED NAVIGATIONAL BRONCHOSCOPY  Patient location during evaluation: PACU Anesthesia Type: General Level of consciousness: awake and awake and alert Pain management: pain level controlled Vital Signs Assessment: post-procedure vital signs reviewed and stable Respiratory status: spontaneous breathing Cardiovascular status: stable Anesthetic complications: no   No notable events documented.   Last Vitals:  Vitals:   07/14/22 1549 07/14/22 1557  BP: (!) 132/99 95/64  Pulse: 83 90  Resp: 18 18  Temp:  36.5 C  SpO2: 99% 92%    Last Pain:  Vitals:   07/14/22 1557  TempSrc: Temporal  PainSc: 0-No pain                 VAN STAVEREN,Azlyn Wingler

## 2022-08-02 ENCOUNTER — Encounter: Payer: Self-pay | Admitting: Internal Medicine

## 2022-08-02 ENCOUNTER — Inpatient Hospital Stay: Payer: Medicare Other

## 2022-08-02 ENCOUNTER — Inpatient Hospital Stay: Payer: Medicare Other | Attending: Internal Medicine | Admitting: Internal Medicine

## 2022-08-02 VITALS — BP 168/70 | HR 88 | Temp 97.6°F | Ht 60.0 in | Wt 125.6 lb

## 2022-08-02 DIAGNOSIS — C50812 Malignant neoplasm of overlapping sites of left female breast: Secondary | ICD-10-CM | POA: Diagnosis not present

## 2022-08-02 DIAGNOSIS — Z7982 Long term (current) use of aspirin: Secondary | ICD-10-CM | POA: Insufficient documentation

## 2022-08-02 DIAGNOSIS — Z79811 Long term (current) use of aromatase inhibitors: Secondary | ICD-10-CM | POA: Insufficient documentation

## 2022-08-02 DIAGNOSIS — Z171 Estrogen receptor negative status [ER-]: Secondary | ICD-10-CM | POA: Insufficient documentation

## 2022-08-02 DIAGNOSIS — Z79899 Other long term (current) drug therapy: Secondary | ICD-10-CM | POA: Diagnosis not present

## 2022-08-02 DIAGNOSIS — C884 Extranodal marginal zone B-cell lymphoma of mucosa-associated lymphoid tissue [MALT-lymphoma]: Secondary | ICD-10-CM | POA: Diagnosis not present

## 2022-08-02 DIAGNOSIS — E876 Hypokalemia: Secondary | ICD-10-CM | POA: Diagnosis not present

## 2022-08-02 DIAGNOSIS — M8580 Other specified disorders of bone density and structure, unspecified site: Secondary | ICD-10-CM | POA: Insufficient documentation

## 2022-08-02 DIAGNOSIS — I10 Essential (primary) hypertension: Secondary | ICD-10-CM | POA: Insufficient documentation

## 2022-08-02 LAB — CMP (CANCER CENTER ONLY)
ALT: 16 U/L (ref 0–44)
AST: 21 U/L (ref 15–41)
Albumin: 3.8 g/dL (ref 3.5–5.0)
Alkaline Phosphatase: 62 U/L (ref 38–126)
Anion gap: 7 (ref 5–15)
BUN: 11 mg/dL (ref 8–23)
CO2: 26 mmol/L (ref 22–32)
Calcium: 8.4 mg/dL — ABNORMAL LOW (ref 8.9–10.3)
Chloride: 104 mmol/L (ref 98–111)
Creatinine: 0.61 mg/dL (ref 0.44–1.00)
GFR, Estimated: >60 mL/min (ref >60)
Glucose, Bld: 104 mg/dL — ABNORMAL HIGH (ref 70–99)
Potassium: 3.5 mmol/L (ref 3.5–5.1)
Sodium: 137 mmol/L (ref 135–145)
Total Bilirubin: 0.6 mg/dL (ref 0.3–1.2)
Total Protein: 7.4 g/dL (ref 6.5–8.1)

## 2022-08-02 LAB — CBC WITH DIFFERENTIAL (CANCER CENTER ONLY)
Abs Immature Granulocytes: 0 10*3/uL (ref 0.00–0.07)
Basophils Absolute: 0 10*3/uL (ref 0.0–0.1)
Basophils Relative: 1 %
Eosinophils Absolute: 0 10*3/uL (ref 0.0–0.5)
Eosinophils Relative: 1 %
HCT: 34 % — ABNORMAL LOW (ref 36.0–46.0)
Hemoglobin: 11.2 g/dL — ABNORMAL LOW (ref 12.0–15.0)
Immature Granulocytes: 0 %
Lymphocytes Relative: 49 %
Lymphs Abs: 2 10*3/uL (ref 0.7–4.0)
MCH: 29.9 pg (ref 26.0–34.0)
MCHC: 32.9 g/dL (ref 30.0–36.0)
MCV: 90.9 fL (ref 80.0–100.0)
Monocytes Absolute: 0.7 10*3/uL (ref 0.1–1.0)
Monocytes Relative: 16 %
Neutro Abs: 1.3 10*3/uL — ABNORMAL LOW (ref 1.7–7.7)
Neutrophils Relative %: 33 %
Platelet Count: 272 10*3/uL (ref 150–400)
RBC: 3.74 MIL/uL — ABNORMAL LOW (ref 3.87–5.11)
RDW: 12.9 % (ref 11.5–15.5)
WBC Count: 4.1 10*3/uL (ref 4.0–10.5)
nRBC: 0 % (ref 0.0–0.2)

## 2022-08-02 LAB — CULTURE, FUNGUS WITHOUT SMEAR

## 2022-08-02 NOTE — Progress Notes (Signed)
No concerns today 

## 2022-08-02 NOTE — Progress Notes (Signed)
START ON PATHWAY REGIMEN - Lymphoma and CLL     A cycle is every 7 days:     Rituximab-xxxx   **Always confirm dose/schedule in your pharmacy ordering system**  Patient Characteristics: Marginal Zone Lymphoma, Localized, Other Disease Type: Marginal Zone Lymphoma Disease Type: Not Applicable Disease Type: Not Applicable Localized or Systemic Disease<= Localized Localized Disease Type: Other Intent of Therapy: Non-Curative / Palliative Intent, Discussed with Patient

## 2022-08-02 NOTE — Progress Notes (Signed)
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 STABLE band like opacity in RUL/RML MAY 2017 CT-NED;   June 2024- Dr.Aleskerov- A. LYMPH NODE, 10R; EBUS-ASSISTED FNA:  - POSITIVE FOR MALIGNANCY.  - CONSISTENT WITH INVOLVEMENT BY PATIENT'S KNOWN MALT / MARGINAL ZONE  LYMPHOMA.   Specimen is adequate for interpretation.   Comment:  Given the patient's history of MALT lymphoma CD20 and CD43 stains were  performed. Co-expression of CD20 and CD43 is present and supports the  above diagnosis.   # ZOX09UE 2016- MUGA 61 %; DEC 22nd 2016- 65%  # LEFT UE LYMPHEDEMA s/p PT ----------------------------------------------------    DIAGNOSIS: LEFT BREAST CA  STAGE:  II ;GOALS: curative  CURRENT/MOST RECENT THERAPY: arimidex    Mucosa-associated lymphoid tissue (MALT) lymphoma (HCC)  04/21/2015 Initial Diagnosis   Mucosa-associated lymphoid tissue (MALT) lymphoma (HCC)   08/03/2022 -  Chemotherapy   Patient is on Treatment Plan : NON-HODGKINS LYMPHOMA Rituximab q7d     Carcinoma of overlapping sites of left breast in female, estrogen receptor negative (HCC)  09/23/2015 Initial Diagnosis   Cancer of overlapping sites of left female breast (HCC)     INTERVAL HISTORY: Ambulating independently.  Accompanied by daughter.  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  Continues to be asymptomatic.  No worsening cough or shortness of breath.  Denies any nausea vomiting headache.  No fevers or chills.  No new lumps or bumps.  Review of Systems  Constitutional:  Positive for malaise/fatigue. Negative for chills, diaphoresis and fever.  HENT:  Negative for nosebleeds and sore throat.   Eyes:  Negative for double vision.  Respiratory:  Negative for cough, hemoptysis, sputum production, shortness of breath and wheezing.   Cardiovascular:  Negative for chest pain, palpitations, orthopnea and leg swelling.  Gastrointestinal:  Negative for abdominal pain, blood in stool, constipation, diarrhea, heartburn, melena, nausea and vomiting.  Genitourinary:  Negative for dysuria, frequency and urgency.  Musculoskeletal:  Positive for back pain and joint pain.  Skin: Negative.  Negative for itching and rash.  Neurological:  Negative for dizziness, tingling, focal weakness, weakness and headaches.  Endo/Heme/Allergies:  Does not bruise/bleed easily.  Psychiatric/Behavioral:  Negative for depression. The patient is not nervous/anxious and does not have insomnia.      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 2 cm axillary node. One of 11 nodes positive on post-adjuvant chemotherapy axillary dissection. 3+ centimeter tumor.   Hyperlipemia    Hyperlipidemia    Hypertension    Lymphedema of upper extremity  following lymphadenectomy 09/2013   Left upper extremity.   MALT lymphoma (HCC)    Murmur    Osteoporosis    Personal history of chemotherapy     PAST SURGICAL HISTORY :    Past Surgical History:  Procedure Laterality Date   ABDOMINAL HYSTERECTOMY     APPENDECTOMY     BREAST SURGERY Left 02/25/14   Modified radical mastectomy, T2 N1. Grade 3, ER, PR negative, HER-2/neu 3+.   COLONOSCOPY WITH PROPOFOL N/A 01/26/2016   Procedure: COLONOSCOPY WITH PROPOFOL;  Surgeon: Earline Mayotte, MD;  Location: [facility];  Service: Endoscopy;  Laterality: N/A;   MASTECTOMY Left 02-25-14   Dr Andrea Rodgers   VIDEO BRONCHOSCOPY WITH ENDOBRONCHIAL ULTRASOUND N/A 07/14/2022   Procedure: VIDEO BRONCHOSCOPY WITH ENDOBRONCHIAL ULTRASOUND;  Surgeon: Vida Rigger, MD;  Location: [facility] ORS;  Service: Thoracic;  Laterality: N/A;    FAMILY HISTORY :   Family History  Problem Relation Age of Onset   Hypertension Mother    Hypertension Maternal Aunt    Hypertension Maternal Uncle    Hypertension Maternal Aunt    Hypertension Maternal Aunt    Breast cancer Neg Hx     SOCIAL HISTORY:   Social History   Tobacco Use   Smoking status: Never   Smokeless tobacco: Never  Vaping Use   Vaping Use: Never used  Substance Use Topics   Alcohol use: No   Drug use: No    ALLERGIES:  has No Known Allergies.  MEDICATIONS:  Current Outpatient Medications  Medication Sig Dispense Refill   alendronate (FOSAMAX) 70 MG tablet Take 70 mg by mouth once a week. Take with a full glass of water on an empty stomach.  Wednesdays     amLODipine (NORVASC) 2.5 MG tablet Take 2.5 mg by mouth every morning.     anastrozole (ARIMIDEX) 1 MG tablet TAKE 1 TABLET BY MOUTH EVERY DAY (Patient taking differently: Take 1 mg by mouth every morning.) 90 tablet 3   aspirin 81 MG tablet Take 81 mg by mouth daily.     cholecalciferol (VITAMIN D) 1000 UNITS tablet Take 1,000 Units by mouth daily.     lidocaine-prilocaine (EMLA) cream Apply 1 application. topically as needed. Apply to port and cover with saran wrap 1-2 hours prior to port access 30 g 4   losartan-hydrochlorothiazide (HYZAAR) 100-25 MG per tablet  Take 1 tablet by mouth every morning.     Multiple Vitamins-Minerals (MULTIVITAMIN WITH MINERALS) tablet Take 1 tablet by mouth daily.     potassium chloride SA (KLOR-CON M) 20 MEQ tablet Take 20 mEq by mouth every morning.     simvastatin (ZOCOR) 40 MG tablet Take 40 mg by mouth every morning.     No current facility-administered medications for this visit.   Facility-Administered Medications Ordered in Other Visits  Medication Dose Route Frequency Provider Last Rate Last Admin   sodium chloride flush (NS) 0.9 % injection 10 mL  10 mL Intravenous PRN Andrea Coder, MD   10 mL at 10/19/16 1054   sodium chloride flush (NS) 0.9 % injection 10 mL  10 mL Intravenous PRN Andrea Coder, MD   10 mL at 12/13/17 1050    PHYSICAL EXAMINATION: ECOG PERFORMANCE STATUS: 0 - Asymptomatic  BP (!) 168/70 (BP Location: Right Arm, Patient Position: Sitting, Cuff Size: Normal)   Pulse 88   Temp 97.6 F (36.4 C) (Tympanic)   Ht 5' (1.524 m)   Wt 125 lb 9.6 oz (57 kg)  SpO2 98%   BMI 24.53 kg/m   Filed Weights   08/02/22 0845  Weight: 125 lb 9.6 oz (57 kg)     Physical Exam Constitutional:      Comments: Frail-appearing female patient.  She is walking herself.  Alone.  HENT:     Head: Normocephalic and atraumatic.     Mouth/Throat:     Pharynx: No oropharyngeal exudate.  Eyes:     Pupils: Pupils are equal, round, and reactive to light.  Cardiovascular:     Rate and Rhythm: Normal rate and regular rhythm.  Pulmonary:     Effort: No respiratory distress.     Breath sounds: No wheezing.  Abdominal:     General: Bowel sounds are normal. There is no distension.     Palpations: Abdomen is soft. There is no mass.     Tenderness: There is no abdominal tenderness. There is no guarding or rebound.  Musculoskeletal:        General: No tenderness. Normal range of motion.     Cervical back: Normal range of motion and neck supple.  Skin:    General: Skin is warm.  Neurological:      Mental Status: She is alert and oriented to person, place, and time.  Psychiatric:        Mood and Affect: Affect normal.      LABORATORY DATA:  I have reviewed the data as listed    Component Value Date/Time   NA 137 08/02/2022 0845   NA 136 05/27/2014 0942   K 3.5 08/02/2022 0845   K 3.4 (L) 05/27/2014 0942   CL 104 08/02/2022 0845   CL 102 05/27/2014 0942   CO2 26 08/02/2022 0845   CO2 29 05/27/2014 0942   GLUCOSE 104 (H) 08/02/2022 0845   GLUCOSE 106 (H) 05/27/2014 0942   BUN 11 08/02/2022 0845   BUN 13 05/27/2014 0942   CREATININE 0.61 08/02/2022 0845   CREATININE 0.58 05/27/2014 0942   CALCIUM 8.4 (L) 08/02/2022 0845   CALCIUM 8.6 (L) 05/27/2014 0942   PROT 7.4 08/02/2022 0845   PROT 6.8 05/27/2014 0942   ALBUMIN 3.8 08/02/2022 0845   ALBUMIN 3.8 05/27/2014 0942   AST 21 08/02/2022 0845   ALT 16 08/02/2022 0845   ALT 14 05/27/2014 0942   ALKPHOS 62 08/02/2022 0845   ALKPHOS 69 05/27/2014 0942   BILITOT 0.6 08/02/2022 0845   GFRNONAA >60 08/02/2022 0845   GFRNONAA >60 05/27/2014 0942   GFRAA >60 10/22/2019 1457   GFRAA >60 05/27/2014 0942    No results found for: "SPEP", "UPEP"  Lab Results  Component Value Date   WBC 4.1 08/02/2022   NEUTROABS 1.3 (L) 08/02/2022   HGB 11.2 (L) 08/02/2022   HCT 34.0 (L) 08/02/2022   MCV 90.9 08/02/2022   PLT 272 08/02/2022      Chemistry      Component Value Date/Time   NA 137 08/02/2022 0845   NA 136 05/27/2014 0942   K 3.5 08/02/2022 0845   K 3.4 (L) 05/27/2014 0942   CL 104 08/02/2022 0845   CL 102 05/27/2014 0942   CO2 26 08/02/2022 0845   CO2 29 05/27/2014 0942   BUN 11 08/02/2022 0845   BUN 13 05/27/2014 0942   CREATININE 0.61 08/02/2022 0845   CREATININE 0.58 05/27/2014 0942      Component Value Date/Time   CALCIUM 8.4 (L) 08/02/2022 0845   CALCIUM 8.6 (L) 05/27/2014 0942   ALKPHOS 62 08/02/2022 0845  ALKPHOS 69 05/27/2014 0942   AST 21 08/02/2022 0845   ALT 16 08/02/2022 0845   ALT 14  05/27/2014 0942   BILITOT 0.6 08/02/2022 0845       ASSESSMENT & PLAN:   Carcinoma of overlapping sites of left breast in female, estrogen receptor negative (HCC) # Right upper lobe/middle lobe- MALT [NO RT] - CT chest April 2024-  Chronic right upper lobe cavitary lesion with associated consolidations, bronchiectasis, and nodularity. Increased peribronchovascular consolidations and nodularity are seen in the posteromedial right lower lobe compared with most recent prior. S/p evaluation with Dr.A- and Bronch- - POSITIVE FOR MALIGNANCY; CONSISTENT WITH INVOLVEMENT BY PATIENT'S KNOWN MALT / MARGINAL ZONE LYMPHOMA.   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  Recommended PET scan for further evaluation.  Recommend consideration of rituximab. #  Discussed mechanism of action of rituximab;and also potential side effects including but not limited to infusion reactions;rare infections/activation including hepatitis.  Check hepatitis work-up at baseline. Ordered; not scheduled.   # # 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 On anastrazole. CT scan- JAN 2023-stable/0.7 cm subpectoral lymph node; but PET JAN 2023- NED; see below.   Continue anastrozole for now. Stable.    # HTN- poorly controlled in clinic- systolic BP-160s; at home 120s.   Stable-  # Hypokalemia- mild- recommend compliance with Kdur.    # Intermittently Elevated tumor marker CA 27-29-OCT 2023- 49; Again no clear trend noted [facility] 2023- 49]; await repeat tumor marker testing. . Monitor for now. Stable-  # OSTEOPENIA- JULy 2022-T-score of -1.5; continue Fosamax with vitamin D.' Stable-  # port flush every 3 months; keep for now.   # Vaccination: s/p Pneumonia vaccination-   # DISPOSITION: hepatitis- ordered ## follow up in 5 weeks- MD/ labs-cbc/cmp/port flush;  prior-PET scan-  Dr.B.   # I reviewed the blood work-  with the patient in detail; also reviewed the imaging independently [as summarized above]; and with the patient in detail.   # 40 minutes face-to-face with the patient discussing the above plan of care; more than 50% of time spent on prognosis/ natural history; counseling and coordination.   Cc;Dr.Tate.      Andrea Coder, MD 08/02/2022 4:30 PM

## 2022-08-02 NOTE — Assessment & Plan Note (Addendum)
#   Right upper lobe/middle lobe- MALT [NO RT] - CT chest April 2024-  Chronic right upper lobe cavitary lesion with associated consolidations, bronchiectasis, and nodularity. Increased peribronchovascular consolidations and nodularity are seen in the posteromedial right lower lobe compared with most recent prior. S/p evaluation with Dr.A- and Bronch- - POSITIVE FOR MALIGNANCY; CONSISTENT WITH INVOLVEMENT BY PATIENT'S KNOWN MALT / MARGINAL ZONE LYMPHOMA.   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  Recommended PET scan for further evaluation.  Recommend consideration of rituximab. #  Discussed mechanism of action of rituximab;and also potential side effects including but not limited to infusion reactions;rare infections/activation including hepatitis.  Check hepatitis work-up at baseline. Ordered; not scheduled.   # # 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 On anastrazole. CT scan- JAN 2023-stable/0.7 cm subpectoral lymph node; but PET JAN 2023- NED; see below.   Continue anastrozole for now. Stable.    # HTN- poorly controlled in clinic- systolic BP-160s; at home 120s.   Stable-  # Hypokalemia- mild- recommend compliance with Kdur.    # Intermittently Elevated tumor marker CA 27-29-OCT 2023- 49; Again no clear trend noted [facility] 2023- 49]; await repeat tumor marker testing. . Monitor for now. Stable-  # OSTEOPENIA- JULy 2022-T-score of -1.5; continue Fosamax with vitamin D.' Stable-  # port flush every 3 months; keep for now.   # Vaccination: s/p Pneumonia vaccination-   # DISPOSITION: hepatitis- ordered ## follow up in 5 weeks- MD/ labs-cbc/cmp/port flush;  prior-PET scan-  Dr.B.   # I reviewed the blood work- with the patient in detail; also reviewed the imaging independently [as summarized above]; and with the patient in detail.   # 40 minutes face-to-face with the patient  discussing the above plan of care; more than 50% of time spent on prognosis/ natural history; counseling and coordination.   Cc;Dr.Tate.

## 2022-08-03 ENCOUNTER — Other Ambulatory Visit: Payer: Self-pay

## 2022-08-03 LAB — CANCER ANTIGEN 27.29: CA 27.29: 52.5 U/mL — ABNORMAL HIGH (ref 0.0–38.6)

## 2022-08-04 LAB — CULTURE, FUNGUS WITHOUT SMEAR

## 2022-08-05 ENCOUNTER — Other Ambulatory Visit: Payer: Self-pay

## 2022-08-07 ENCOUNTER — Encounter: Payer: Self-pay | Admitting: Pulmonary Disease

## 2022-08-28 LAB — ACID FAST CULTURE WITH REFLEXED SENSITIVITIES (MYCOBACTERIA): Acid Fast Culture: NEGATIVE

## 2022-08-31 ENCOUNTER — Ambulatory Visit
Admission: RE | Admit: 2022-08-31 | Discharge: 2022-08-31 | Disposition: A | Discharge status: Home / Self Care | Payer: Medicare Other | Source: Ambulatory Visit | Attending: Internal Medicine | Admitting: Internal Medicine

## 2022-08-31 DIAGNOSIS — Z853 Personal history of malignant neoplasm of breast: Secondary | ICD-10-CM | POA: Insufficient documentation

## 2022-08-31 DIAGNOSIS — I700 Atherosclerosis of aorta: Secondary | ICD-10-CM | POA: Insufficient documentation

## 2022-08-31 DIAGNOSIS — R918 Other nonspecific abnormal finding of lung field: Secondary | ICD-10-CM | POA: Diagnosis not present

## 2022-08-31 DIAGNOSIS — C884 Extranodal marginal zone B-cell lymphoma of mucosa-associated lymphoid tissue [MALT-lymphoma]: Secondary | ICD-10-CM | POA: Insufficient documentation

## 2022-08-31 DIAGNOSIS — Z9012 Acquired absence of left breast and nipple: Secondary | ICD-10-CM | POA: Insufficient documentation

## 2022-08-31 DIAGNOSIS — N8110 Cystocele, unspecified: Secondary | ICD-10-CM | POA: Diagnosis not present

## 2022-08-31 DIAGNOSIS — K5730 Diverticulosis of large intestine without perforation or abscess without bleeding: Secondary | ICD-10-CM | POA: Insufficient documentation

## 2022-08-31 DIAGNOSIS — J479 Bronchiectasis, uncomplicated: Secondary | ICD-10-CM | POA: Diagnosis not present

## 2022-08-31 DIAGNOSIS — I2510 Atherosclerotic heart disease of native coronary artery without angina pectoris: Secondary | ICD-10-CM | POA: Insufficient documentation

## 2022-08-31 LAB — GLUCOSE, CAPILLARY: Glucose-Capillary: 81 mg/dL (ref 70–99)

## 2022-08-31 MED ORDER — FLUDEOXYGLUCOSE F - 18 (FDG) INJECTION
6.5000 | Freq: Once | INTRAVENOUS | Status: AC | PRN
  Administered 2022-08-31: 6.82 via INTRAVENOUS

## 2022-09-05 ENCOUNTER — Other Ambulatory Visit: Payer: Self-pay | Admitting: *Deleted

## 2022-09-05 DIAGNOSIS — C884 Extranodal marginal zone B-cell lymphoma of mucosa-associated lymphoid tissue [MALT-lymphoma]: Secondary | ICD-10-CM

## 2022-09-05 DIAGNOSIS — Z171 Estrogen receptor negative status [ER-]: Secondary | ICD-10-CM

## 2022-09-06 ENCOUNTER — Inpatient Hospital Stay: Payer: Medicare Other | Attending: Internal Medicine

## 2022-09-06 ENCOUNTER — Inpatient Hospital Stay (HOSPITAL_BASED_OUTPATIENT_CLINIC_OR_DEPARTMENT_OTHER): Payer: Medicare Other | Admitting: Internal Medicine

## 2022-09-06 VITALS — BP 156/73 | HR 82 | Temp 97.8°F | Wt 125.6 lb

## 2022-09-06 DIAGNOSIS — C884 Extranodal marginal zone B-cell lymphoma of mucosa-associated lymphoid tissue [MALT-lymphoma]: Secondary | ICD-10-CM

## 2022-09-06 DIAGNOSIS — Z7982 Long term (current) use of aspirin: Secondary | ICD-10-CM | POA: Insufficient documentation

## 2022-09-06 DIAGNOSIS — Z79899 Other long term (current) drug therapy: Secondary | ICD-10-CM | POA: Diagnosis not present

## 2022-09-06 DIAGNOSIS — C50812 Malignant neoplasm of overlapping sites of left female breast: Secondary | ICD-10-CM | POA: Insufficient documentation

## 2022-09-06 DIAGNOSIS — Z171 Estrogen receptor negative status [ER-]: Secondary | ICD-10-CM | POA: Diagnosis not present

## 2022-09-06 DIAGNOSIS — Z95828 Presence of other vascular implants and grafts: Secondary | ICD-10-CM

## 2022-09-06 DIAGNOSIS — Z79811 Long term (current) use of aromatase inhibitors: Secondary | ICD-10-CM | POA: Insufficient documentation

## 2022-09-06 LAB — CMP (CANCER CENTER ONLY)
ALT: 16 U/L (ref 0–44)
AST: 21 U/L (ref 15–41)
Albumin: 4.1 g/dL (ref 3.5–5.0)
Alkaline Phosphatase: 72 U/L (ref 38–126)
Anion gap: 8 (ref 5–15)
BUN: 9 mg/dL (ref 8–23)
CO2: 27 mmol/L (ref 22–32)
Calcium: 9 mg/dL (ref 8.9–10.3)
Chloride: 104 mmol/L (ref 98–111)
Creatinine: 0.63 mg/dL (ref 0.44–1.00)
GFR, Estimated: >60 mL/min (ref >60)
Glucose, Bld: 107 mg/dL — ABNORMAL HIGH (ref 70–99)
Potassium: 3.5 mmol/L (ref 3.5–5.1)
Sodium: 139 mmol/L (ref 135–145)
Total Bilirubin: 0.4 mg/dL (ref 0.3–1.2)
Total Protein: 7.5 g/dL (ref 6.5–8.1)

## 2022-09-06 LAB — CBC WITH DIFFERENTIAL (CANCER CENTER ONLY)
Abs Immature Granulocytes: 0 10*3/uL (ref 0.00–0.07)
Basophils Absolute: 0 10*3/uL (ref 0.0–0.1)
Basophils Relative: 0 %
Eosinophils Absolute: 0.1 10*3/uL (ref 0.0–0.5)
Eosinophils Relative: 1 %
HCT: 36.1 % (ref 36.0–46.0)
Hemoglobin: 11.8 g/dL — ABNORMAL LOW (ref 12.0–15.0)
Immature Granulocytes: 0 %
Lymphocytes Relative: 41 %
Lymphs Abs: 2 10*3/uL (ref 0.7–4.0)
MCH: 30 pg (ref 26.0–34.0)
MCHC: 32.7 g/dL (ref 30.0–36.0)
MCV: 91.9 fL (ref 80.0–100.0)
Monocytes Absolute: 0.8 10*3/uL (ref 0.1–1.0)
Monocytes Relative: 17 %
Neutro Abs: 2 10*3/uL (ref 1.7–7.7)
Neutrophils Relative %: 41 %
Platelet Count: 228 10*3/uL (ref 150–400)
RBC: 3.93 MIL/uL (ref 3.87–5.11)
RDW: 13.2 % (ref 11.5–15.5)
WBC Count: 4.8 10*3/uL (ref 4.0–10.5)
nRBC: 0 % (ref 0.0–0.2)

## 2022-09-06 LAB — LACTATE DEHYDROGENASE: LDH: 151 U/L (ref 98–192)

## 2022-09-06 LAB — HEPATITIS C ANTIBODY: HCV Ab: NONREACTIVE

## 2022-09-06 LAB — HEPATITIS B CORE ANTIBODY, IGM: Hep B C IgM: NONREACTIVE

## 2022-09-06 LAB — HEPATITIS B SURFACE ANTIGEN: Hepatitis B Surface Ag: NONREACTIVE

## 2022-09-06 MED ORDER — SODIUM CHLORIDE 0.9% FLUSH
10.0000 mL | INTRAVENOUS | Status: DC | PRN
  Administered 2022-09-06: 10 mL via INTRAVENOUS
  Filled 2022-09-06: qty 10

## 2022-09-06 MED ORDER — HEPARIN SOD (PORK) LOCK FLUSH 100 UNIT/ML IV SOLN
500.0000 [IU] | Freq: Once | INTRAVENOUS | Status: AC
  Administered 2022-09-06: 500 [IU] via INTRAVENOUS
  Filled 2022-09-06: qty 5

## 2022-09-06 NOTE — Progress Notes (Signed)
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 STABLE band like opacity in RUL/RML MAY 2017 CT-NED;   June 2024- Dr.Aleskerov- A. LYMPH NODE, 10R; EBUS-ASSISTED FNA:  - POSITIVE FOR MALIGNANCY.  - CONSISTENT WITH INVOLVEMENT BY PATIENT'S KNOWN MALT / MARGINAL ZONE  LYMPHOMA.   Specimen is adequate for interpretation.   Comment:  Given the patient's history of MALT lymphoma CD20 and CD43 stains were  performed. Co-expression of CD20 and CD43 is present and supports the  above diagnosis.   # [id number] 2016- MUGA 61 %; DEC 22nd 2016- 65%  # LEFT UE LYMPHEDEMA s/p PT ----------------------------------------------------    DIAGNOSIS: LEFT BREAST CA  STAGE:  II ;GOALS: curative  CURRENT/MOST RECENT THERAPY: arimidex    Mucosa-associated lymphoid tissue (MALT) lymphoma (HCC)  04/21/2015 Initial Diagnosis   Mucosa-associated lymphoid tissue (MALT) lymphoma (HCC)   09/06/2022 Cancer Staging   Staging form: Hodgkin and Non-Hodgkin Lymphoma, AJCC 7th Edition - Clinical: Stage I (E - Extranodal, A - Asymptomatic) - Signed by Earna Coder, MD on 09/06/2022 Source of metastatic specimen: Lung Immune suppression conditions: None   09/19/2022 -  Chemotherapy   Patient is on Treatment Plan : NON-HODGKINS LYMPHOMA  Rituximab q7d     Carcinoma of overlapping sites of left breast in female, estrogen receptor negative (HCC)  09/23/2015 Initial Diagnosis   Cancer of overlapping sites of left female breast (HCC)     INTERVAL HISTORY: Ambulating independently.  Accompanied by daughter.  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   Continues to be asymptomatic.  No worsening cough or shortness of breath.  Denies any nausea vomiting headache.  No fevers or chills.  No new lumps or bumps.  Review of Systems  Constitutional:  Positive for malaise/fatigue. Negative for chills, diaphoresis and fever.  HENT:  Negative for nosebleeds and sore throat.   Eyes:  Negative for double vision.  Respiratory:  Negative for cough, hemoptysis, sputum production, shortness of breath and wheezing.   Cardiovascular:  Negative for chest pain, palpitations, orthopnea and leg swelling.  Gastrointestinal:  Negative for abdominal pain, blood in stool, constipation, diarrhea, heartburn, melena, nausea and vomiting.  Genitourinary:  Negative for dysuria, frequency and urgency.  Musculoskeletal:  Positive for back pain and joint pain.  Skin: Negative.  Negative for itching and rash.  Neurological:  Negative for dizziness, tingling, focal weakness, weakness and headaches.  Endo/Heme/Allergies:  Does not bruise/bleed easily.  Psychiatric/Behavioral:  Negative for depression. The patient is not nervous/anxious and does not have insomnia.      PAST MEDICAL HISTORY :  Past Medical History:  Diagnosis Date   Anemia    Breast cancer  metastasized to axillary lymph node (HCC) 09/2013   T2, N1, ER negative, PR negative, HER-2 amplified. 2 cm axillary node. One of 11 nodes positive on post-adjuvant chemotherapy axillary  dissection. 3+ centimeter tumor.   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   MALT lymphoma (HCC)    Murmur    Osteoporosis    Personal history of chemotherapy     PAST SURGICAL HISTORY :   Past Surgical History:  Procedure Laterality Date   ABDOMINAL HYSTERECTOMY     APPENDECTOMY     BREAST SURGERY Left 02/25/14   Modified radical mastectomy, T2 N1. Grade 3, ER, PR negative, HER-2/neu 3+.   COLONOSCOPY WITH PROPOFOL N/A 01/26/2016   Procedure: COLONOSCOPY WITH PROPOFOL;  Surgeon: Earline Mayotte, MD;  Location: [facility] ENDOSCOPY;  Service: Endoscopy;  Laterality: N/A;   MASTECTOMY Left 02-25-14   Dr Lemar Livings   VIDEO BRONCHOSCOPY WITH ENDOBRONCHIAL ULTRASOUND N/A 07/14/2022   Procedure: VIDEO BRONCHOSCOPY WITH ENDOBRONCHIAL ULTRASOUND;  Surgeon: Vida Rigger, MD;  Location: [facility] ORS;  Service: Thoracic;  Laterality: N/A;    FAMILY HISTORY :   Family History  Problem Relation Age of Onset   Hypertension Mother    Hypertension Maternal Aunt    Hypertension Maternal Uncle    Hypertension Maternal Aunt    Hypertension Maternal Aunt    Breast cancer Neg Hx     SOCIAL HISTORY:   Social History   Tobacco Use   Smoking status: Never   Smokeless tobacco: Never  Vaping Use   Vaping status: Never Used  Substance Use Topics   Alcohol use: No   Drug use: No    ALLERGIES:  has No Known Allergies.  MEDICATIONS:  Current Outpatient Medications  Medication Sig Dispense Refill   alendronate (FOSAMAX) 70 MG tablet Take 70 mg by mouth once a week. Take with a full glass of water on an empty stomach.  Wednesdays     amLODipine (NORVASC) 2.5 MG tablet Take 2.5 mg by mouth every morning.     anastrozole (ARIMIDEX) 1 MG tablet TAKE 1 TABLET BY MOUTH EVERY DAY (Patient taking differently: Take 1 mg by mouth every morning.) 90 tablet 3   aspirin 81 MG tablet Take 81 mg by mouth daily.      cholecalciferol (VITAMIN D) 1000 UNITS tablet Take 1,000 Units by mouth daily.     lidocaine-prilocaine (EMLA) cream Apply 1 application. topically as needed. Apply to port and cover with saran wrap 1-2 hours prior to port access 30 g 4   losartan-hydrochlorothiazide (HYZAAR) 100-25 MG per tablet Take 1 tablet by mouth every morning.     Multiple Vitamins-Minerals (MULTIVITAMIN WITH MINERALS) tablet Take 1 tablet by mouth daily.     potassium chloride SA (KLOR-CON M) 20 MEQ tablet Take 20 mEq by mouth every morning.     simvastatin (ZOCOR) 40 MG tablet Take 40 mg by mouth every morning.     No current facility-administered medications for this visit.   Facility-Administered Medications Ordered in Other Visits  Medication Dose Route Frequency Provider Last Rate Last Admin   sodium chloride flush (NS) 0.9 % injection 10 mL  10 mL Intravenous PRN Louretta Shorten R, MD   10 mL at 10/19/16 1054   sodium chloride flush (NS) 0.9 % injection 10 mL  10 mL Intravenous PRN Earna Coder, MD   10 mL at 12/13/17 1050  sodium chloride flush (NS) 0.9 % injection 10 mL  10 mL Intravenous PRN Earna Coder, MD   10 mL at 09/06/22 1026    PHYSICAL EXAMINATION: ECOG PERFORMANCE STATUS: 0 - Asymptomatic  BP (!) 156/73 (BP Location: Left Arm, Patient Position: Sitting, Cuff Size: Normal)   Pulse 82   Temp 97.8 F (36.6 C) (Tympanic)   Wt 125 lb 9.6 oz (57 kg)   SpO2 98%   BMI 24.53 kg/m   Filed Weights   09/06/22 1037  Weight: 125 lb 9.6 oz (57 kg)      Physical Exam Constitutional:      Comments: Frail-appearing female patient.  She is walking herself.  Alone.  HENT:     Head: Normocephalic and atraumatic.     Mouth/Throat:     Pharynx: No oropharyngeal exudate.  Eyes:     Pupils: Pupils are equal, round, and reactive to light.  Cardiovascular:     Rate and Rhythm: Normal rate and regular rhythm.  Pulmonary:     Effort: No respiratory distress.     Breath sounds: No  wheezing.  Abdominal:     General: Bowel sounds are normal. There is no distension.     Palpations: Abdomen is soft. There is no mass.     Tenderness: There is no abdominal tenderness. There is no guarding or rebound.  Musculoskeletal:        General: No tenderness. Normal range of motion.     Cervical back: Normal range of motion and neck supple.  Skin:    General: Skin is warm.  Neurological:     Mental Status: She is alert and oriented to person, place, and time.  Psychiatric:        Mood and Affect: Affect normal.      LABORATORY DATA:  I have reviewed the data as listed    Component Value Date/Time   NA 139 09/06/2022 1021   NA 136 05/27/2014 0942   K 3.5 09/06/2022 1021   K 3.4 (L) 05/27/2014 0942   CL 104 09/06/2022 1021   CL 102 05/27/2014 0942   CO2 27 09/06/2022 1021   CO2 29 05/27/2014 0942   GLUCOSE 107 (H) 09/06/2022 1021   GLUCOSE 106 (H) 05/27/2014 0942   BUN 9 09/06/2022 1021   BUN 13 05/27/2014 0942   CREATININE 0.63 09/06/2022 1021   CREATININE 0.58 05/27/2014 0942   CALCIUM 9.0 09/06/2022 1021   CALCIUM 8.6 (L) 05/27/2014 0942   PROT 7.5 09/06/2022 1021   PROT 6.8 05/27/2014 0942   ALBUMIN 4.1 09/06/2022 1021   ALBUMIN 3.8 05/27/2014 0942   AST 21 09/06/2022 1021   ALT 16 09/06/2022 1021   ALT 14 05/27/2014 0942   ALKPHOS 72 09/06/2022 1021   ALKPHOS 69 05/27/2014 0942   BILITOT 0.4 09/06/2022 1021   GFRNONAA >60 09/06/2022 1021   GFRNONAA >60 05/27/2014 0942   GFRAA >60 10/22/2019 1457   GFRAA >60 05/27/2014 0942    No results found for: "SPEP", "UPEP"  Lab Results  Component Value Date   WBC 4.8 09/06/2022   NEUTROABS 2.0 09/06/2022   HGB 11.8 (L) 09/06/2022   HCT 36.1 09/06/2022   MCV 91.9 09/06/2022   PLT 228 09/06/2022      Chemistry      Component Value Date/Time   NA 139 09/06/2022 1021   NA 136 05/27/2014 0942   K 3.5 09/06/2022 1021   K 3.4 (L) 05/27/2014 0942   CL 104 09/06/2022  1021   CL 102 05/27/2014 0942   CO2  27 09/06/2022 1021   CO2 29 05/27/2014 0942   BUN 9 09/06/2022 1021   BUN 13 05/27/2014 0942   CREATININE 0.63 09/06/2022 1021   CREATININE 0.58 05/27/2014 0942      Component Value Date/Time   CALCIUM 9.0 09/06/2022 1021   CALCIUM 8.6 (L) 05/27/2014 0942   ALKPHOS 72 09/06/2022 1021   ALKPHOS 69 05/27/2014 0942   AST 21 09/06/2022 1021   ALT 16 09/06/2022 1021   ALT 14 05/27/2014 0942   BILITOT 0.4 09/06/2022 1021       ASSESSMENT & PLAN:   Mucosa-associated lymphoid tissue (MALT) lymphoma (HCC) ## Right upper lobe/middle lobe- MALT [NO RT] - CT chest April 2024-  Chronic right upper lobe cavitary lesion with associated consolidations, bronchiectasis, and nodularity. Increased peribronchovascular consolidations and nodularity are seen in the posteromedial right lower lobe compared with most recent prior. S/p evaluation with Dr.A- and Bronch- MAY 31st, 2024-- POSITIVE FOR MALIGNANCY; CONSISTENT WITH INVOLVEMENT BY PATIENT'S KNOWN MALT / MARGINAL ZONE LYMPHOMA.  JULY 17th- PET- pending.  # Recommend rituximab weekly x 4.  Discussed the goal of treatment is not curative but to control disease.  Also discussed that low-grade lymphoma usually not curable; and in general have indolent clinical course.  Will plan to start treatment in 2 weeks.  #  Discussed mechanism of action of rituximab;and also potential side effects including but not limited to infusion reactions;rare infections/activation including hepatitis.  Check hepatitis work-up at baseline.    # 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 Clinically no evidence of recurrence. Continue arimidex for now.   # Elevated tumor marker CA 27-29-  Jan 2018- slightly up- Normal. Results are pending from today. Plan to re-check in 6 months.   # Osteoporosis surveillance-she is on fosomax/ vit D;  bone density May 2017 osteopenia.    # port flush every 2 months-    # follow up in 6 months/ labs- ca 27-29/port flush.   # DISPOSITION: # follow up in 2 weeks- MD;port/ labs- cbc/cmp; Rituximab # follow up in 3 weeks- labs-port/ cbc/bmp; rituximab- Dr.B   # 40 minutes face-to-face with the patient discussing the above plan of care; more than 50% of time spent on prognosis/ natural history; counseling and coordination.       Earna Coder, MD 09/06/2022 12:16 PM

## 2022-09-06 NOTE — Progress Notes (Signed)
Patient says that she is doing about the same there isn't any new symptoms to report today.

## 2022-09-06 NOTE — Assessment & Plan Note (Deleted)
#   Right upper lobe/middle lobe- MALT [NO RT] - CT chest April 2024-  Chronic right upper lobe cavitary lesion with associated consolidations, bronchiectasis, and nodularity. Increased peribronchovascular consolidations and nodularity are seen in the posteromedial right lower lobe compared with most recent prior. S/p evaluation with Dr.A- and Bronch- - POSITIVE FOR MALIGNANCY; CONSISTENT WITH INVOLVEMENT BY PATIENT'S KNOWN MALT / MARGINAL ZONE LYMPHOMA.   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  Recommended PET scan for further evaluation.  Recommend consideration of rituximab. #  Discussed mechanism of action of rituximab;and also potential side effects including but not limited to infusion reactions;rare infections/activation including hepatitis.  Check hepatitis work-up at baseline. Ordered; not scheduled.   # # 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 On anastrazole. CT scan- JAN 2023-stable/0.7 cm subpectoral lymph node; but PET JAN 2023- NED; see below.   Continue anastrozole for now. Stable.    # HTN- poorly controlled in clinic- systolic BP-160s; at home 120s.   Stable-  # Hypokalemia- mild- recommend compliance with Kdur.    # Intermittently Elevated tumor marker CA 27-29-OCT 2023- 49; Again no clear trend noted [facility] 2023- 49]; await repeat tumor marker testing. . Monitor for now. Stable-  # OSTEOPENIA- JULy 2022-T-score of -1.5; continue Fosamax with vitamin D.' Stable-  # port flush every 3 months; keep for now.   # Vaccination: s/p Pneumonia vaccination-   # DISPOSITION: hepatitis- ordered ## follow up in 5 weeks- MD/ labs-cbc/cmp/port flush;  prior-PET scan-  Dr.B.   # I reviewed the blood work- with the patient in detail; also reviewed the imaging independently [as summarized above]; and with the patient in detail.   # 40 minutes face-to-face with the patient  discussing the above plan of care; more than 50% of time spent on prognosis/ natural history; counseling and coordination.   Cc;Dr.Tate.

## 2022-09-06 NOTE — Assessment & Plan Note (Addendum)
##   Right upper lobe/middle lobe- MALT [NO RT] - CT chest April 2024-  Chronic right upper lobe cavitary lesion with associated consolidations, bronchiectasis, and nodularity. Increased peribronchovascular consolidations and nodularity are seen in the posteromedial right lower lobe compared with most recent prior. S/p evaluation with Dr.A- and Bronch- MAY 31st, 2024-- POSITIVE FOR MALIGNANCY; CONSISTENT WITH INVOLVEMENT BY PATIENT'S KNOWN MALT / MARGINAL ZONE LYMPHOMA.  JULY 17th- PET- pending.  # Recommend rituximab weekly x 4.  Discussed the goal of treatment is not curative but to control disease.  Also discussed that low-grade lymphoma usually not curable; and in general have indolent clinical course.  Will plan to start treatment in 2 weeks.  #  Discussed mechanism of action of rituximab;and also potential side effects including but not limited to infusion reactions;rare infections/activation including hepatitis.  Check hepatitis work-up at baseline.    # 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 Clinically no evidence of recurrence. Continue arimidex for now.   # Elevated tumor marker CA 27-29-  Jan 2018- slightly up- Normal. Results are pending from today. Plan to re-check in 6 months.   # Osteoporosis surveillance-she is on fosomax/ vit D;  bone density May 2017 osteopenia.    # port flush every 2 months-   # follow up in 6 months/ labs- ca 27-29/port flush.   # DISPOSITION: # follow up in 2 weeks- MD;port/ labs- cbc/cmp; Rituximab # follow up in 3 weeks- labs-port/ cbc/bmp; rituximab- Dr.B   # 40 minutes face-to-face with the patient discussing the above plan of care; more than 50% of time spent on prognosis/ natural history; counseling and coordination.

## 2022-09-07 ENCOUNTER — Ambulatory Visit: Payer: Medicare Other | Admitting: Internal Medicine

## 2022-09-07 ENCOUNTER — Other Ambulatory Visit: Payer: Self-pay

## 2022-09-07 ENCOUNTER — Other Ambulatory Visit: Payer: Medicare Other

## 2022-09-15 ENCOUNTER — Encounter: Payer: Self-pay | Admitting: Internal Medicine

## 2022-09-18 DIAGNOSIS — K080 Exfoliation of teeth due to systemic causes: Secondary | ICD-10-CM | POA: Diagnosis not present

## 2022-09-19 ENCOUNTER — Encounter: Payer: Self-pay | Admitting: Internal Medicine

## 2022-09-20 ENCOUNTER — Inpatient Hospital Stay: Payer: Medicare Other | Attending: Internal Medicine | Admitting: Internal Medicine

## 2022-09-20 ENCOUNTER — Inpatient Hospital Stay: Payer: Medicare Other

## 2022-09-20 ENCOUNTER — Encounter: Payer: Self-pay | Admitting: Internal Medicine

## 2022-09-20 VITALS — BP 124/54 | HR 82 | Temp 98.6°F | Resp 16

## 2022-09-20 VITALS — BP 165/71 | HR 81 | Temp 98.0°F | Ht 60.0 in | Wt 125.6 lb

## 2022-09-20 DIAGNOSIS — C50812 Malignant neoplasm of overlapping sites of left female breast: Secondary | ICD-10-CM | POA: Insufficient documentation

## 2022-09-20 DIAGNOSIS — Z79811 Long term (current) use of aromatase inhibitors: Secondary | ICD-10-CM | POA: Diagnosis not present

## 2022-09-20 DIAGNOSIS — Z5111 Encounter for antineoplastic chemotherapy: Secondary | ICD-10-CM | POA: Diagnosis not present

## 2022-09-20 DIAGNOSIS — Z171 Estrogen receptor negative status [ER-]: Secondary | ICD-10-CM

## 2022-09-20 DIAGNOSIS — C884 Extranodal marginal zone B-cell lymphoma of mucosa-associated lymphoid tissue [MALT-lymphoma]: Secondary | ICD-10-CM | POA: Diagnosis not present

## 2022-09-20 DIAGNOSIS — Z7982 Long term (current) use of aspirin: Secondary | ICD-10-CM | POA: Diagnosis not present

## 2022-09-20 DIAGNOSIS — Z79899 Other long term (current) drug therapy: Secondary | ICD-10-CM | POA: Insufficient documentation

## 2022-09-20 LAB — CBC WITH DIFFERENTIAL (CANCER CENTER ONLY)
Abs Immature Granulocytes: 0 10*3/uL (ref 0.00–0.07)
Basophils Absolute: 0 10*3/uL (ref 0.0–0.1)
Basophils Relative: 1 %
Eosinophils Absolute: 0.1 10*3/uL (ref 0.0–0.5)
Eosinophils Relative: 1 %
HCT: 35.1 % — ABNORMAL LOW (ref 36.0–46.0)
Hemoglobin: 11.3 g/dL — ABNORMAL LOW (ref 12.0–15.0)
Immature Granulocytes: 0 %
Lymphocytes Relative: 39 %
Lymphs Abs: 1.6 10*3/uL (ref 0.7–4.0)
MCH: 29.5 pg (ref 26.0–34.0)
MCHC: 32.2 g/dL (ref 30.0–36.0)
MCV: 91.6 fL (ref 80.0–100.0)
Monocytes Absolute: 0.7 10*3/uL (ref 0.1–1.0)
Monocytes Relative: 16 %
Neutro Abs: 1.8 10*3/uL (ref 1.7–7.7)
Neutrophils Relative %: 43 %
Platelet Count: 210 10*3/uL (ref 150–400)
RBC: 3.83 MIL/uL — ABNORMAL LOW (ref 3.87–5.11)
RDW: 13.4 % (ref 11.5–15.5)
WBC Count: 4.1 10*3/uL (ref 4.0–10.5)
nRBC: 0 % (ref 0.0–0.2)

## 2022-09-20 LAB — CMP (CANCER CENTER ONLY)
ALT: 15 U/L (ref 0–44)
AST: 24 U/L (ref 15–41)
Albumin: 4.1 g/dL (ref 3.5–5.0)
Alkaline Phosphatase: 64 U/L (ref 38–126)
Anion gap: 8 (ref 5–15)
BUN: 15 mg/dL (ref 8–23)
CO2: 25 mmol/L (ref 22–32)
Calcium: 8.6 mg/dL — ABNORMAL LOW (ref 8.9–10.3)
Chloride: 104 mmol/L (ref 98–111)
Creatinine: 0.67 mg/dL (ref 0.44–1.00)
GFR, Estimated: >60 mL/min (ref >60)
Glucose, Bld: 104 mg/dL — ABNORMAL HIGH (ref 70–99)
Potassium: 3.5 mmol/L (ref 3.5–5.1)
Sodium: 137 mmol/L (ref 135–145)
Total Bilirubin: 0.4 mg/dL (ref 0.3–1.2)
Total Protein: 7.4 g/dL (ref 6.5–8.1)

## 2022-09-20 MED ORDER — ACETAMINOPHEN 325 MG PO TABS
650.0000 mg | ORAL_TABLET | Freq: Once | ORAL | Status: AC
  Administered 2022-09-20: 650 mg via ORAL
  Filled 2022-09-20: qty 2

## 2022-09-20 MED ORDER — HEPARIN SOD (PORK) LOCK FLUSH 100 UNIT/ML IV SOLN
INTRAVENOUS | Status: AC
  Filled 2022-09-20: qty 5

## 2022-09-20 MED ORDER — SODIUM CHLORIDE 0.9 % IV SOLN
375.0000 mg/m2 | Freq: Once | INTRAVENOUS | Status: AC
  Administered 2022-09-20: 600 mg via INTRAVENOUS
  Filled 2022-09-20: qty 50

## 2022-09-20 MED ORDER — DIPHENHYDRAMINE HCL 25 MG PO CAPS
50.0000 mg | ORAL_CAPSULE | Freq: Once | ORAL | Status: AC
  Administered 2022-09-20: 50 mg via ORAL
  Filled 2022-09-20: qty 2

## 2022-09-20 MED ORDER — HEPARIN SOD (PORK) LOCK FLUSH 100 UNIT/ML IV SOLN
500.0000 [IU] | Freq: Once | INTRAVENOUS | Status: AC | PRN
  Administered 2022-09-20: 500 [IU]
  Filled 2022-09-20: qty 5

## 2022-09-20 MED ORDER — SODIUM CHLORIDE 0.9 % IV SOLN
Freq: Once | INTRAVENOUS | Status: AC
  Filled 2022-09-20: qty 250

## 2022-09-20 NOTE — Patient Instructions (Signed)
[facility]  Discharge Instructions: Thank you for choosing [facility] to provide your oncology and hematology care.  If you have a lab appointment with the Cancer Center, please go directly to the Cancer Center and check in at the registration area.  Wear comfortable clothing and clothing appropriate for easy access to any Portacath or PICC line.   We strive to give you quality time with your provider. You may need to reschedule your appointment if you arrive late (15 or more minutes).  Arriving late affects you and other patients whose appointments are after yours.  Also, if you miss three or more appointments without notifying the office, you may be dismissed from the clinic at the provider's discretion.      For prescription refill requests, have your pharmacy contact our office and allow 72 hours for refills to be completed.    Today you received the following chemotherapy and/or immunotherapy agents Rituximab      To help prevent nausea and vomiting after your treatment, we encourage you to take your nausea medication as directed.  BELOW ARE SYMPTOMS THAT SHOULD BE REPORTED IMMEDIATELY: *FEVER GREATER THAN 100.4 F (38 C) OR HIGHER *CHILLS OR SWEATING *NAUSEA AND VOMITING THAT IS NOT CONTROLLED WITH YOUR NAUSEA MEDICATION *UNUSUAL SHORTNESS OF BREATH *UNUSUAL BRUISING OR BLEEDING *URINARY PROBLEMS (pain or burning when urinating, or frequent urination) *BOWEL PROBLEMS (unusual diarrhea, constipation, pain near the anus) TENDERNESS IN MOUTH AND THROAT WITH OR WITHOUT PRESENCE OF ULCERS (sore throat, sores in mouth, or a toothache) UNUSUAL RASH, SWELLING OR PAIN  UNUSUAL VAGINAL DISCHARGE OR ITCHING   Items with * indicate a potential emergency and should be followed up as soon as possible or go to the Emergency Department if any problems should occur.  Please show the CHEMOTHERAPY ALERT CARD or IMMUNOTHERAPY ALERT CARD at check-in to  the Emergency Department and triage nurse.  Should you have questions after your visit or need to cancel or reschedule your appointment, please contact [facility]  [phone] and follow the prompts.  Office hours are 8:00 a.m. to 4:30 p.m. Monday - Friday. Please note that voicemails left after 4:00 p.m. may not be returned until the following business day.  We are closed weekends and major holidays. You have access to a nurse at all times for urgent questions. Please call the main number to the clinic [phone] and follow the prompts.  For any non-urgent questions, you may also contact your provider using MyChart. We now offer e-Visits for anyone 47 and older to request care online for non-urgent symptoms. For details visit [email or web address].   Also download the MyChart app! Go to the app store, search "MyChart", open the app, select [facility], and log in with your MyChart username and password.  Rituximab Injection What is this medication? RITUXIMAB (ri TUX i mab) treats leukemia and lymphoma. It works by blocking a protein that causes cancer cells to grow and multiply. This helps to slow or stop the spread of cancer cells. It may also be used to treat autoimmune conditions, such as arthritis. It works by slowing down an overactive immune system. It is a monoclonal antibody. This medicine may be used for other purposes; ask your health care provider or pharmacist if you have questions. COMMON BRAND NAME(S): RIABNI, Rituxan, RUXIENCE, truxima What should I tell my care team before I take this medication? They need to know if you have any of these  conditions: Chest pain Heart disease Immune system problems Infection, such as chickenpox, cold sores, hepatitis B, herpes Irregular heartbeat or rhythm Kidney disease Low blood counts, such as low white cells, platelets, red cells Lung disease Recent or upcoming vaccine An unusual or allergic reaction  to rituximab, other medications, foods, dyes, or preservatives Pregnant or trying to get pregnant Breast-feeding How should I use this medication? This medication is injected into a vein. It is given by a care team in a hospital or clinic setting. A special MedGuide will be given to you before each treatment. Be sure to read this information carefully each time. Talk to your care team about the use of this medication in children. While this medication may be prescribed for children as young as 6 months for selected conditions, precautions do apply. Overdosage: If you think you have taken too much of this medicine contact a poison control center or emergency room at once. NOTE: This medicine is only for you. Do not share this medicine with others. What if I miss a dose? Keep appointments for follow-up doses. It is important not to miss your dose. Call your care team if you are unable to keep an appointment. What may interact with this medication? Do not take this medication with any of the following: Live vaccines This medication may also interact with the following: Cisplatin This list may not describe all possible interactions. Give your health care provider a list of all the medicines, herbs, non-prescription drugs, or dietary supplements you use. Also tell them if you smoke, drink alcohol, or use illegal drugs. Some items may interact with your medicine. What should I watch for while using this medication? Your condition will be monitored carefully while you are receiving this medication. You may need blood work while taking this medication. This medication can cause serious infusion reactions. To reduce the risk your care team may give you other medications to take before receiving this one. Be sure to follow the directions from your care team. This medication may increase your risk of getting an infection. Call your care team for advice if you get a fever, chills, sore throat, or other  symptoms of a cold or flu. Do not treat yourself. Try to avoid being around people who are sick. Call your care team if you are around anyone with measles, chickenpox, or if you develop sores or blisters that do not heal properly. Avoid taking medications that contain aspirin, acetaminophen, ibuprofen, naproxen, or ketoprofen unless instructed by your care team. These medications may hide a fever. This medication may cause serious skin reactions. They can happen weeks to months after starting the medication. Contact your care team right away if you notice fevers or flu-like symptoms with a rash. The rash may be red or purple and then turn into blisters or peeling of the skin. You may also notice a red rash with swelling of the face, lips, or lymph nodes in your neck or under your arms. In some patients, this medication may cause a serious brain infection that may cause death. If you have any problems seeing, thinking, speaking, walking, or standing, tell your care team right away. If you cannot reach your care team, urgently seek another source of medical care. Talk to your care team if you may be pregnant. Serious birth defects can occur if you take this medication during pregnancy and for 12 months after the last dose. You will need a negative pregnancy test before starting this medication. Contraception is recommended  while taking this medication and for 12 months after the last dose. Your care team can help you find the option that works for you. Do not breastfeed while taking this medication and for at least 6 months after the last dose. What side effects may I notice from receiving this medication? Side effects that you should report to your care team as soon as possible: Allergic reactions or angioedema--skin rash, itching or hives, swelling of the face, eyes, lips, tongue, arms, or legs, trouble swallowing or breathing Bowel blockage--stomach cramping, unable to have a bowel movement or pass gas,  loss of appetite, vomiting Dizziness, loss of balance or coordination, confusion or trouble speaking Heart attack--pain or tightness in the chest, shoulders, arms, or jaw, nausea, shortness of breath, cold or clammy skin, feeling faint or lightheaded Heart rhythm changes--fast or irregular heartbeat, dizziness, feeling faint or lightheaded, chest pain, trouble breathing Infection--fever, chills, cough, sore throat, wounds that don't heal, pain or trouble when passing urine, general feeling of discomfort or being unwell Infusion reactions--chest pain, shortness of breath or trouble breathing, feeling faint or lightheaded Kidney injury--decrease in the amount of urine, swelling of the ankles, hands, or feet Liver injury--right upper belly pain, loss of appetite, nausea, light-colored stool, dark yellow or brown urine, yellowing skin or eyes, unusual weakness or fatigue Redness, blistering, peeling, or loosening of the skin, including inside the mouth Stomach pain that is severe, does not go away, or gets worse Tumor lysis syndrome (TLS)--nausea, vomiting, diarrhea, decrease in the amount of urine, dark urine, unusual weakness or fatigue, confusion, muscle pain or cramps, fast or irregular heartbeat, joint pain Side effects that usually do not require medical attention (report to your care team if they continue or are bothersome): Headache Joint pain Nausea Runny or stuffy nose Unusual weakness or fatigue This list may not describe all possible side effects. Call your doctor for medical advice about side effects. You may report side effects to FDA at 1-800-FDA-1088. Where should I keep my medication? This medication is given in a hospital or clinic. It will not be stored at home. NOTE: This sheet is a summary. It may not cover all possible information. If you have questions about this medicine, talk to your doctor, pharmacist, or health care provider.  2024 Elsevier/Gold Standard (2021-06-23  00:00:00)

## 2022-09-20 NOTE — Progress Notes (Signed)
Rapid Infusion Rituximab Pharmacist Evaluation  Andrea Rodgers is a 82 y.o. female being treated with rituximab for Lymphoma. This patient may be considered for RIR.   A pharmacist has verified the patient tolerated rituximab infusions per the [facility] standard infusion protocol without grade 3-4 infusion reactions. The treatment plan will be updated to reflect RIR if the patient qualifies per the checklist below:   Age > 74 years old Yes   Clinically significant cardiovascular disease No   Circulating lymphocyte count < 5000/uL prior to cycle two Yes  Lab Results  Component Value Date   LYMPHSABS 1.6 09/20/2022    Prior documented grade 3-4 infusion reaction to rituximab No   Prior documented grade 1-2 infusion reaction to rituximab (If YES, Pharmacist will confirm with Physician if patient is still a candidate for RIR) No   Previous rituximab infusion within the past 6 months Yes   Treatment Plan updated orders to reflect RIR Yes    Andrea Rodgers does meet the criteria for Rapid Infusion Rituximab. This patient is going to be switched to rapid infusion rituximab.   Ebony Hail, Pharm.D., CPP 09/20/2022@9 :59 PM

## 2022-09-20 NOTE — Assessment & Plan Note (Addendum)
##   Right upper lobe/middle lobe- MALT [NO RT] -  S/p evaluation with Dr.A- and Bronch- MAY 31st, 2024-- POSITIVE FOR MALIGNANCY; CONSISTENT WITH INVOLVEMENT BY PATIENT'S KNOWN MALT / MARGINAL ZONE LYMPHOMA.  JULY 17th- PET- Chronic irregular patchy geographic regions of consolidation and nodular thickening of the peribronchovascular interstitium throughout the mid to upper right lung with associated patchy mild hypermetabolism and marked varicoid bronchiectasis, overall mildly progressive since 03/02/2021 PET-CT, including a new hypermetabolic 3.0 x 2.5 cm masslike focus of consolidation in the medial aspect of the superior segment right lower lobe. Favor mild progression of pulmonary lymphoma. Otherwise no evidence of hypermetabolic metastatic disease. JULY 2024- Hepatitis-work up-NEG. JULy 2024- Rituximab weekly x 4.  Discussed the goal of treatment is not curative but to control disease.    # Proceed with rituximab #1 today. Labs-CBC/chemistries were reviewed with the patient.  I again  discussed mechanism of action of rituximab;and also potential side effects including but not limited to infusion reactions; rare  infections;and general precautions.   # 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 Clinically no evidence of recurrence. Continue arimidex for now. Stable.   # Elevated tumor marker CA 27-29-  Jan 2018- stable.   # Osteoporosis surveillance-she is on fosomax/ vit D;  bone density May 2017 osteopenia.    # HTN- elevated BP- recommend checking at home; and bring a log at next visit. Continue current medications.   # IV access: port flush every 2 months-   #Incidental findings on Imaging  PET-CT ,JULY 2024: Atherosclerosis cystocele diverticulosis -I reviewed/discussed/counseled the patient.   # DISPOSITION: # rituxan today.  # as per IS  # in 1 week-MD: NO labs- rituxan # in 2 weeks- MD: NO labs- rituxan #  follow up in 3 weeks-MD; labs-port/ cbc/bmp; rituximab- Dr.B   # I reviewed the blood work- with the patient in detail; also reviewed the imaging independently [as summarized above]; and with the patient in detail.

## 2022-09-20 NOTE — Patient Instructions (Signed)

## 2022-09-20 NOTE — Progress Notes (Signed)
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 STABLE band like opacity in RUL/RML MAY 2017 CT-NED;   June 2024- Dr.Aleskerov- A. LYMPH NODE, 10R; EBUS-ASSISTED FNA:  - POSITIVE FOR MALIGNANCY.  - CONSISTENT WITH INVOLVEMENT BY PATIENT'S KNOWN MALT / MARGINAL ZONE  LYMPHOMA.   Specimen is adequate for interpretation.   Comment:  Given the patient's history of MALT lymphoma CD20 and CD43 stains were  performed. Co-expression of CD20 and CD43 is present and supports the  above diagnosis.   # JULY 17th- PET- IMPRESSION: 1. Chronic irregular patchy geographic regions of consolidation and nodular thickening of the peribronchovascular interstitium throughout the mid to upper right lung with associated patchy mild hypermetabolism and marked varicoid bronchiectasis, overall mildly progressive since 03/02/2021 PET-CT, including a new hypermetabolic 3.0 x 2.5 cm masslike focus of consolidation in the medial aspect of the superior segment right lower lobe. Favor mild progression of pulmonary lymphoma. Otherwise no evidence of hypermetabolic metastatic disease. JULY 2024- Hepatitis-work up-NEG.   # [id number] 2016- MUGA 61 %; DEC 22nd 2016- 65%  # LEFT UE LYMPHEDEMA s/p PT ----------------------------------------------------     DIAGNOSIS: LEFT BREAST CA  STAGE:  II ;GOALS: curative  CURRENT/MOST RECENT THERAPY: arimidex    Mucosa-associated lymphoid tissue (MALT) lymphoma (HCC)  04/21/2015 Initial Diagnosis   Mucosa-associated lymphoid tissue (MALT) lymphoma (HCC)   09/06/2022 Cancer Staging   Staging form: Hodgkin and Non-Hodgkin Lymphoma, AJCC 7th Edition - Clinical: Stage I (E - Extranodal, A - Asymptomatic) - Signed by Earna Coder, MD on 09/06/2022 Source of metastatic specimen: Lung Immune suppression conditions: None   09/19/2022 -  Chemotherapy   Patient is on Treatment Plan : NON-HODGKINS LYMPHOMA Rituximab q7d     Carcinoma of overlapping sites of left breast in female, estrogen receptor negative (HCC)  09/23/2015 Initial Diagnosis   Cancer of overlapping sites of left female breast (HCC)     INTERVAL HISTORY: Ambulating independently.  Accompanied by daughter.  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  No worsening cough or shortness of breath.  Denies any nausea vomiting headache.  No fevers or chills.  No new lumps or bumps.  Review of Systems  Constitutional:  Positive for malaise/fatigue. Negative for chills, diaphoresis and fever.  HENT:  Negative for nosebleeds and sore throat.   Eyes:  Negative for double vision.  Respiratory:  Negative for cough, hemoptysis, sputum production, shortness of breath and wheezing.   Cardiovascular:  Negative for chest pain, palpitations, orthopnea and leg swelling.  Gastrointestinal:  Negative for abdominal pain, blood in stool, constipation, diarrhea, heartburn, melena, nausea and vomiting.  Genitourinary:  Negative for dysuria, frequency and urgency.  Musculoskeletal:  Positive for back pain and joint  pain.  Skin: Negative.  Negative for itching and rash.   Neurological:  Negative for dizziness, tingling, focal weakness, weakness and headaches.  Endo/Heme/Allergies:  Does not bruise/bleed easily.  Psychiatric/Behavioral:  Negative for depression. The patient is not nervous/anxious and does not have insomnia.      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 2 cm axillary node. One of 11 nodes positive on post-adjuvant chemotherapy axillary dissection. 3+ centimeter tumor.   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   MALT lymphoma (HCC)    Murmur    Osteoporosis    Personal history of chemotherapy     PAST SURGICAL HISTORY :   Past Surgical History:  Procedure Laterality Date   ABDOMINAL HYSTERECTOMY     APPENDECTOMY     BREAST SURGERY Left 02/25/14   Modified radical mastectomy, T2 N1. Grade 3, ER, PR negative, HER-2/neu 3+.   COLONOSCOPY WITH PROPOFOL N/A 01/26/2016   Procedure: COLONOSCOPY WITH PROPOFOL;  Surgeon: Earline Mayotte, MD;  Location: [facility] ENDOSCOPY;  Service: Endoscopy;  Laterality: N/A;   MASTECTOMY Left 02-25-14   Dr Lemar Livings   VIDEO BRONCHOSCOPY WITH ENDOBRONCHIAL ULTRASOUND N/A 07/14/2022   Procedure: VIDEO BRONCHOSCOPY WITH ENDOBRONCHIAL ULTRASOUND;  Surgeon: Vida Rigger, MD;  Location: [facility] ORS;  Service: Thoracic;  Laterality: N/A;    FAMILY HISTORY :   Family History  Problem Relation Age of Onset   Hypertension Mother    Hypertension Maternal Aunt    Hypertension Maternal Uncle    Hypertension Maternal Aunt    Hypertension Maternal Aunt    Breast cancer Neg Hx     SOCIAL HISTORY:   Social History   Tobacco Use   Smoking status: Never   Smokeless tobacco: Never  Vaping Use   Vaping status: Never Used  Substance Use Topics   Alcohol use: No   Drug  use: No    ALLERGIES:  has No Known Allergies.  MEDICATIONS:  Current Outpatient Medications  Medication Sig Dispense Refill   alendronate (FOSAMAX) 70 MG tablet Take 70 mg by mouth once a week. Take with a full glass of water on an empty stomach.  Wednesdays     amLODipine (NORVASC) 2.5 MG tablet Take 2.5 mg by mouth every morning.     anastrozole (ARIMIDEX) 1 MG tablet TAKE 1 TABLET BY MOUTH EVERY DAY (Patient taking differently: Take 1 mg by mouth every morning.) 90 tablet 3   aspirin 81 MG tablet Take 81 mg by mouth daily.     cholecalciferol (VITAMIN D) 1000 UNITS tablet Take 1,000 Units by mouth daily.     lidocaine-prilocaine (EMLA) cream Apply 1 application. topically as needed. Apply to port and cover with saran wrap 1-2 hours prior to port access 30 g 4   losartan-hydrochlorothiazide (HYZAAR) 100-25 MG per tablet Take 1 tablet by mouth every morning.     Multiple Vitamins-Minerals (MULTIVITAMIN WITH MINERALS) tablet Take 1 tablet by mouth daily.     potassium chloride SA (KLOR-CON M) 20 MEQ tablet Take 20 mEq by mouth every morning.     simvastatin (ZOCOR) 40 MG tablet Take 40 mg by mouth every morning.     No current facility-administered medications for this visit.   Facility-Administered Medications Ordered  in Other Visits  Medication Dose Route Frequency Provider Last Rate Last Admin   sodium chloride flush (NS) 0.9 % injection 10 mL  10 mL Intravenous PRN Earna Coder, MD   10 mL at 10/19/16 1054   sodium chloride flush (NS) 0.9 % injection 10 mL  10 mL Intravenous PRN Earna Coder, MD   10 mL at 12/13/17 1050    PHYSICAL EXAMINATION: ECOG PERFORMANCE STATUS: 0 - Asymptomatic  BP (!) 165/71 (BP Location: Right Arm, Patient Position: Sitting, Cuff Size: Normal)   Pulse 81   Temp 98 F (36.7 C) (Tympanic)   Ht 5' (1.524 m)   Wt 125 lb 9.6 oz (57 kg)   SpO2 97%   BMI 24.53 kg/m   Filed Weights   09/20/22 0817  Weight: 125 lb 9.6 oz (57 kg)      Physical Exam Constitutional:      Comments: Frail-appearing female patient.  She is walking herself.  Alone.  HENT:     Head: Normocephalic and atraumatic.     Mouth/Throat:     Pharynx: No oropharyngeal exudate.  Eyes:     Pupils: Pupils are equal, round, and reactive to light.  Cardiovascular:     Rate and Rhythm: Normal rate and regular rhythm.  Pulmonary:     Effort: No respiratory distress.     Breath sounds: No wheezing.  Abdominal:     General: Bowel sounds are normal. There is no distension.     Palpations: Abdomen is soft. There is no mass.     Tenderness: There is no abdominal tenderness. There is no guarding or rebound.  Musculoskeletal:        General: No tenderness. Normal range of motion.     Cervical back: Normal range of motion and neck supple.  Skin:    General: Skin is warm.  Neurological:     Mental Status: She is alert and oriented to person, place, and time.  Psychiatric:        Mood and Affect: Affect normal.      LABORATORY DATA:  I have reviewed the data as listed    Component Value Date/Time   NA 137 09/20/2022 0817   NA 136 05/27/2014 0942   K 3.5 09/20/2022 0817   K 3.4 (L) 05/27/2014 0942   CL 104 09/20/2022 0817   CL 102 05/27/2014 0942   CO2 25 09/20/2022 0817   CO2 29 05/27/2014 0942   GLUCOSE 104 (H) 09/20/2022 0817   GLUCOSE 106 (H) 05/27/2014 0942   BUN 15 09/20/2022 0817   BUN 13 05/27/2014 0942   CREATININE 0.67 09/20/2022 0817   CREATININE 0.58 05/27/2014 0942   CALCIUM 8.6 (L) 09/20/2022 0817   CALCIUM 8.6 (L) 05/27/2014 0942   PROT 7.4 09/20/2022 0817   PROT 6.8 05/27/2014 0942   ALBUMIN 4.1 09/20/2022 0817   ALBUMIN 3.8 05/27/2014 0942   AST 24 09/20/2022 0817   ALT 15 09/20/2022 0817   ALT 14 05/27/2014 0942   ALKPHOS 64 09/20/2022 0817   ALKPHOS 69 05/27/2014 0942   BILITOT 0.4 09/20/2022 0817   GFRNONAA >60 09/20/2022 0817   GFRNONAA >60 05/27/2014 0942   GFRAA >60 10/22/2019 1457   GFRAA >60  05/27/2014 0942    No results found for: "SPEP", "UPEP"  Lab Results  Component Value Date   WBC 4.1 09/20/2022   NEUTROABS 1.8 09/20/2022   HGB 11.3 (L) 09/20/2022   HCT 35.1 (L) 09/20/2022   MCV 91.6  09/20/2022   PLT 210 09/20/2022      Chemistry      Component Value Date/Time   NA 137 09/20/2022 0817   NA 136 05/27/2014 0942   K 3.5 09/20/2022 0817   K 3.4 (L) 05/27/2014 0942   CL 104 09/20/2022 0817   CL 102 05/27/2014 0942   CO2 25 09/20/2022 0817   CO2 29 05/27/2014 0942   BUN 15 09/20/2022 0817   BUN 13 05/27/2014 0942   CREATININE 0.67 09/20/2022 0817   CREATININE 0.58 05/27/2014 0942      Component Value Date/Time   CALCIUM 8.6 (L) 09/20/2022 0817   CALCIUM 8.6 (L) 05/27/2014 0942   ALKPHOS 64 09/20/2022 0817   ALKPHOS 69 05/27/2014 0942   AST 24 09/20/2022 0817   ALT 15 09/20/2022 0817   ALT 14 05/27/2014 0942   BILITOT 0.4 09/20/2022 0817       ASSESSMENT & PLAN:   Mucosa-associated lymphoid tissue (MALT) lymphoma (HCC) ## Right upper lobe/middle lobe- MALT [NO RT] -  S/p evaluation with Dr.A- and Bronch- MAY 31st, 2024-- POSITIVE FOR MALIGNANCY; CONSISTENT WITH INVOLVEMENT BY PATIENT'S KNOWN MALT / MARGINAL ZONE LYMPHOMA.  JULY 17th- PET- Chronic irregular patchy geographic regions of consolidation and nodular thickening of the peribronchovascular interstitium throughout the mid to upper right lung with associated patchy mild hypermetabolism and marked varicoid bronchiectasis, overall mildly progressive since 03/02/2021 PET-CT, including a new hypermetabolic 3.0 x 2.5 cm masslike focus of consolidation in the medial aspect of the superior segment right lower lobe. Favor mild progression of pulmonary lymphoma. Otherwise no evidence of hypermetabolic metastatic disease. JULY 2024- Hepatitis-work up-NEG. JULy 2024- Rituximab weekly x 4.  Discussed the goal of treatment is not curative but to control disease.    # Proceed with rituximab #1 today.  Labs-CBC/chemistries were reviewed with the patient.  I again  discussed mechanism of action of rituximab;and also potential side effects including but not limited to infusion reactions; rare  infections;and general precautions.   # 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 Clinically no evidence of recurrence. Continue arimidex for now. Stable.   # Elevated tumor marker CA 27-29-  Jan 2018- stable.   # Osteoporosis surveillance-she is on fosomax/ vit D;  bone density May 2017 osteopenia.    # HTN- elevated BP- recommend checking at home; and bring a log at next visit. Continue current medications.   # IV access: port flush every 2 months-   #Incidental findings on Imaging  PET-CT ,JULY 2024: Atherosclerosis cystocele diverticulosis -I reviewed/discussed/counseled the patient.   # DISPOSITION: # rituxan today.  # as per IS  # in 1 week-MD: NO labs- rituxan # in 2 weeks- MD: NO labs- rituxan # follow up in 3 weeks-MD; labs-port/ cbc/bmp; rituximab- Dr.B   # I reviewed the blood work- with the patient in detail; also reviewed the imaging independently [as summarized above]; and with the patient in detail.         Earna Coder, MD 09/20/2022 9:03 AM

## 2022-09-20 NOTE — Progress Notes (Signed)
No concerns today 

## 2022-09-22 ENCOUNTER — Other Ambulatory Visit: Payer: Self-pay

## 2022-09-25 NOTE — Assessment & Plan Note (Signed)
##   Right upper lobe/middle lobe- MALT [NO RT] -  S/p evaluation with Dr.A- and Bronch- MAY 31st, 2024-- POSITIVE FOR MALIGNANCY; CONSISTENT WITH INVOLVEMENT BY PATIENT'S KNOWN MALT / MARGINAL ZONE LYMPHOMA.  JULY 17th- PET- Chronic irregular patchy geographic regions of consolidation and nodular thickening of the peribronchovascular interstitium throughout the mid to upper right lung with associated patchy mild hypermetabolism and marked varicoid bronchiectasis, overall mildly progressive since 03/02/2021 PET-CT, including a new hypermetabolic 3.0 x 2.5 cm masslike focus of consolidation in the medial aspect of the superior segment right lower lobe. Favor mild progression of pulmonary lymphoma. Otherwise no evidence of hypermetabolic metastatic disease. JULY 2024- Hepatitis-work up-NEG. JULy 2024- Rituximab weekly x 4.  Discussed the goal of treatment is not curative but to control disease.    # Proceed with rituximab #2 of planned 4 today. Labs-CBC/chemistries were reviewed with the patient.   # Nausea- G-1- recommend anti-emetics prn. Add prophylactic- Zofran PO.   # 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 Clinically no evidence of recurrence. Continue arimidex for now. Stable.   # Elevated tumor marker CA 27-29-  Jan 2018- Stable.   # Osteoporosis surveillance-she is on fosomax/ vit D;  bone density May 2017 osteopenia- Stable.     # HTN- elevated BP- recommend checking at home; and bring a log at next visit. Continue current medications- Stable. .   # IV access: port flush every 2 months-   # DISPOSITION: # rituxan today.  # as per IS  # in 1 week-MD: NO labs- rituxan # follow up in 2 weeks-MD; labs-port/ cbc/bmp; rituximab- Dr.B

## 2022-09-25 NOTE — Progress Notes (Unsigned)
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 STABLE band like opacity in RUL/RML MAY 2017 CT-NED;   June 2024- Dr.Aleskerov- A. LYMPH NODE, 10R; EBUS-ASSISTED FNA:  - POSITIVE FOR MALIGNANCY.  - CONSISTENT WITH INVOLVEMENT BY PATIENT'S KNOWN MALT / MARGINAL ZONE  LYMPHOMA.   Specimen is adequate for interpretation.   Comment:  Given the patient's history of MALT lymphoma CD20 and CD43 stains were  performed. Co-expression of CD20 and CD43 is present and supports the  above diagnosis.   # JULY 17th- PET- IMPRESSION: 1. Chronic irregular patchy geographic regions of consolidation and nodular thickening of the peribronchovascular interstitium throughout the mid to upper right lung with associated patchy mild hypermetabolism and marked varicoid bronchiectasis, overall mildly progressive since 03/02/2021 PET-CT, including a new hypermetabolic 3.0 x 2.5 cm masslike focus of consolidation in the medial aspect of the superior segment right lower lobe. Favor mild progression of pulmonary lymphoma. Otherwise no evidence of hypermetabolic metastatic disease. JULY 2024- Hepatitis-work up-NEG.   # [id number] 2016- MUGA 61 %; DEC 22nd 2016- 65%  # LEFT UE LYMPHEDEMA s/p PT ----------------------------------------------------     DIAGNOSIS: LEFT BREAST CA  STAGE:  II ;GOALS: curative  CURRENT/MOST RECENT THERAPY: arimidex    Mucosa-associated lymphoid tissue (MALT) lymphoma (HCC)  04/21/2015 Initial Diagnosis   Mucosa-associated lymphoid tissue (MALT) lymphoma (HCC)   09/06/2022 Cancer Staging   Staging form: Hodgkin and Non-Hodgkin Lymphoma, AJCC 7th Edition - Clinical: Stage I (E - Extranodal, A - Asymptomatic) - Signed by Earna Coder, MD on 09/06/2022 Source of metastatic specimen: Lung Immune suppression conditions: None   09/20/2022 -  Chemotherapy   Patient is on Treatment Plan : NON-HODGKINS LYMPHOMA Rituximab q7d     Carcinoma of overlapping sites of left breast in female, estrogen receptor negative (HCC)  09/23/2015 Initial Diagnosis   Cancer of overlapping sites of left female breast (HCC)     INTERVAL HISTORY: Ambulating independently.  Accompanied by daughter.  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  Currently, s/p Rituxan- s/p 1 infusion  last week.   Had episode of nausea- currently resolved. No vomiting.   Other wise, Continues to be asymptomatic.  No worsening cough or shortness of breath.  No fevers or chills.  No new lumps or bumps.  Review of Systems  Constitutional:  Positive for malaise/fatigue. Negative for chills, diaphoresis and fever.  HENT:  Negative for nosebleeds and sore throat.   Eyes:  Negative for double vision.  Respiratory:  Negative for cough, hemoptysis, sputum production, shortness of breath and wheezing.   Cardiovascular:  Negative for chest pain, palpitations, orthopnea and leg swelling.  Gastrointestinal:  Negative for abdominal pain, blood in stool, constipation, diarrhea, heartburn, melena, nausea and vomiting.  Genitourinary:  Negative for dysuria, frequency and urgency.  Musculoskeletal:   Positive for back pain and joint pain.  Skin: Negative.  Negative for itching and rash.  Neurological:  Negative for dizziness, tingling, focal weakness, weakness and headaches.  Endo/Heme/Allergies:  Does not bruise/bleed easily.  Psychiatric/Behavioral:  Negative for depression. The patient is not nervous/anxious and does not have insomnia.      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 2 cm axillary node. One of 11 nodes positive on post-adjuvant chemotherapy axillary dissection. 3+ centimeter tumor.   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   MALT lymphoma (HCC)    Murmur    Osteoporosis    Personal history of chemotherapy     PAST SURGICAL HISTORY :   Past Surgical History:  Procedure Laterality Date   ABDOMINAL HYSTERECTOMY     APPENDECTOMY     BREAST SURGERY Left 02/25/14   Modified radical mastectomy, T2 N1. Grade 3, ER, PR negative, HER-2/neu 3+.   COLONOSCOPY WITH PROPOFOL N/A 01/26/2016   Procedure: COLONOSCOPY WITH PROPOFOL;  Surgeon: Earline Mayotte, MD;  Location: [facility] ENDOSCOPY;  Service: Endoscopy;  Laterality: N/A;   MASTECTOMY Left 02-25-14   Dr Lemar Livings   VIDEO BRONCHOSCOPY WITH ENDOBRONCHIAL ULTRASOUND N/A 07/14/2022   Procedure: VIDEO BRONCHOSCOPY WITH ENDOBRONCHIAL ULTRASOUND;  Surgeon: Vida Rigger, MD;  Location: [facility] ORS;  Service: Thoracic;  Laterality: N/A;    FAMILY HISTORY :   Family History  Problem Relation Age of Onset   Hypertension Mother    Hypertension Maternal Aunt    Hypertension Maternal Uncle    Hypertension Maternal Aunt    Hypertension Maternal Aunt    Breast cancer Neg Hx     SOCIAL HISTORY:   Social History   Tobacco Use   Smoking status: Never   Smokeless tobacco: Never   Vaping Use   Vaping status: Never Used  Substance Use Topics   Alcohol use: No   Drug use: No    ALLERGIES:  has No Known Allergies.  MEDICATIONS:  Current Outpatient Medications  Medication Sig Dispense Refill   alendronate (FOSAMAX) 70 MG tablet Take 70 mg by mouth once a week. Take with a full glass of water on an empty stomach.  Wednesdays     amLODipine (NORVASC) 2.5 MG tablet Take 2.5 mg by mouth every morning.     anastrozole (ARIMIDEX) 1 MG tablet TAKE 1 TABLET BY MOUTH EVERY DAY (Patient taking differently: Take 1 mg by mouth every morning.) 90 tablet 3   aspirin 81 MG tablet Take 81 mg by mouth daily.     cholecalciferol (VITAMIN D) 1000 UNITS tablet Take 1,000 Units by mouth daily.     lidocaine-prilocaine (EMLA) cream Apply 1 application. topically as needed. Apply to port and cover with saran wrap 1-2 hours prior to port access 30 g 4   losartan-hydrochlorothiazide (HYZAAR) 100-25 MG per tablet Take 1 tablet by mouth every morning.     Multiple Vitamins-Minerals (MULTIVITAMIN WITH MINERALS) tablet Take 1 tablet by mouth daily.     ondansetron (ZOFRAN) 8 MG tablet One pill every 8 hours as needed for nausea/vomitting. 40 tablet 1   potassium chloride SA (KLOR-CON M) 20 MEQ tablet Take 20 mEq  by mouth every morning.     simvastatin (ZOCOR) 40 MG tablet Take 40 mg by mouth every morning.     No current facility-administered medications for this visit.   Facility-Administered Medications Ordered in Other Visits  Medication Dose Route Frequency Provider Last Rate Last Admin   heparin lock flush 100 UNIT/ML injection            sodium chloride flush (NS) 0.9 % injection 10 mL  10 mL Intravenous PRN Earna Coder, MD   10 mL at 10/19/16 1054   sodium chloride flush (NS) 0.9 % injection 10 mL  10 mL Intravenous PRN Earna Coder, MD   10 mL at 12/13/17 1050    PHYSICAL EXAMINATION: ECOG PERFORMANCE STATUS: 0 - Asymptomatic  BP (!) 174/62 (BP Location: Right  Arm, Patient Position: Sitting, Cuff Size: Normal)   Pulse 78   Temp (!) 97.5 F (36.4 C) (Tympanic)   Ht 5' (1.524 m)   Wt 125 lb 3.2 oz (56.8 kg)   SpO2 97%   BMI 24.45 kg/m   Filed Weights   09/26/22 0928  Weight: 125 lb 3.2 oz (56.8 kg)      Physical Exam Constitutional:      Comments: Frail-appearing female patient.  She is walking herself.  Alone.  HENT:     Head: Normocephalic and atraumatic.     Mouth/Throat:     Pharynx: No oropharyngeal exudate.  Eyes:     Pupils: Pupils are equal, round, and reactive to light.  Cardiovascular:     Rate and Rhythm: Normal rate and regular rhythm.  Pulmonary:     Effort: No respiratory distress.     Breath sounds: No wheezing.  Abdominal:     General: Bowel sounds are normal. There is no distension.     Palpations: Abdomen is soft. There is no mass.     Tenderness: There is no abdominal tenderness. There is no guarding or rebound.  Musculoskeletal:        General: No tenderness. Normal range of motion.     Cervical back: Normal range of motion and neck supple.  Skin:    General: Skin is warm.  Neurological:     Mental Status: She is alert and oriented to person, place, and time.  Psychiatric:        Mood and Affect: Affect normal.      LABORATORY DATA:  I have reviewed the data as listed    Component Value Date/Time   NA 137 09/20/2022 0817   NA 136 05/27/2014 0942   K 3.5 09/20/2022 0817   K 3.4 (L) 05/27/2014 0942   CL 104 09/20/2022 0817   CL 102 05/27/2014 0942   CO2 25 09/20/2022 0817   CO2 29 05/27/2014 0942   GLUCOSE 104 (H) 09/20/2022 0817   GLUCOSE 106 (H) 05/27/2014 0942   BUN 15 09/20/2022 0817   BUN 13 05/27/2014 0942   CREATININE 0.67 09/20/2022 0817   CREATININE 0.58 05/27/2014 0942   CALCIUM 8.6 (L) 09/20/2022 0817   CALCIUM 8.6 (L) 05/27/2014 0942   PROT 7.4 09/20/2022 0817   PROT 6.8 05/27/2014 0942   ALBUMIN 4.1 09/20/2022 0817   ALBUMIN 3.8 05/27/2014 0942   AST 24 09/20/2022 0817    ALT 15 09/20/2022 0817   ALT 14 05/27/2014 0942   ALKPHOS 64 09/20/2022 0817   ALKPHOS 69 05/27/2014 0942   BILITOT 0.4 09/20/2022 0817   GFRNONAA >60 09/20/2022 0817   GFRNONAA >60 05/27/2014  0981   GFRAA >60 10/22/2019 1457   GFRAA >60 05/27/2014 0942    No results found for: "SPEP", "UPEP"  Lab Results  Component Value Date   WBC 4.1 09/20/2022   NEUTROABS 1.8 09/20/2022   HGB 11.3 (L) 09/20/2022   HCT 35.1 (L) 09/20/2022   MCV 91.6 09/20/2022   PLT 210 09/20/2022      Chemistry      Component Value Date/Time   NA 137 09/20/2022 0817   NA 136 05/27/2014 0942   K 3.5 09/20/2022 0817   K 3.4 (L) 05/27/2014 0942   CL 104 09/20/2022 0817   CL 102 05/27/2014 0942   CO2 25 09/20/2022 0817   CO2 29 05/27/2014 0942   BUN 15 09/20/2022 0817   BUN 13 05/27/2014 0942   CREATININE 0.67 09/20/2022 0817   CREATININE 0.58 05/27/2014 0942      Component Value Date/Time   CALCIUM 8.6 (L) 09/20/2022 0817   CALCIUM 8.6 (L) 05/27/2014 0942   ALKPHOS 64 09/20/2022 0817   ALKPHOS 69 05/27/2014 0942   AST 24 09/20/2022 0817   ALT 15 09/20/2022 0817   ALT 14 05/27/2014 0942   BILITOT 0.4 09/20/2022 0817       ASSESSMENT & PLAN:   Mucosa-associated lymphoid tissue (MALT) lymphoma (HCC) ## Right upper lobe/middle lobe- MALT [NO RT] -  S/p evaluation with Dr.A- and Bronch- MAY 31st, 2024-- POSITIVE FOR MALIGNANCY; CONSISTENT WITH INVOLVEMENT BY PATIENT'S KNOWN MALT / MARGINAL ZONE LYMPHOMA.  JULY 17th- PET- Chronic irregular patchy geographic regions of consolidation and nodular thickening of the peribronchovascular interstitium throughout the mid to upper right lung with associated patchy mild hypermetabolism and marked varicoid bronchiectasis, overall mildly progressive since 03/02/2021 PET-CT, including a new hypermetabolic 3.0 x 2.5 cm masslike focus of consolidation in the medial aspect of the superior segment right lower lobe. Favor mild progression of pulmonary lymphoma.  Otherwise no evidence of hypermetabolic metastatic disease. JULY 2024- Hepatitis-work up-NEG. JULy 2024- Rituximab weekly x 4.  Discussed the goal of treatment is not curative but to control disease.    # Proceed with rituximab #2 of planned 4 today. Labs-CBC/chemistries were reviewed with the patient.   # Nausea- G-1- recommend anti-emetics prn. Add prophylactic- Zofran PO.   # 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 Clinically no evidence of recurrence. Continue arimidex for now. Stable.   # Elevated tumor marker CA 27-29-  Jan 2018- Stable.   # Osteoporosis surveillance-she is on fosomax/ vit D;  bone density May 2017 osteopenia- Stable.     # HTN- elevated BP- recommend checking at home; and bring a log at next visit. Continue current medications- Stable. .   # IV access: port flush every 2 months-   # DISPOSITION: # rituxan today.  # as per IS  # in 1 week-MD: NO labs- rituxan # follow up in 2 weeks-MD; labs-port/ cbc/bmp; rituximab- Dr.B          Earna Coder, MD 09/26/2022 9:51 AM

## 2022-09-26 ENCOUNTER — Other Ambulatory Visit: Payer: Medicare Other

## 2022-09-26 ENCOUNTER — Inpatient Hospital Stay (HOSPITAL_BASED_OUTPATIENT_CLINIC_OR_DEPARTMENT_OTHER): Payer: Medicare Other | Admitting: Internal Medicine

## 2022-09-26 ENCOUNTER — Inpatient Hospital Stay: Payer: Medicare Other

## 2022-09-26 ENCOUNTER — Ambulatory Visit: Payer: Medicare Other

## 2022-09-26 ENCOUNTER — Encounter: Payer: Self-pay | Admitting: Internal Medicine

## 2022-09-26 VITALS — BP 174/62 | HR 78 | Temp 97.5°F | Ht 60.0 in | Wt 125.2 lb

## 2022-09-26 DIAGNOSIS — Z171 Estrogen receptor negative status [ER-]: Secondary | ICD-10-CM | POA: Diagnosis not present

## 2022-09-26 DIAGNOSIS — C884 Extranodal marginal zone B-cell lymphoma of mucosa-associated lymphoid tissue [MALT-lymphoma]: Secondary | ICD-10-CM | POA: Diagnosis not present

## 2022-09-26 DIAGNOSIS — Z7982 Long term (current) use of aspirin: Secondary | ICD-10-CM | POA: Diagnosis not present

## 2022-09-26 DIAGNOSIS — C50812 Malignant neoplasm of overlapping sites of left female breast: Secondary | ICD-10-CM | POA: Diagnosis not present

## 2022-09-26 DIAGNOSIS — Z79811 Long term (current) use of aromatase inhibitors: Secondary | ICD-10-CM | POA: Diagnosis not present

## 2022-09-26 DIAGNOSIS — Z5111 Encounter for antineoplastic chemotherapy: Secondary | ICD-10-CM | POA: Diagnosis not present

## 2022-09-26 DIAGNOSIS — Z79899 Other long term (current) drug therapy: Secondary | ICD-10-CM | POA: Diagnosis not present

## 2022-09-26 MED ORDER — ONDANSETRON HCL 8 MG PO TABS: ORAL_TABLET | ORAL | 1 refills | Status: DC

## 2022-09-26 MED ORDER — ACETAMINOPHEN 325 MG PO TABS
650.0000 mg | ORAL_TABLET | Freq: Once | ORAL | Status: AC
  Administered 2022-09-26: 650 mg via ORAL
  Filled 2022-09-26: qty 2

## 2022-09-26 MED ORDER — SODIUM CHLORIDE 0.9 % IV SOLN
Freq: Once | INTRAVENOUS | Status: AC
  Filled 2022-09-26: qty 250

## 2022-09-26 MED ORDER — SODIUM CHLORIDE 0.9 % IV SOLN
375.0000 mg/m2 | Freq: Once | INTRAVENOUS | Status: AC
  Administered 2022-09-26: 600 mg via INTRAVENOUS
  Filled 2022-09-26: qty 50

## 2022-09-26 MED ORDER — ONDANSETRON HCL 8 MG PO TABS
8.0000 mg | ORAL_TABLET | Freq: Once | ORAL | Status: AC
  Administered 2022-09-26: 8 mg via ORAL
  Filled 2022-09-26: qty 1

## 2022-09-26 MED ORDER — DIPHENHYDRAMINE HCL 25 MG PO CAPS
50.0000 mg | ORAL_CAPSULE | Freq: Once | ORAL | Status: AC
  Administered 2022-09-26: 50 mg via ORAL
  Filled 2022-09-26: qty 2

## 2022-09-26 NOTE — Progress Notes (Signed)
No questions or concerns today 

## 2022-09-26 NOTE — Patient Instructions (Signed)
[place]  Discharge Instructions: Thank you for choosing [facility] to provide your oncology and hematology care.  If you have a lab appointment with the [facility], please go directly to the [facility] and check in at the registration area.  Wear comfortable clothing and clothing appropriate for easy access to any Portacath or PICC line.   We strive to give you quality time with your provider. You may need to reschedule your appointment if you arrive late (15 or more minutes).  Arriving late affects you and other patients whose appointments are after yours.  Also, if you miss three or more appointments without notifying the office, you may be dismissed from the clinic at the provider's discretion.      For prescription refill requests, have your pharmacy contact our office and allow 72 hours for refills to be completed.    Today you received the following chemotherapy and/or immunotherapy agents Ruxience      To help prevent nausea and vomiting after your treatment, we encourage you to take your nausea medication as directed.  BELOW ARE SYMPTOMS THAT SHOULD BE REPORTED IMMEDIATELY: *FEVER GREATER THAN 100.4 F (38 C) OR HIGHER *CHILLS OR SWEATING *NAUSEA AND VOMITING THAT IS NOT CONTROLLED WITH YOUR NAUSEA MEDICATION *UNUSUAL SHORTNESS OF BREATH *UNUSUAL BRUISING OR BLEEDING *URINARY PROBLEMS (pain or burning when urinating, or frequent urination) *BOWEL PROBLEMS (unusual diarrhea, constipation, pain near the anus) TENDERNESS IN MOUTH AND THROAT WITH OR WITHOUT PRESENCE OF ULCERS (sore throat, sores in mouth, or a toothache) UNUSUAL RASH, SWELLING OR PAIN  UNUSUAL VAGINAL DISCHARGE OR ITCHING   Items with * indicate a potential emergency and should be followed up as soon as possible or go to the Emergency Department if any problems should occur.  Please show the CHEMOTHERAPY ALERT CARD or IMMUNOTHERAPY ALERT CARD at check-in to  the Emergency Department and triage nurse.  Should you have questions after your visit or need to cancel or reschedule your appointment, please contact [facility]  [phone] and follow the prompts.  Office hours are 8:00 a.m. to 4:30 p.m. Monday - Friday. Please note that voicemails left after 4:00 p.m. may not be returned until the following business day.  We are closed weekends and major holidays. You have access to a nurse at all times for urgent questions. Please call the main number to the clinic [phone] and follow the prompts.  For any non-urgent questions, you may also contact your provider using MyChart. We now offer e-Visits for anyone 63 and older to request care online for non-urgent symptoms. For details visit [email or web address].   Also download the MyChart app! Go to the app store, search "MyChart", open the app, select [place], and log in with your MyChart username and password.

## 2022-10-02 ENCOUNTER — Encounter: Payer: Self-pay | Admitting: Internal Medicine

## 2022-10-03 ENCOUNTER — Ambulatory Visit
Admission: RE | Admit: 2022-10-03 | Discharge: 2022-10-03 | Disposition: A | Discharge status: Home / Self Care | Payer: Medicare Other | Attending: Internal Medicine | Admitting: Internal Medicine

## 2022-10-03 ENCOUNTER — Inpatient Hospital Stay (HOSPITAL_BASED_OUTPATIENT_CLINIC_OR_DEPARTMENT_OTHER): Payer: Medicare Other | Admitting: Internal Medicine

## 2022-10-03 ENCOUNTER — Ambulatory Visit: Payer: Medicare Other

## 2022-10-03 ENCOUNTER — Encounter: Payer: Self-pay | Admitting: Internal Medicine

## 2022-10-03 ENCOUNTER — Other Ambulatory Visit: Payer: Self-pay | Admitting: *Deleted

## 2022-10-03 ENCOUNTER — Inpatient Hospital Stay: Payer: Medicare Other

## 2022-10-03 ENCOUNTER — Ambulatory Visit
Admission: RE | Admit: 2022-10-03 | Discharge: 2022-10-03 | Disposition: A | Discharge status: Home / Self Care | Payer: Medicare Other | Source: Ambulatory Visit | Attending: Internal Medicine | Admitting: Internal Medicine

## 2022-10-03 VITALS — BP 138/73 | HR 78 | Temp 96.9°F | Resp 18

## 2022-10-03 DIAGNOSIS — C50812 Malignant neoplasm of overlapping sites of left female breast: Secondary | ICD-10-CM | POA: Diagnosis not present

## 2022-10-03 DIAGNOSIS — Z79899 Other long term (current) drug therapy: Secondary | ICD-10-CM

## 2022-10-03 DIAGNOSIS — Z7982 Long term (current) use of aspirin: Secondary | ICD-10-CM | POA: Diagnosis not present

## 2022-10-03 DIAGNOSIS — C884 Extranodal marginal zone B-cell lymphoma of mucosa-associated lymphoid tissue [MALT-lymphoma]: Secondary | ICD-10-CM

## 2022-10-03 DIAGNOSIS — Z79811 Long term (current) use of aromatase inhibitors: Secondary | ICD-10-CM | POA: Diagnosis not present

## 2022-10-03 DIAGNOSIS — Z171 Estrogen receptor negative status [ER-]: Secondary | ICD-10-CM

## 2022-10-03 DIAGNOSIS — Z5111 Encounter for antineoplastic chemotherapy: Secondary | ICD-10-CM | POA: Diagnosis not present

## 2022-10-03 DIAGNOSIS — Z95810 Presence of automatic (implantable) cardiac defibrillator: Secondary | ICD-10-CM | POA: Diagnosis not present

## 2022-10-03 DIAGNOSIS — Z95828 Presence of other vascular implants and grafts: Secondary | ICD-10-CM | POA: Diagnosis not present

## 2022-10-03 MED ORDER — ACETAMINOPHEN 325 MG PO TABS
650.0000 mg | ORAL_TABLET | Freq: Once | ORAL | Status: AC
  Administered 2022-10-03: 650 mg via ORAL
  Filled 2022-10-03: qty 2

## 2022-10-03 MED ORDER — SODIUM CHLORIDE 0.9 % IV SOLN
Freq: Once | INTRAVENOUS | Status: AC
  Filled 2022-10-03: qty 250

## 2022-10-03 MED ORDER — DIPHENHYDRAMINE HCL 25 MG PO CAPS
50.0000 mg | ORAL_CAPSULE | Freq: Once | ORAL | Status: AC
  Administered 2022-10-03: 50 mg via ORAL
  Filled 2022-10-03: qty 2

## 2022-10-03 MED ORDER — SODIUM CHLORIDE 0.9 % IV SOLN
375.0000 mg/m2 | Freq: Once | INTRAVENOUS | Status: AC
  Administered 2022-10-03: 600 mg via INTRAVENOUS
  Filled 2022-10-03: qty 50

## 2022-10-03 MED ORDER — ONDANSETRON HCL 8 MG PO TABS
8.0000 mg | ORAL_TABLET | Freq: Once | ORAL | Status: AC
  Administered 2022-10-03: 8 mg via ORAL
  Filled 2022-10-03: qty 1

## 2022-10-03 NOTE — Patient Instructions (Signed)
[place]  Discharge Instructions: Thank you for choosing [facility] to provide your oncology and hematology care.  If you have a lab appointment with the [facility], please go directly to the [facility] and check in at the registration area.  Wear comfortable clothing and clothing appropriate for easy access to any Portacath or PICC line.   We strive to give you quality time with your provider. You may need to reschedule your appointment if you arrive late (15 or more minutes).  Arriving late affects you and other patients whose appointments are after yours.  Also, if you miss three or more appointments without notifying the office, you may be dismissed from the clinic at the provider's discretion.      For prescription refill requests, have your pharmacy contact our office and allow 72 hours for refills to be completed.    Today you received the following chemotherapy and/or immunotherapy agents Rituximab      To help prevent nausea and vomiting after your treatment, we encourage you to take your nausea medication as directed.  BELOW ARE SYMPTOMS THAT SHOULD BE REPORTED IMMEDIATELY: *FEVER GREATER THAN 100.4 F (38 C) OR HIGHER *CHILLS OR SWEATING *NAUSEA AND VOMITING THAT IS NOT CONTROLLED WITH YOUR NAUSEA MEDICATION *UNUSUAL SHORTNESS OF BREATH *UNUSUAL BRUISING OR BLEEDING *URINARY PROBLEMS (pain or burning when urinating, or frequent urination) *BOWEL PROBLEMS (unusual diarrhea, constipation, pain near the anus) TENDERNESS IN MOUTH AND THROAT WITH OR WITHOUT PRESENCE OF ULCERS (sore throat, sores in mouth, or a toothache) UNUSUAL RASH, SWELLING OR PAIN  UNUSUAL VAGINAL DISCHARGE OR ITCHING   Items with * indicate a potential emergency and should be followed up as soon as possible or go to the Emergency Department if any problems should occur.  Please show the CHEMOTHERAPY ALERT CARD or IMMUNOTHERAPY ALERT CARD at check-in to  the Emergency Department and triage nurse.  Should you have questions after your visit or need to cancel or reschedule your appointment, please contact [facility]  [phone] and follow the prompts.  Office hours are 8:00 a.m. to 4:30 p.m. Monday - Friday. Please note that voicemails left after 4:00 p.m. may not be returned until the following business day.  We are closed weekends and major holidays. You have access to a nurse at all times for urgent questions. Please call the main number to the clinic [phone] and follow the prompts.  For any non-urgent questions, you may also contact your provider using MyChart. We now offer e-Visits for anyone 15 and older to request care online for non-urgent symptoms. For details visit [email or web address].   Also download the MyChart app! Go to the app store, search "MyChart", open the app, select [facility], and log in with your MyChart username and password.

## 2022-10-03 NOTE — Assessment & Plan Note (Signed)
##   Right upper lobe/middle lobe- MALT [NO RT] -  S/p evaluation with Dr.A- and Bronch- MAY 31st, 2024-- POSITIVE FOR MALIGNANCY; CONSISTENT WITH INVOLVEMENT BY PATIENT'S KNOWN MALT / MARGINAL ZONE LYMPHOMA.  JULY 17th- PET- Chronic irregular patchy geographic regions of consolidation and nodular thickening of the peribronchovascular interstitium throughout the mid to upper right lung with associated patchy mild hypermetabolism and marked varicoid bronchiectasis, overall mildly progressive since 03/02/2021 PET-CT, including a new hypermetabolic 3.0 x 2.5 cm masslike focus of consolidation in the medial aspect of the superior segment right lower lobe. Favor mild progression of pulmonary lymphoma. Otherwise no evidence of hypermetabolic metastatic disease. JULY 2024- Hepatitis-work up-NEG. JULy 2024- Rituximab weekly x 4.  Discussed the goal of treatment is not curative but to control disease.    # Proceed with rituximab #3 of planned 4 today. Labs-CBC/chemistries were reviewed with the patient.   # Nausea- G-1- recommend anti-emetics prn. Add prophylactic- Zofran PO.   # 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 Clinically no evidence of recurrence. Continue arimidex for now. Stable.   # Elevated tumor marker CA 27-29-  Jan 2018- Stable.   # Osteoporosis surveillance-she is on fosomax/ vit D;  bone density May 2017 osteopenia- Stable.   # HTN- elevated BP- recommend checking at home; and bring a log at next visit. Continue current medications- Stable.   # IV access: port flush every 2 months-   # DISPOSITION: # rituxan today.  # as per IS  # follow up in 1 week-MD; labs-port/ cbc/bmp; rituximab- Dr.B

## 2022-10-03 NOTE — Progress Notes (Signed)
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 STABLE band like opacity in RUL/RML MAY 2017 CT-NED;   June 2024- Dr.Aleskerov- A. LYMPH NODE, 10R; EBUS-ASSISTED FNA:  - POSITIVE FOR MALIGNANCY.  - CONSISTENT WITH INVOLVEMENT BY PATIENT'S KNOWN MALT / MARGINAL ZONE  LYMPHOMA.   Specimen is adequate for interpretation.   Comment:  Given the patient's history of MALT lymphoma CD20 and CD43 stains were  performed. Co-expression of CD20 and CD43 is present and supports the  above diagnosis.   # JULY 17th- PET- IMPRESSION: 1. Chronic irregular patchy geographic regions of consolidation and nodular thickening of the peribronchovascular interstitium throughout the mid to upper right lung with associated patchy mild hypermetabolism and marked varicoid bronchiectasis, overall mildly progressive since 03/02/2021 PET-CT, including a new hypermetabolic 3.0 x 2.5 cm masslike focus of consolidation in the medial aspect of the superior segment right lower lobe. Favor mild progression of pulmonary lymphoma. Otherwise no evidence of hypermetabolic metastatic disease. JULY 2024- Hepatitis-work up-NEG.   # MVH84ON 2016- MUGA 61 %; DEC 22nd 2016- 65%  # LEFT UE LYMPHEDEMA s/p PT ----------------------------------------------------     DIAGNOSIS: LEFT BREAST CA  STAGE:  II ;GOALS: curative  CURRENT/MOST RECENT THERAPY: arimidex    Mucosa-associated lymphoid tissue (MALT) lymphoma (HCC)  04/21/2015 Initial Diagnosis   Mucosa-associated lymphoid tissue (MALT) lymphoma (HCC)   09/06/2022 Cancer Staging   Staging form: Hodgkin and Non-Hodgkin Lymphoma, AJCC 7th Edition - Clinical: Stage I (E - Extranodal, A - Asymptomatic) - Signed by Earna Coder, MD on 09/06/2022 Source of metastatic specimen: Lung Immune suppression conditions: None   09/20/2022 -  Chemotherapy   Patient is on Treatment Plan : NON-HODGKINS LYMPHOMA Rituximab q7d     Carcinoma of overlapping sites of left breast in female, estrogen receptor negative (HCC)  09/23/2015 Initial Diagnosis   Cancer of overlapping sites of left female breast (HCC)    INTERVAL HISTORY: Ambulating independently.  Accompanied by daughter.  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  Currently, s/p Rituxan- s/p 2 infusion  last week.   Denies any nausea or vomiting. No worsening cough or shortness of breath.  No fevers or chills.  Other wise, Continues to be asymptomatic. No new lumps or bumps.  Review of Systems  Constitutional:  Positive for malaise/fatigue. Negative for chills, diaphoresis and fever.  HENT:  Negative for nosebleeds and sore throat.   Eyes:  Negative for double vision.  Respiratory:  Negative for cough, hemoptysis, sputum production, shortness of breath and wheezing.   Cardiovascular:  Negative for chest pain, palpitations, orthopnea and leg swelling.  Gastrointestinal:  Negative for abdominal pain, blood in stool, constipation, diarrhea, heartburn, melena, nausea and vomiting.  Genitourinary:  Negative for dysuria, frequency  and urgency.  Musculoskeletal:  Positive for back pain and joint  pain.  Skin: Negative.  Negative for itching and rash.  Neurological:  Negative for dizziness, tingling, focal weakness, weakness and headaches.  Endo/Heme/Allergies:  Does not bruise/bleed easily.  Psychiatric/Behavioral:  Negative for depression. The patient is not nervous/anxious and does not have insomnia.      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 2 cm axillary node. One of 11 nodes positive on post-adjuvant chemotherapy axillary dissection. 3+ centimeter tumor.   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   MALT lymphoma (HCC)    Murmur    Osteoporosis    Personal history of chemotherapy     PAST SURGICAL HISTORY :   Past Surgical History:  Procedure Laterality Date   ABDOMINAL HYSTERECTOMY     APPENDECTOMY     BREAST SURGERY Left 02/25/14   Modified radical mastectomy, T2 N1. Grade 3, ER, PR negative, HER-2/neu 3+.   COLONOSCOPY WITH PROPOFOL N/A 01/26/2016   Procedure: COLONOSCOPY WITH PROPOFOL;  Surgeon: Earline Mayotte, MD;  Location: [facility] ENDOSCOPY;  Service: Endoscopy;  Laterality: N/A;   MASTECTOMY Left 02-25-14   Dr Lemar Livings   VIDEO BRONCHOSCOPY WITH ENDOBRONCHIAL ULTRASOUND N/A 07/14/2022   Procedure: VIDEO BRONCHOSCOPY WITH ENDOBRONCHIAL ULTRASOUND;  Surgeon: Vida Rigger, MD;  Location: [facility] ORS;  Service: Thoracic;  Laterality: N/A;    FAMILY HISTORY :   Family History  Problem Relation Age of Onset   Hypertension Mother    Hypertension Maternal Aunt    Hypertension Maternal Uncle    Hypertension Maternal Aunt    Hypertension Maternal Aunt    Breast cancer Neg Hx     SOCIAL HISTORY:   Social History   Tobacco Use   Smoking status: Never   Smokeless tobacco: Never  Vaping Use   Vaping status: Never  Used  Substance Use Topics   Alcohol use: No   Drug use: No    ALLERGIES:  has No Known Allergies.  MEDICATIONS:  Current Outpatient Medications  Medication Sig Dispense Refill   alendronate (FOSAMAX) 70 MG tablet Take 70 mg by mouth once a week. Take with a full glass of water on an empty stomach.  Wednesdays     amLODipine (NORVASC) 2.5 MG tablet Take 2.5 mg by mouth every morning.     anastrozole (ARIMIDEX) 1 MG tablet TAKE 1 TABLET BY MOUTH EVERY DAY (Patient taking differently: Take 1 mg by mouth every morning.) 90 tablet 3   aspirin 81 MG tablet Take 81 mg by mouth daily.     cholecalciferol (VITAMIN D) 1000 UNITS tablet Take 1,000 Units by mouth daily.     lidocaine-prilocaine (EMLA) cream Apply 1 application. topically as needed. Apply to port and cover with saran wrap 1-2 hours prior to port access 30 g 4   losartan-hydrochlorothiazide (HYZAAR) 100-25 MG per tablet Take 1 tablet by mouth every morning.     Multiple Vitamins-Minerals (MULTIVITAMIN WITH MINERALS) tablet Take 1 tablet by mouth daily.     ondansetron (ZOFRAN) 8 MG tablet One pill every 8 hours as needed for nausea/vomitting. 40 tablet 1   potassium chloride SA (KLOR-CON M) 20 MEQ tablet Take 20 mEq by mouth every morning.  simvastatin (ZOCOR) 40 MG tablet Take 40 mg by mouth every morning.     No current facility-administered medications for this visit.   Facility-Administered Medications Ordered in Other Visits  Medication Dose Route Frequency Provider Last Rate Last Admin   heparin lock flush 100 UNIT/ML injection            sodium chloride flush (NS) 0.9 % injection 10 mL  10 mL Intravenous PRN Earna Coder, MD   10 mL at 10/19/16 1054   sodium chloride flush (NS) 0.9 % injection 10 mL  10 mL Intravenous PRN Earna Coder, MD   10 mL at 12/13/17 1050    PHYSICAL EXAMINATION: ECOG PERFORMANCE STATUS: 0 - Asymptomatic  BP (!) 159/64 (BP Location: Right Arm, Patient Position: Sitting, Cuff  Size: Normal)   Pulse 79   Temp 97.8 F (36.6 C) (Tympanic)   Ht 5' (1.524 m)   Wt 125 lb (56.7 kg)   SpO2 98%   BMI 24.41 kg/m   Filed Weights   10/03/22 1041  Weight: 125 lb (56.7 kg)      Physical Exam Constitutional:      Comments: Frail-appearing female patient.  She is walking herself.  Alone.  HENT:     Head: Normocephalic and atraumatic.     Mouth/Throat:     Pharynx: No oropharyngeal exudate.  Eyes:     Pupils: Pupils are equal, round, and reactive to light.  Cardiovascular:     Rate and Rhythm: Normal rate and regular rhythm.  Pulmonary:     Effort: No respiratory distress.     Breath sounds: No wheezing.  Abdominal:     General: Bowel sounds are normal. There is no distension.     Palpations: Abdomen is soft. There is no mass.     Tenderness: There is no abdominal tenderness. There is no guarding or rebound.  Musculoskeletal:        General: No tenderness. Normal range of motion.     Cervical back: Normal range of motion and neck supple.  Skin:    General: Skin is warm.  Neurological:     Mental Status: She is alert and oriented to person, place, and time.  Psychiatric:        Mood and Affect: Affect normal.      LABORATORY DATA:  I have reviewed the data as listed    Component Value Date/Time   NA 137 09/20/2022 0817   NA 136 05/27/2014 0942   K 3.5 09/20/2022 0817   K 3.4 (L) 05/27/2014 0942   CL 104 09/20/2022 0817   CL 102 05/27/2014 0942   CO2 25 09/20/2022 0817   CO2 29 05/27/2014 0942   GLUCOSE 104 (H) 09/20/2022 0817   GLUCOSE 106 (H) 05/27/2014 0942   BUN 15 09/20/2022 0817   BUN 13 05/27/2014 0942   CREATININE 0.67 09/20/2022 0817   CREATININE 0.58 05/27/2014 0942   CALCIUM 8.6 (L) 09/20/2022 0817   CALCIUM 8.6 (L) 05/27/2014 0942   PROT 7.4 09/20/2022 0817   PROT 6.8 05/27/2014 0942   ALBUMIN 4.1 09/20/2022 0817   ALBUMIN 3.8 05/27/2014 0942   AST 24 09/20/2022 0817   ALT 15 09/20/2022 0817   ALT 14 05/27/2014 0942    ALKPHOS 64 09/20/2022 0817   ALKPHOS 69 05/27/2014 0942   BILITOT 0.4 09/20/2022 0817   GFRNONAA >60 09/20/2022 0817   GFRNONAA >60 05/27/2014 0942   GFRAA >60 10/22/2019 1457   GFRAA >60 05/27/2014 1610  No results found for: "SPEP", "UPEP"  Lab Results  Component Value Date   WBC 4.1 09/20/2022   NEUTROABS 1.8 09/20/2022   HGB 11.3 (L) 09/20/2022   HCT 35.1 (L) 09/20/2022   MCV 91.6 09/20/2022   PLT 210 09/20/2022      Chemistry      Component Value Date/Time   NA 137 09/20/2022 0817   NA 136 05/27/2014 0942   K 3.5 09/20/2022 0817   K 3.4 (L) 05/27/2014 0942   CL 104 09/20/2022 0817   CL 102 05/27/2014 0942   CO2 25 09/20/2022 0817   CO2 29 05/27/2014 0942   BUN 15 09/20/2022 0817   BUN 13 05/27/2014 0942   CREATININE 0.67 09/20/2022 0817   CREATININE 0.58 05/27/2014 0942      Component Value Date/Time   CALCIUM 8.6 (L) 09/20/2022 0817   CALCIUM 8.6 (L) 05/27/2014 0942   ALKPHOS 64 09/20/2022 0817   ALKPHOS 69 05/27/2014 0942   AST 24 09/20/2022 0817   ALT 15 09/20/2022 0817   ALT 14 05/27/2014 0942   BILITOT 0.4 09/20/2022 0817       ASSESSMENT & PLAN:   Mucosa-associated lymphoid tissue (MALT) lymphoma (HCC) ## Right upper lobe/middle lobe- MALT [NO RT] -  S/p evaluation with Dr.A- and Bronch- MAY 31st, 2024-- POSITIVE FOR MALIGNANCY; CONSISTENT WITH INVOLVEMENT BY PATIENT'S KNOWN MALT / MARGINAL ZONE LYMPHOMA.  JULY 17th- PET- Chronic irregular patchy geographic regions of consolidation and nodular thickening of the peribronchovascular interstitium throughout the mid to upper right lung with associated patchy mild hypermetabolism and marked varicoid bronchiectasis, overall mildly progressive since 03/02/2021 PET-CT, including a new hypermetabolic 3.0 x 2.5 cm masslike focus of consolidation in the medial aspect of the superior segment right lower lobe. Favor mild progression of pulmonary lymphoma. Otherwise no evidence of hypermetabolic metastatic disease.  JULY 2024- Hepatitis-work up-NEG. JULy 2024- Rituximab weekly x 4.  Discussed the goal of treatment is not curative but to control disease.    # Proceed with rituximab #3 of planned 4 today. Labs-CBC/chemistries were reviewed with the patient.   # Nausea- G-1- recommend anti-emetics prn. Add prophylactic- Zofran PO.   # 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 Clinically no evidence of recurrence. Continue arimidex for now. Stable.   # Elevated tumor marker CA 27-29-  Jan 2018- Stable.   # Osteoporosis surveillance-she is on fosomax/ vit D;  bone density May 2017 osteopenia- Stable.   # HTN- elevated BP- recommend checking at home; and bring a log at next visit. Continue current medications- Stable.   # IV access: port flush every 2 months-   # DISPOSITION: # rituxan today.  # as per IS  # follow up in 1 week-MD; labs-port/ cbc/bmp; rituximab- Dr.B        Earna Coder, MD 10/03/2022 11:08 AM

## 2022-10-03 NOTE — Progress Notes (Unsigned)
Attempted to access port x2 without success. Unable to palpate reservoir. Concerned port has flipped. MD/ team notified and discussed with IR. Per IR, they would like to obtain chest x-ray to determine next course of action. Pt and daughter agreeable  to CXR today.

## 2022-10-03 NOTE — Progress Notes (Signed)
No questions or concerns today 

## 2022-10-04 ENCOUNTER — Telehealth: Payer: Self-pay | Admitting: *Deleted

## 2022-10-04 ENCOUNTER — Encounter: Payer: Self-pay | Admitting: Internal Medicine

## 2022-10-04 NOTE — Telephone Encounter (Signed)
Spoke with patient's daughter regarding the need for Port dye study. I confirmed with Marylu Lund in IR. Pt can come to [facility] at 2:30 pm tomorrow for the dye  study.

## 2022-10-05 ENCOUNTER — Ambulatory Visit
Admission: RE | Admit: 2022-10-05 | Discharge: 2022-10-05 | Disposition: A | Discharge status: Home / Self Care | Payer: Medicare Other | Source: Ambulatory Visit | Attending: Internal Medicine | Admitting: Internal Medicine

## 2022-10-05 DIAGNOSIS — T85618A Breakdown (mechanical) of other specified internal prosthetic devices, implants and grafts, initial encounter: Secondary | ICD-10-CM | POA: Insufficient documentation

## 2022-10-05 DIAGNOSIS — Z95828 Presence of other vascular implants and grafts: Secondary | ICD-10-CM

## 2022-10-05 DIAGNOSIS — T82598A Other mechanical complication of other cardiac and vascular devices and implants, initial encounter: Secondary | ICD-10-CM | POA: Diagnosis not present

## 2022-10-05 HISTORY — PX: IR CV LINE INJECTION: IMG2294

## 2022-10-05 MED ORDER — HEPARIN SOD (PORK) LOCK FLUSH 100 UNIT/ML IV SOLN
500.0000 [IU] | Freq: Once | INTRAVENOUS | Status: AC
  Administered 2022-10-05: 500 [IU] via INTRAVENOUS

## 2022-10-05 MED ORDER — HEPARIN SOD (PORK) LOCK FLUSH 100 UNIT/ML IV SOLN
INTRAVENOUS | Status: AC
  Filled 2022-10-05: qty 5

## 2022-10-05 MED ORDER — IOHEXOL 300 MG/ML  SOLN
10.0000 mL | Freq: Once | INTRAMUSCULAR | Status: AC | PRN
  Administered 2022-10-05: 10 mL via INTRAVENOUS

## 2022-10-06 ENCOUNTER — Other Ambulatory Visit: Payer: Self-pay

## 2022-10-09 ENCOUNTER — Other Ambulatory Visit: Payer: Self-pay

## 2022-10-10 ENCOUNTER — Encounter: Payer: Self-pay | Admitting: Internal Medicine

## 2022-10-10 ENCOUNTER — Inpatient Hospital Stay: Payer: Medicare Other

## 2022-10-10 ENCOUNTER — Inpatient Hospital Stay (HOSPITAL_BASED_OUTPATIENT_CLINIC_OR_DEPARTMENT_OTHER): Payer: Medicare Other | Admitting: Internal Medicine

## 2022-10-10 ENCOUNTER — Ambulatory Visit: Payer: Medicare Other

## 2022-10-10 DIAGNOSIS — C884 Extranodal marginal zone B-cell lymphoma of mucosa-associated lymphoid tissue [MALT-lymphoma]: Secondary | ICD-10-CM

## 2022-10-10 DIAGNOSIS — C50812 Malignant neoplasm of overlapping sites of left female breast: Secondary | ICD-10-CM | POA: Diagnosis not present

## 2022-10-10 DIAGNOSIS — Z79811 Long term (current) use of aromatase inhibitors: Secondary | ICD-10-CM | POA: Diagnosis not present

## 2022-10-10 DIAGNOSIS — Z7982 Long term (current) use of aspirin: Secondary | ICD-10-CM | POA: Diagnosis not present

## 2022-10-10 DIAGNOSIS — Z5111 Encounter for antineoplastic chemotherapy: Secondary | ICD-10-CM | POA: Diagnosis not present

## 2022-10-10 DIAGNOSIS — Z79899 Other long term (current) drug therapy: Secondary | ICD-10-CM | POA: Diagnosis not present

## 2022-10-10 DIAGNOSIS — Z171 Estrogen receptor negative status [ER-]: Secondary | ICD-10-CM | POA: Diagnosis not present

## 2022-10-10 LAB — CBC WITH DIFFERENTIAL (CANCER CENTER ONLY)
Abs Immature Granulocytes: 0.01 10*3/uL (ref 0.00–0.07)
Basophils Absolute: 0.1 10*3/uL (ref 0.0–0.1)
Basophils Relative: 1 %
Eosinophils Absolute: 0.1 10*3/uL (ref 0.0–0.5)
Eosinophils Relative: 2 %
HCT: 36.4 % (ref 36.0–46.0)
Hemoglobin: 11.7 g/dL — ABNORMAL LOW (ref 12.0–15.0)
Immature Granulocytes: 0 %
Lymphocytes Relative: 34 %
Lymphs Abs: 1.7 10*3/uL (ref 0.7–4.0)
MCH: 29.5 pg (ref 26.0–34.0)
MCHC: 32.1 g/dL (ref 30.0–36.0)
MCV: 91.9 fL (ref 80.0–100.0)
Monocytes Absolute: 1 10*3/uL (ref 0.1–1.0)
Monocytes Relative: 21 %
Neutro Abs: 2.1 10*3/uL (ref 1.7–7.7)
Neutrophils Relative %: 42 %
Platelet Count: 247 10*3/uL (ref 150–400)
RBC: 3.96 MIL/uL (ref 3.87–5.11)
RDW: 13.1 % (ref 11.5–15.5)
WBC Count: 5 10*3/uL (ref 4.0–10.5)
nRBC: 0 % (ref 0.0–0.2)

## 2022-10-10 LAB — BASIC METABOLIC PANEL - CANCER CENTER ONLY
Anion gap: 7 (ref 5–15)
BUN: 14 mg/dL (ref 8–23)
CO2: 27 mmol/L (ref 22–32)
Calcium: 8.8 mg/dL — ABNORMAL LOW (ref 8.9–10.3)
Chloride: 103 mmol/L (ref 98–111)
Creatinine: 0.63 mg/dL (ref 0.44–1.00)
GFR, Estimated: >60 mL/min (ref >60)
Glucose, Bld: 115 mg/dL — ABNORMAL HIGH (ref 70–99)
Potassium: 3.3 mmol/L — ABNORMAL LOW (ref 3.5–5.1)
Sodium: 137 mmol/L (ref 135–145)

## 2022-10-10 MED ORDER — HEPARIN SOD (PORK) LOCK FLUSH 100 UNIT/ML IV SOLN
500.0000 [IU] | Freq: Once | INTRAVENOUS | Status: AC | PRN
  Administered 2022-10-10: 500 [IU]
  Filled 2022-10-10: qty 5

## 2022-10-10 MED ORDER — SODIUM CHLORIDE 0.9 % IV SOLN
Freq: Once | INTRAVENOUS | Status: AC
  Filled 2022-10-10: qty 250

## 2022-10-10 MED ORDER — ACETAMINOPHEN 325 MG PO TABS: 650.0000 mg | ORAL_TABLET | Freq: Once | ORAL | Status: DC

## 2022-10-10 MED ORDER — DIPHENHYDRAMINE HCL 25 MG PO CAPS: 50.0000 mg | ORAL_CAPSULE | Freq: Once | ORAL | Status: DC

## 2022-10-10 MED ORDER — SODIUM CHLORIDE 0.9 % IV SOLN: 375.0000 mg/m2 | Freq: Once | INTRAVENOUS | Status: DC

## 2022-10-10 MED ORDER — ONDANSETRON HCL 8 MG PO TABS
8.0000 mg | ORAL_TABLET | Freq: Once | ORAL | Status: DC
  Filled 2022-10-10: qty 1

## 2022-10-10 NOTE — Progress Notes (Signed)
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 STABLE band like opacity in RUL/RML MAY 2017 CT-NED;   June 2024- Dr.Aleskerov- A. LYMPH NODE, 10R; EBUS-ASSISTED FNA:  - POSITIVE FOR MALIGNANCY.  - CONSISTENT WITH INVOLVEMENT BY PATIENT'S KNOWN MALT / MARGINAL ZONE  LYMPHOMA.   Specimen is adequate for interpretation.   Comment:  Given the patient's history of MALT lymphoma CD20 and CD43 stains were  performed. Co-expression of CD20 and CD43 is present and supports the  above diagnosis.   # JULY 17th- PET- IMPRESSION: 1. Chronic irregular patchy geographic regions of consolidation and nodular thickening of the peribronchovascular interstitium throughout the mid to upper right lung with associated patchy mild hypermetabolism and marked varicoid bronchiectasis, overall mildly progressive since 03/02/2021 PET-CT, including a new hypermetabolic 3.0 x 2.5 cm masslike focus of consolidation in the medial aspect of the superior segment right lower lobe. Favor mild progression of pulmonary lymphoma. Otherwise no evidence of hypermetabolic metastatic disease. JULY 2024- Hepatitis-work up-NEG.   # ZOX09UE 2016- MUGA 61 %; DEC 22nd 2016- 65%  # LEFT UE LYMPHEDEMA s/p PT ----------------------------------------------------     DIAGNOSIS: LEFT BREAST CA  STAGE:  II ;GOALS: curative  CURRENT/MOST RECENT THERAPY: arimidex    Mucosa-associated lymphoid tissue (MALT) lymphoma (HCC)  04/21/2015 Initial Diagnosis   Mucosa-associated lymphoid tissue (MALT) lymphoma (HCC)   09/06/2022 Cancer Staging   Staging form: Hodgkin and Non-Hodgkin Lymphoma, AJCC 7th Edition - Clinical: Stage I (E - Extranodal, A - Asymptomatic) - Signed by Earna Coder, MD on 09/06/2022 Source of metastatic specimen: Lung Immune suppression conditions: None   09/20/2022 -  Chemotherapy   Patient is on Treatment Plan : NON-HODGKINS LYMPHOMA Rituximab q7d     Carcinoma of overlapping sites of left breast in female, estrogen receptor negative (HCC)  09/23/2015 Initial Diagnosis   Cancer of overlapping sites of left female breast (HCC)    INTERVAL HISTORY: Ambulating independently.  Accompanied by daughter.  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  Currently, s/p Rituxan- s/p 3 infusion  last week.   Denies any nausea or vomiting. No worsening cough or shortness of breath.  No fevers or chills. Other wise, Continues to be asymptomatic. No new lumps or bumps.  Review of Systems  Constitutional:  Positive for malaise/fatigue. Negative for chills, diaphoresis and fever.  HENT:  Negative for nosebleeds and sore throat.   Eyes:  Negative for double vision.  Respiratory:  Negative for cough, hemoptysis, sputum production, shortness of breath and wheezing.   Cardiovascular:  Negative for chest pain, palpitations, orthopnea and leg swelling.  Gastrointestinal:  Negative for abdominal pain, blood in stool, constipation, diarrhea, heartburn, melena, nausea and vomiting.  Genitourinary:  Negative for dysuria, frequency and  urgency.  Musculoskeletal:  Positive for back pain and joint  pain.  Skin: Negative.  Negative for itching and rash.  Neurological:  Negative for dizziness, tingling, focal weakness, weakness and headaches.  Endo/Heme/Allergies:  Does not bruise/bleed easily.  Psychiatric/Behavioral:  Negative for depression. The patient is not nervous/anxious and does not have insomnia.      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 2 cm axillary node. One of 11 nodes positive on post-adjuvant chemotherapy axillary dissection. 3+ centimeter tumor.   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   MALT lymphoma (HCC)    Murmur    Osteoporosis    Personal history of chemotherapy     PAST SURGICAL HISTORY :   Past Surgical History:  Procedure Laterality Date   ABDOMINAL HYSTERECTOMY     APPENDECTOMY     BREAST SURGERY Left 02/25/14   Modified radical mastectomy, T2 N1. Grade 3, ER, PR negative, HER-2/neu 3+.   COLONOSCOPY WITH PROPOFOL N/A 01/26/2016   Procedure: COLONOSCOPY WITH PROPOFOL;  Surgeon: Earline Mayotte, MD;  Location: [facility] ENDOSCOPY;  Service: Endoscopy;  Laterality: N/A;   IR CV LINE INJECTION  10/05/2022   MASTECTOMY Left 02-25-14   Dr Lemar Livings   VIDEO BRONCHOSCOPY WITH ENDOBRONCHIAL ULTRASOUND N/A 07/14/2022   Procedure: VIDEO BRONCHOSCOPY WITH ENDOBRONCHIAL ULTRASOUND;  Surgeon: Vida Rigger, MD;  Location: [facility] ORS;  Service: Thoracic;  Laterality: N/A;    FAMILY HISTORY :   Family History  Problem Relation Age of Onset   Hypertension Mother    Hypertension Maternal Aunt    Hypertension Maternal Uncle    Hypertension Maternal Aunt    Hypertension Maternal Aunt    Breast cancer Neg Hx     SOCIAL HISTORY:   Social History   Tobacco Use   Smoking status: Never   Smokeless tobacco: Never   Vaping Use   Vaping status: Never Used  Substance Use Topics   Alcohol use: No   Drug use: No    ALLERGIES:  has No Known Allergies.  MEDICATIONS:  Current Outpatient Medications  Medication Sig Dispense Refill   alendronate (FOSAMAX) 70 MG tablet Take 70 mg by mouth once a week. Take with a full glass of water on an empty stomach.  Wednesdays     amLODipine (NORVASC) 2.5 MG tablet Take 2.5 mg by mouth every morning.     anastrozole (ARIMIDEX) 1 MG tablet TAKE 1 TABLET BY MOUTH EVERY DAY (Patient taking differently: Take 1 mg by mouth every morning.) 90 tablet 3   aspirin 81 MG tablet Take 81 mg by mouth daily.     cholecalciferol (VITAMIN D) 1000 UNITS tablet Take 1,000 Units by mouth daily.     lidocaine-prilocaine (EMLA) cream Apply 1 application. topically as needed. Apply to port and cover with saran wrap 1-2 hours prior to port access 30 g 4   losartan-hydrochlorothiazide (HYZAAR) 100-25 MG per tablet Take 1 tablet by mouth every morning.     Multiple Vitamins-Minerals (MULTIVITAMIN WITH MINERALS) tablet Take 1 tablet by mouth daily.     ondansetron (ZOFRAN) 8 MG tablet One pill every 8 hours as needed for nausea/vomitting. 40 tablet 1   potassium chloride SA (KLOR-CON M) 20 MEQ tablet Take 20  mEq by mouth every morning.     simvastatin (ZOCOR) 40 MG tablet Take 40 mg by mouth every morning.     No current facility-administered medications for this visit.   Facility-Administered Medications Ordered in Other Visits  Medication Dose Route Frequency Provider Last Rate Last Admin   heparin lock flush 100 UNIT/ML injection            sodium chloride flush (NS) 0.9 % injection 10 mL  10 mL Intravenous PRN Earna Coder, MD   10 mL at 10/19/16 1054   sodium chloride flush (NS) 0.9 % injection 10 mL  10 mL Intravenous PRN Earna Coder, MD   10 mL at 12/13/17 1050    PHYSICAL EXAMINATION: ECOG PERFORMANCE STATUS: 0 - Asymptomatic  BP (!) 161/76 (BP Location: Right  Arm, Patient Position: Sitting, Cuff Size: Normal)   Pulse 80   Temp (!) 97.2 F (36.2 C) (Tympanic)   Ht 5' (1.524 m)   Wt 126 lb (57.2 kg)   SpO2 98%   BMI 24.61 kg/m   Filed Weights   10/10/22 0843  Weight: 126 lb (57.2 kg)      Physical Exam Constitutional:      Comments: Frail-appearing female patient.  She is walking herself.  Alone.  HENT:     Head: Normocephalic and atraumatic.     Mouth/Throat:     Pharynx: No oropharyngeal exudate.  Eyes:     Pupils: Pupils are equal, round, and reactive to light.  Cardiovascular:     Rate and Rhythm: Normal rate and regular rhythm.  Pulmonary:     Effort: No respiratory distress.     Breath sounds: No wheezing.  Abdominal:     General: Bowel sounds are normal. There is no distension.     Palpations: Abdomen is soft. There is no mass.     Tenderness: There is no abdominal tenderness. There is no guarding or rebound.  Musculoskeletal:        General: No tenderness. Normal range of motion.     Cervical back: Normal range of motion and neck supple.  Skin:    General: Skin is warm.  Neurological:     Mental Status: She is alert and oriented to person, place, and time.  Psychiatric:        Mood and Affect: Affect normal.      LABORATORY DATA:  I have reviewed the data as listed    Component Value Date/Time   NA 137 10/10/2022 0839   NA 136 05/27/2014 0942   K 3.3 (L) 10/10/2022 0839   K 3.4 (L) 05/27/2014 0942   CL 103 10/10/2022 0839   CL 102 05/27/2014 0942   CO2 27 10/10/2022 0839   CO2 29 05/27/2014 0942   GLUCOSE 115 (H) 10/10/2022 0839   GLUCOSE 106 (H) 05/27/2014 0942   BUN 14 10/10/2022 0839   BUN 13 05/27/2014 0942   CREATININE 0.63 10/10/2022 0839   CREATININE 0.58 05/27/2014 0942   CALCIUM 8.8 (L) 10/10/2022 0839   CALCIUM 8.6 (L) 05/27/2014 0942   PROT 7.4 09/20/2022 0817   PROT 6.8 05/27/2014 0942   ALBUMIN 4.1 09/20/2022 0817   ALBUMIN 3.8 05/27/2014 0942   AST 24 09/20/2022 0817   ALT 15  09/20/2022 0817   ALT 14 05/27/2014 0942   ALKPHOS 64 09/20/2022 0817   ALKPHOS 69 05/27/2014 0942   BILITOT 0.4 09/20/2022 0817   GFRNONAA >60 10/10/2022 0839   GFRNONAA >60 05/27/2014 7253  GFRAA >60 10/22/2019 1457   GFRAA >60 05/27/2014 0942    No results found for: "SPEP", "UPEP"  Lab Results  Component Value Date   WBC 5.0 10/10/2022   NEUTROABS 2.1 10/10/2022   HGB 11.7 (L) 10/10/2022   HCT 36.4 10/10/2022   MCV 91.9 10/10/2022   PLT 247 10/10/2022      Chemistry      Component Value Date/Time   NA 137 10/10/2022 0839   NA 136 05/27/2014 0942   K 3.3 (L) 10/10/2022 0839   K 3.4 (L) 05/27/2014 0942   CL 103 10/10/2022 0839   CL 102 05/27/2014 0942   CO2 27 10/10/2022 0839   CO2 29 05/27/2014 0942   BUN 14 10/10/2022 0839   BUN 13 05/27/2014 0942   CREATININE 0.63 10/10/2022 0839   CREATININE 0.58 05/27/2014 0942      Component Value Date/Time   CALCIUM 8.8 (L) 10/10/2022 0839   CALCIUM 8.6 (L) 05/27/2014 0942   ALKPHOS 64 09/20/2022 0817   ALKPHOS 69 05/27/2014 0942   AST 24 09/20/2022 0817   ALT 15 09/20/2022 0817   ALT 14 05/27/2014 0942   BILITOT 0.4 09/20/2022 0817       ASSESSMENT & PLAN:   Mucosa-associated lymphoid tissue (MALT) lymphoma (HCC) ## Right upper lobe/middle lobe- MALT [NO RT] -  S/p evaluation with Dr.A- and Bronch- MAY 31st, 2024-- POSITIVE FOR MALIGNANCY; CONSISTENT WITH INVOLVEMENT BY PATIENT'S KNOWN MALT / MARGINAL ZONE LYMPHOMA.  JULY 17th- PET- Chronic irregular patchy geographic regions of consolidation and nodular thickening of the peribronchovascular interstitium throughout the mid to upper right lung with associated patchy mild hypermetabolism and marked varicoid bronchiectasis, overall mildly progressive since 03/02/2021 PET-CT, including a new hypermetabolic 3.0 x 2.5 cm masslike focus of consolidation in the medial aspect of the superior segment right lower lobe. Favor mild progression of pulmonary lymphoma. Otherwise no  evidence of hypermetabolic metastatic disease. JULY 2024- Hepatitis-work up-NEG. JULy 2024- Rituximab weekly x 4.  Discussed the goal of treatment is not curative but to control disease.    # Proceed with rituximab #4 of planned 4 today. Labs-CBC/chemistries were reviewed with the patient. Will plan PET scan in 3 months- ordered today.  Again reviewed the prognosis-unfortunately incurable goal is to control the disease.  However due to the slow smoldering malignancy.  Hopefully rituximab is helping to help put disease in remission.  # Nausea- G-1- recommend anti-emetics prn. Add prophylactic- Zofran PO.  Stable.   # 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 Clinically no evidence of recurrence. Continue arimidex for now.  Stable.   # Chronic Elevated tumor marker CA 27-29-    Stable.   # Osteoporosis surveillance-she is on fosomax/ vit D;  bone density May 2017 osteopenia- Stable.   # HTN- elevated BP- recommend checking at home; and bring a log at next visit. Continue current medications- Stable.   # IV access: port flush every 2 months-   *37m # DISPOSITION: # rituxan today.    # follow up in 1st week of dec 2024--MD; labs-port/ cbc/cmp;PET prior-- Dr.B      Earna Coder, MD 10/10/2022 9:30 AM

## 2022-10-10 NOTE — Progress Notes (Signed)
Unable to give treatment today due to pharmacy not having Ruxience available. Patient, patient's daughter and MD/ MD team aware. Per pharmacy Ruxience will be available from offsite, pt rescheduled to 10/11/2022 at 0930 for treatment.

## 2022-10-10 NOTE — Progress Notes (Signed)
No questions or concerns today 

## 2022-10-10 NOTE — Assessment & Plan Note (Addendum)
##   Right upper lobe/middle lobe- MALT [NO RT] -  S/p evaluation with Dr.A- and Bronch- MAY 31st, 2024-- POSITIVE FOR MALIGNANCY; CONSISTENT WITH INVOLVEMENT BY PATIENT'S KNOWN MALT / MARGINAL ZONE LYMPHOMA.  JULY 17th- PET- Chronic irregular patchy geographic regions of consolidation and nodular thickening of the peribronchovascular interstitium throughout the mid to upper right lung with associated patchy mild hypermetabolism and marked varicoid bronchiectasis, overall mildly progressive since 03/02/2021 PET-CT, including a new hypermetabolic 3.0 x 2.5 cm masslike focus of consolidation in the medial aspect of the superior segment right lower lobe. Favor mild progression of pulmonary lymphoma. Otherwise no evidence of hypermetabolic metastatic disease. JULY 2024- Hepatitis-work up-NEG. JULy 2024- Rituximab weekly x 4.  Discussed the goal of treatment is not curative but to control disease.    # Proceed with rituximab #4 of planned 4 today. Labs-CBC/chemistries were reviewed with the patient. Will plan PET scan in 3 months- ordered today.  Again reviewed the prognosis-unfortunately incurable goal is to control the disease.  However due to the slow smoldering malignancy.  Hopefully rituximab is helping to help put disease in remission.  # Nausea- G-1- recommend anti-emetics prn. Add prophylactic- Zofran PO.  Stable.   # 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 Clinically no evidence of recurrence. Continue arimidex for now.  Stable.   # Chronic Elevated tumor marker CA 27-29-    Stable.   # Osteoporosis surveillance-she is on fosomax/ vit D;  bone density May 2017 osteopenia- Stable.   # HTN- elevated BP- recommend checking at home; and bring a log at next visit. Continue current medications- Stable.   # IV access: port flush every 2 months-   *38m # DISPOSITION: # rituxan today.    # follow up in 1st week of dec  2024--MD; labs-port/ cbc/cmp;PET prior-- Dr.B

## 2022-10-10 NOTE — Progress Notes (Signed)
Pharmacy does not have Ruxience in stock today.  Pt and her dtr prefer to return on 8/28 for tx rather than waiting for drug to arrive from offsite.  Tx plan deferred.  MD aware.  Ebony Hail, Pharm.D., CPP 10/10/2022@10 :03 AM

## 2022-10-11 ENCOUNTER — Other Ambulatory Visit: Payer: Self-pay

## 2022-10-11 ENCOUNTER — Inpatient Hospital Stay: Payer: Medicare Other

## 2022-10-11 VITALS — BP 150/62 | HR 79 | Temp 98.3°F | Resp 18

## 2022-10-11 DIAGNOSIS — Z79899 Other long term (current) drug therapy: Secondary | ICD-10-CM | POA: Diagnosis not present

## 2022-10-11 DIAGNOSIS — Z7982 Long term (current) use of aspirin: Secondary | ICD-10-CM | POA: Diagnosis not present

## 2022-10-11 DIAGNOSIS — Z79811 Long term (current) use of aromatase inhibitors: Secondary | ICD-10-CM | POA: Diagnosis not present

## 2022-10-11 DIAGNOSIS — Z171 Estrogen receptor negative status [ER-]: Secondary | ICD-10-CM | POA: Diagnosis not present

## 2022-10-11 DIAGNOSIS — C50812 Malignant neoplasm of overlapping sites of left female breast: Secondary | ICD-10-CM | POA: Diagnosis not present

## 2022-10-11 DIAGNOSIS — C884 Extranodal marginal zone B-cell lymphoma of mucosa-associated lymphoid tissue [MALT-lymphoma]: Secondary | ICD-10-CM

## 2022-10-11 DIAGNOSIS — Z5111 Encounter for antineoplastic chemotherapy: Secondary | ICD-10-CM | POA: Diagnosis not present

## 2022-10-11 MED ORDER — SODIUM CHLORIDE 0.9 % IV SOLN
Freq: Once | INTRAVENOUS | Status: AC
  Filled 2022-10-11: qty 250

## 2022-10-11 MED ORDER — ACETAMINOPHEN 325 MG PO TABS
650.0000 mg | ORAL_TABLET | Freq: Once | ORAL | Status: AC
  Administered 2022-10-11: 650 mg via ORAL
  Filled 2022-10-11: qty 2

## 2022-10-11 MED ORDER — SODIUM CHLORIDE 0.9 % IV SOLN
375.0000 mg/m2 | Freq: Once | INTRAVENOUS | Status: AC
  Administered 2022-10-11: 600 mg via INTRAVENOUS
  Filled 2022-10-11: qty 50

## 2022-10-11 MED ORDER — ONDANSETRON HCL 8 MG PO TABS
8.0000 mg | ORAL_TABLET | Freq: Once | ORAL | Status: AC
  Administered 2022-10-11: 8 mg via ORAL
  Filled 2022-10-11: qty 1

## 2022-10-11 MED ORDER — HEPARIN SOD (PORK) LOCK FLUSH 100 UNIT/ML IV SOLN
500.0000 [IU] | Freq: Once | INTRAVENOUS | Status: AC | PRN
  Administered 2022-10-11: 500 [IU]
  Filled 2022-10-11: qty 5

## 2022-10-11 MED ORDER — DIPHENHYDRAMINE HCL 25 MG PO CAPS
50.0000 mg | ORAL_CAPSULE | Freq: Once | ORAL | Status: AC
  Administered 2022-10-11: 50 mg via ORAL
  Filled 2022-10-11: qty 2

## 2022-10-11 NOTE — Patient Instructions (Signed)
[place]  Discharge Instructions: Thank you for choosing [facility] to provide your oncology and hematology care.  If you have a lab appointment with the [facility], please go directly to the [facility] and check in at the registration area.  Wear comfortable clothing and clothing appropriate for easy access to any Portacath or PICC line.   We strive to give you quality time with your provider. You may need to reschedule your appointment if you arrive late (15 or more minutes).  Arriving late affects you and other patients whose appointments are after yours.  Also, if you miss three or more appointments without notifying the office, you may be dismissed from the clinic at the provider's discretion.      For prescription refill requests, have your pharmacy contact our office and allow 72 hours for refills to be completed.    Today you received the following chemotherapy and/or immunotherapy agents Rituximab      To help prevent nausea and vomiting after your treatment, we encourage you to take your nausea medication as directed.  BELOW ARE SYMPTOMS THAT SHOULD BE REPORTED IMMEDIATELY: *FEVER GREATER THAN 100.4 F (38 C) OR HIGHER *CHILLS OR SWEATING *NAUSEA AND VOMITING THAT IS NOT CONTROLLED WITH YOUR NAUSEA MEDICATION *UNUSUAL SHORTNESS OF BREATH *UNUSUAL BRUISING OR BLEEDING *URINARY PROBLEMS (pain or burning when urinating, or frequent urination) *BOWEL PROBLEMS (unusual diarrhea, constipation, pain near the anus) TENDERNESS IN MOUTH AND THROAT WITH OR WITHOUT PRESENCE OF ULCERS (sore throat, sores in mouth, or a toothache) UNUSUAL RASH, SWELLING OR PAIN  UNUSUAL VAGINAL DISCHARGE OR ITCHING   Items with * indicate a potential emergency and should be followed up as soon as possible or go to the Emergency Department if any problems should occur.  Please show the CHEMOTHERAPY ALERT CARD or IMMUNOTHERAPY ALERT CARD at check-in to  the Emergency Department and triage nurse.  Should you have questions after your visit or need to cancel or reschedule your appointment, please contact [facility]  [phone] and follow the prompts.  Office hours are 8:00 a.m. to 4:30 p.m. Monday - Friday. Please note that voicemails left after 4:00 p.m. may not be returned until the following business day.  We are closed weekends and major holidays. You have access to a nurse at all times for urgent questions. Please call the main number to the clinic [phone] and follow the prompts.  For any non-urgent questions, you may also contact your provider using MyChart. We now offer e-Visits for anyone 15 and older to request care online for non-urgent symptoms. For details visit [email or web address].   Also download the MyChart app! Go to the app store, search "MyChart", open the app, select [facility], and log in with your MyChart username and password.

## 2022-11-07 DIAGNOSIS — Z23 Encounter for immunization: Secondary | ICD-10-CM | POA: Diagnosis not present

## 2022-12-05 ENCOUNTER — Other Ambulatory Visit: Payer: Self-pay

## 2022-12-15 DIAGNOSIS — E785 Hyperlipidemia, unspecified: Secondary | ICD-10-CM | POA: Diagnosis not present

## 2022-12-19 DIAGNOSIS — I10 Essential (primary) hypertension: Secondary | ICD-10-CM | POA: Diagnosis not present

## 2022-12-19 DIAGNOSIS — E785 Hyperlipidemia, unspecified: Secondary | ICD-10-CM | POA: Diagnosis not present

## 2022-12-26 ENCOUNTER — Other Ambulatory Visit: Payer: Self-pay | Admitting: Internal Medicine

## 2022-12-26 DIAGNOSIS — K080 Exfoliation of teeth due to systemic causes: Secondary | ICD-10-CM | POA: Diagnosis not present

## 2022-12-26 DIAGNOSIS — Z1231 Encounter for screening mammogram for malignant neoplasm of breast: Secondary | ICD-10-CM

## 2022-12-28 ENCOUNTER — Other Ambulatory Visit: Payer: Self-pay

## 2023-01-10 ENCOUNTER — Ambulatory Visit
Admission: RE | Admit: 2023-01-10 | Discharge: 2023-01-10 | Disposition: A | Discharge status: Home / Self Care | Payer: Medicare Other | Source: Ambulatory Visit | Attending: Internal Medicine | Admitting: Internal Medicine

## 2023-01-10 DIAGNOSIS — Z853 Personal history of malignant neoplasm of breast: Secondary | ICD-10-CM | POA: Diagnosis not present

## 2023-01-10 DIAGNOSIS — Z79620 Long term (current) use of immunosuppressive biologic: Secondary | ICD-10-CM | POA: Insufficient documentation

## 2023-01-10 DIAGNOSIS — C8580 Other specified types of non-Hodgkin lymphoma, unspecified site: Secondary | ICD-10-CM | POA: Diagnosis not present

## 2023-01-10 DIAGNOSIS — C884 Extranodal marginal zone b-cell lymphoma of mucosa-associated lymphoid tissue (malt-lymphoma) not having achieved remission: Secondary | ICD-10-CM | POA: Diagnosis not present

## 2023-01-10 DIAGNOSIS — R918 Other nonspecific abnormal finding of lung field: Secondary | ICD-10-CM | POA: Diagnosis not present

## 2023-01-10 LAB — GLUCOSE, CAPILLARY: Glucose-Capillary: 90 mg/dL (ref 70–99)

## 2023-01-10 MED ORDER — FLUDEOXYGLUCOSE F - 18 (FDG) INJECTION
6.5000 | Freq: Once | INTRAVENOUS | Status: AC | PRN
  Administered 2023-01-10: 7.14 via INTRAVENOUS

## 2023-01-15 ENCOUNTER — Inpatient Hospital Stay: Payer: Medicare Other | Attending: Internal Medicine | Admitting: Internal Medicine

## 2023-01-15 ENCOUNTER — Encounter: Payer: Self-pay | Admitting: Internal Medicine

## 2023-01-15 ENCOUNTER — Inpatient Hospital Stay: Payer: Medicare Other

## 2023-01-15 DIAGNOSIS — J479 Bronchiectasis, uncomplicated: Secondary | ICD-10-CM

## 2023-01-15 DIAGNOSIS — Z79899 Other long term (current) drug therapy: Secondary | ICD-10-CM | POA: Diagnosis not present

## 2023-01-15 DIAGNOSIS — Z171 Estrogen receptor negative status [ER-]: Secondary | ICD-10-CM

## 2023-01-15 DIAGNOSIS — Z95828 Presence of other vascular implants and grafts: Secondary | ICD-10-CM

## 2023-01-15 DIAGNOSIS — C50812 Malignant neoplasm of overlapping sites of left female breast: Secondary | ICD-10-CM

## 2023-01-15 DIAGNOSIS — C884 Extranodal marginal zone b-cell lymphoma of mucosa-associated lymphoid tissue (malt-lymphoma) not having achieved remission: Secondary | ICD-10-CM | POA: Diagnosis not present

## 2023-01-15 DIAGNOSIS — Z79811 Long term (current) use of aromatase inhibitors: Secondary | ICD-10-CM | POA: Diagnosis not present

## 2023-01-15 LAB — CMP (CANCER CENTER ONLY)
ALT: 15 U/L (ref 0–44)
AST: 18 U/L (ref 15–41)
Albumin: 4.1 g/dL (ref 3.5–5.0)
Alkaline Phosphatase: 66 U/L (ref 38–126)
Anion gap: 12 (ref 5–15)
BUN: 14 mg/dL (ref 8–23)
CO2: 27 mmol/L (ref 22–32)
Calcium: 9 mg/dL (ref 8.9–10.3)
Chloride: 101 mmol/L (ref 98–111)
Creatinine: 0.58 mg/dL (ref 0.44–1.00)
GFR, Estimated: >60 mL/min (ref >60)
Glucose, Bld: 100 mg/dL — ABNORMAL HIGH (ref 70–99)
Potassium: 3.5 mmol/L (ref 3.5–5.1)
Sodium: 140 mmol/L (ref 135–145)
Total Bilirubin: 0.6 mg/dL (ref <1.2)
Total Protein: 7.8 g/dL (ref 6.5–8.1)

## 2023-01-15 LAB — CBC WITH DIFFERENTIAL (CANCER CENTER ONLY)
Abs Immature Granulocytes: 0.01 10*3/uL (ref 0.00–0.07)
Basophils Absolute: 0 10*3/uL (ref 0.0–0.1)
Basophils Relative: 0 %
Eosinophils Absolute: 0.1 10*3/uL (ref 0.0–0.5)
Eosinophils Relative: 1 %
HCT: 37.4 % (ref 36.0–46.0)
Hemoglobin: 12.5 g/dL (ref 12.0–15.0)
Immature Granulocytes: 0 %
Lymphocytes Relative: 31 %
Lymphs Abs: 1.6 10*3/uL (ref 0.7–4.0)
MCH: 30.6 pg (ref 26.0–34.0)
MCHC: 33.4 g/dL (ref 30.0–36.0)
MCV: 91.7 fL (ref 80.0–100.0)
Monocytes Absolute: 1.3 10*3/uL — ABNORMAL HIGH (ref 0.1–1.0)
Monocytes Relative: 25 %
Neutro Abs: 2.2 10*3/uL (ref 1.7–7.7)
Neutrophils Relative %: 43 %
Platelet Count: 234 10*3/uL (ref 150–400)
RBC: 4.08 MIL/uL (ref 3.87–5.11)
RDW: 13.2 % (ref 11.5–15.5)
WBC Count: 5.1 10*3/uL (ref 4.0–10.5)
nRBC: 0 % (ref 0.0–0.2)

## 2023-01-15 MED ORDER — HEPARIN SOD (PORK) LOCK FLUSH 100 UNIT/ML IV SOLN
500.0000 [IU] | Freq: Once | INTRAVENOUS | Status: AC
  Administered 2023-01-15: 500 [IU] via INTRAVENOUS
  Filled 2023-01-15: qty 5

## 2023-01-15 MED ORDER — SODIUM CHLORIDE 0.9% FLUSH
10.0000 mL | Freq: Once | INTRAVENOUS | Status: AC
  Administered 2023-01-15: 10 mL via INTRAVENOUS
  Filled 2023-01-15: qty 10

## 2023-01-15 NOTE — Progress Notes (Signed)
PET 01/10/23.

## 2023-01-15 NOTE — Progress Notes (Signed)
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 STABLE band like opacity in RUL/RML MAY 2017 CT-NED;   June 2024- Dr.Aleskerov- A. LYMPH NODE, 10R; EBUS-ASSISTED FNA:  - POSITIVE FOR MALIGNANCY.  - CONSISTENT WITH INVOLVEMENT BY PATIENT'S KNOWN MALT / MARGINAL ZONE  LYMPHOMA.   Specimen is adequate for interpretation.   Comment:  Given the patient's history of MALT lymphoma CD20 and CD43 stains were  performed. Co-expression of CD20 and CD43 is present and supports the  above diagnosis.   # JULY 17th- PET- IMPRESSION: 1. Chronic irregular patchy geographic regions of consolidation and nodular thickening of the peribronchovascular interstitium throughout the mid to upper right lung with associated patchy mild hypermetabolism and marked varicoid bronchiectasis, overall mildly progressive since 03/02/2021 PET-CT, including a new hypermetabolic 3.0 x 2.5 cm masslike focus of consolidation in the medial aspect of the superior segment right lower lobe. Favor mild progression of pulmonary lymphoma. Otherwise no evidence of hypermetabolic metastatic disease. JULY 2024- Hepatitis-work up-NEG.   # AUG 2024- s/p rituxan weekly x4- NOV 2024 PET significant improvement of lymphoma of the lung.  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      Mucosa-associated lymphoid tissue (MALT) lymphoma  04/21/2015 Initial Diagnosis   Mucosa-associated lymphoid tissue (MALT) lymphoma (HCC)   09/06/2022 Cancer Staging   Staging form: Hodgkin and Non-Hodgkin Lymphoma, AJCC 7th Edition - Clinical: Stage I (E - Extranodal, A - Asymptomatic) - Signed by Earna Coder, MD on 09/06/2022 Source of metastatic specimen: Lung Immune suppression conditions: None   09/20/2022 -  Chemotherapy   Patient is on Treatment Plan : NON-HODGKINS LYMPHOMA Rituximab q7d     Carcinoma of overlapping sites of left breast in female, estrogen receptor negative (HCC)  09/23/2015 Initial Diagnosis   Cancer of overlapping sites of left female breast (HCC)    INTERVAL HISTORY: Ambulating independently.  Accompanied by daughter.  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   Currently, s/p Rituxan- s/p 4 infusion  last week appx 3 months ago.   Denies any nausea or vomiting. No worsening cough or shortness of breath.  No fevers or chills. Other wise, Continues to be asymptomatic. No new lumps or bumps.  Review of Systems  Constitutional:  Positive for malaise/fatigue. Negative for chills, diaphoresis and fever.  HENT:  Negative for nosebleeds and sore throat.   Eyes:  Negative for double vision.  Respiratory:  Negative for cough, hemoptysis, sputum production, shortness of breath and wheezing.   Cardiovascular:  Negative for chest pain, palpitations,  orthopnea and leg swelling.  Gastrointestinal:  Negative for abdominal pain, blood in stool, constipation, diarrhea, heartburn, melena,  nausea and vomiting.  Genitourinary:  Negative for dysuria, frequency and urgency.  Musculoskeletal:  Positive for back pain and joint pain.  Skin: Negative.  Negative for itching and rash.  Neurological:  Negative for dizziness, tingling, focal weakness, weakness and headaches.  Endo/Heme/Allergies:  Does not bruise/bleed easily.  Psychiatric/Behavioral:  Negative for depression. The patient is not nervous/anxious and does not have insomnia.      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 2 cm axillary node. One of 11 nodes positive on post-adjuvant chemotherapy axillary dissection. 3+ centimeter tumor.   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   MALT lymphoma    Murmur    Osteoporosis    Personal history of chemotherapy     PAST SURGICAL HISTORY :   Past Surgical History:  Procedure Laterality Date   ABDOMINAL HYSTERECTOMY     APPENDECTOMY     BREAST SURGERY Left 02/25/14   Modified radical mastectomy, T2 N1. Grade 3, ER, PR negative, HER-2/neu 3+.   COLONOSCOPY WITH PROPOFOL N/A 01/26/2016   Procedure: COLONOSCOPY WITH PROPOFOL;  Surgeon: Earline Mayotte, MD;  Location: [facility] ENDOSCOPY;  Service: Endoscopy;  Laterality: N/A;   IR CV LINE INJECTION  10/05/2022   MASTECTOMY Left 02-25-14   Dr Lemar Livings   VIDEO BRONCHOSCOPY WITH ENDOBRONCHIAL ULTRASOUND N/A 07/14/2022   Procedure: VIDEO BRONCHOSCOPY WITH ENDOBRONCHIAL ULTRASOUND;  Surgeon: Vida Rigger, MD;  Location: [facility] ORS;  Service: Thoracic;  Laterality: N/A;    FAMILY HISTORY :   Family History  Problem Relation Age of Onset   Hypertension Mother    Hypertension Maternal Aunt    Hypertension Maternal Uncle    Hypertension Maternal Aunt    Hypertension Maternal Aunt     Breast cancer Neg Hx     SOCIAL HISTORY:   Social History   Tobacco Use   Smoking status: Never   Smokeless tobacco: Never  Vaping Use   Vaping status: Never Used  Substance Use Topics   Alcohol use: No   Drug use: No    ALLERGIES:  has No Known Allergies.  MEDICATIONS:  Current Outpatient Medications  Medication Sig Dispense Refill   alendronate (FOSAMAX) 70 MG tablet Take 70 mg by mouth once a week. Take with a full glass of water on an empty stomach.  Wednesdays     amLODipine (NORVASC) 2.5 MG tablet Take 2.5 mg by mouth every morning.     anastrozole (ARIMIDEX) 1 MG tablet TAKE 1 TABLET BY MOUTH EVERY DAY (Patient taking differently: Take 1 mg by mouth every morning.) 90 tablet 3   aspirin 81 MG tablet Take 81 mg by mouth daily.     cholecalciferol (VITAMIN D) 1000 UNITS tablet Take 1,000 Units by mouth daily.     lidocaine-prilocaine (EMLA) cream Apply 1 application. topically as needed. Apply to port and cover with saran wrap 1-2 hours prior to port access 30 g 4   losartan-hydrochlorothiazide (HYZAAR) 100-25 MG per tablet Take 1 tablet by mouth every morning.     Multiple Vitamins-Minerals (MULTIVITAMIN WITH MINERALS) tablet Take 1 tablet by mouth daily.     potassium  chloride SA (KLOR-CON M) 20 MEQ tablet Take 20 mEq by mouth every morning.     simvastatin (ZOCOR) 40 MG tablet Take 40 mg by mouth every morning.     ondansetron (ZOFRAN) 8 MG tablet One pill every 8 hours as needed for nausea/vomitting. (Patient not taking: Reported on 01/15/2023) 40 tablet 1   No current facility-administered medications for this visit.   Facility-Administered Medications Ordered in Other Visits  Medication Dose Route Frequency Provider Last Rate Last Admin   heparin lock flush 100 UNIT/ML injection            sodium chloride flush (NS) 0.9 % injection 10 mL  10 mL Intravenous PRN Earna Coder, MD   10 mL at 10/19/16 1054   sodium chloride flush (NS) 0.9 % injection 10 mL  10 mL  Intravenous PRN Earna Coder, MD   10 mL at 12/13/17 1050    PHYSICAL EXAMINATION: ECOG PERFORMANCE STATUS: 0 - Asymptomatic  BP (!) 178/74 (BP Location: Right Arm, Patient Position: Sitting, Cuff Size: Normal)   Pulse 87   Temp 97.9 F (36.6 C) (Tympanic)   Ht 4\' 11"  (1.499 m)   Wt 132 lb 3.2 oz (60 kg)   SpO2 100%   BMI 26.70 kg/m   Filed Weights   01/15/23 1435  Weight: 132 lb 3.2 oz (60 kg)       Physical Exam Constitutional:      Comments: Frail-appearing female patient.  She is walking herself.  Alone.  HENT:     Head: Normocephalic and atraumatic.     Mouth/Throat:     Pharynx: No oropharyngeal exudate.  Eyes:     Pupils: Pupils are equal, round, and reactive to light.  Cardiovascular:     Rate and Rhythm: Normal rate and regular rhythm.  Pulmonary:     Effort: No respiratory distress.     Breath sounds: No wheezing.  Abdominal:     General: Bowel sounds are normal. There is no distension.     Palpations: Abdomen is soft. There is no mass.     Tenderness: There is no abdominal tenderness. There is no guarding or rebound.  Musculoskeletal:        General: No tenderness. Normal range of motion.     Cervical back: Normal range of motion and neck supple.  Skin:    General: Skin is warm.  Neurological:     Mental Status: She is alert and oriented to person, place, and time.  Psychiatric:        Mood and Affect: Affect normal.      LABORATORY DATA:  I have reviewed the data as listed    Component Value Date/Time   NA 140 01/15/2023 1423   NA 136 05/27/2014 0942   K 3.5 01/15/2023 1423   K 3.4 (L) 05/27/2014 0942   CL 101 01/15/2023 1423   CL 102 05/27/2014 0942   CO2 27 01/15/2023 1423   CO2 29 05/27/2014 0942   GLUCOSE 100 (H) 01/15/2023 1423   GLUCOSE 106 (H) 05/27/2014 0942   BUN 14 01/15/2023 1423   BUN 13 05/27/2014 0942   CREATININE 0.58 01/15/2023 1423   CREATININE 0.58 05/27/2014 0942   CALCIUM 9.0 01/15/2023 1423   CALCIUM  8.6 (L) 05/27/2014 0942   PROT 7.8 01/15/2023 1423   PROT 6.8 05/27/2014 0942   ALBUMIN 4.1 01/15/2023 1423   ALBUMIN 3.8 05/27/2014 0942   AST 18 01/15/2023 1423   ALT 15 01/15/2023 1423  ALT 14 05/27/2014 0942   ALKPHOS 66 01/15/2023 1423   ALKPHOS 69 05/27/2014 0942   BILITOT 0.6 01/15/2023 1423   GFRNONAA >60 01/15/2023 1423   GFRNONAA >60 05/27/2014 0942   GFRAA >60 10/22/2019 1457   GFRAA >60 05/27/2014 0942    No results found for: "SPEP", "UPEP"  Lab Results  Component Value Date   WBC 5.1 01/15/2023   NEUTROABS 2.2 01/15/2023   HGB 12.5 01/15/2023   HCT 37.4 01/15/2023   MCV 91.7 01/15/2023   PLT 234 01/15/2023      Chemistry      Component Value Date/Time   NA 140 01/15/2023 1423   NA 136 05/27/2014 0942   K 3.5 01/15/2023 1423   K 3.4 (L) 05/27/2014 0942   CL 101 01/15/2023 1423   CL 102 05/27/2014 0942   CO2 27 01/15/2023 1423   CO2 29 05/27/2014 0942   BUN 14 01/15/2023 1423   BUN 13 05/27/2014 0942   CREATININE 0.58 01/15/2023 1423   CREATININE 0.58 05/27/2014 0942      Component Value Date/Time   CALCIUM 9.0 01/15/2023 1423   CALCIUM 8.6 (L) 05/27/2014 0942   ALKPHOS 66 01/15/2023 1423   ALKPHOS 69 05/27/2014 0942   AST 18 01/15/2023 1423   ALT 15 01/15/2023 1423   ALT 14 05/27/2014 0942   BILITOT 0.6 01/15/2023 1423       ASSESSMENT & PLAN:   Mucosa-associated lymphoid tissue (MALT) lymphoma (HCC) ## Right upper lobe/middle lobe- MALT [NO RT] -  S/p evaluation with Dr.A- and Bronch- MAY 31st, 2024-- POSITIVE FOR MALIGNANCY; CONSISTENT WITH INVOLVEMENT BY PATIENT'S KNOWN MALT / MARGINAL ZONE LYMPHOMA.  JULY 17th- PET- Chronic irregular patchy geographic regions of consolidation and nodular thickening of the peribronchovascular interstitium throughout the mid to upper right lung with associated patchy mild hypermetabolism and marked varicoid bronchiectasis, overall mildly progressive since 03/02/2021 PET-CT, including a new hypermetabolic  3.0 x 2.5 cm masslike focus of consolidation in the medial aspect of the superior segment right lower lobe. Favor mild progression of pulmonary lymphoma. Otherwise no evidence of hypermetabolic metastatic disease. JULY 2024- Hepatitis-work up-NEG. JULy 2024- Rituximab weekly x 4.   # PET scan NOV 2024- Marked improvement in consolidative infiltrative pattern in the RIGHT upper lobe. No focal hypermetabolic activity remains in the RIGHT lung above background blood pool activity; No evidence of recurrent lymphoma in the chest; No evidence of metastatic lymphoma or breast cancer in the neck, abdomen, pelvis, or skeleton. Will repeat imaging in 6 months. Will order at netx visit.   # Nausea- G-1- recommend anti-emetics prn. Add prophylactic- Zofran PO.  Stable.   # 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 Clinically no evidence of recurrence. Continue arimidex for now.  Stable.   # Chronic Elevated tumor marker CA 27-29-    Stable.   # Osteoporosis surveillance-she is on fosomax/ vit D;  bone density May 2017 osteopenia- Stable.   # HTN- elevated BP- recommend checking at home; and bring a log at next visit. Continue current medications- Stable.   # IV access: port flush every 2 months-   *55m  # DISPOSITION: # follow up in 3 months- MD; labs-port flush; cbc/cmp;-- Dr.B      Earna Coder, MD 01/15/2023 4:00 PM

## 2023-01-15 NOTE — Assessment & Plan Note (Addendum)
##   Right upper lobe/middle lobe- MALT [NO RT] -  S/p evaluation with Dr.A- and Bronch- MAY 31st, 2024-- POSITIVE FOR MALIGNANCY; CONSISTENT WITH INVOLVEMENT BY PATIENT'S KNOWN MALT / MARGINAL ZONE LYMPHOMA.  JULY 17th- PET- Chronic irregular patchy geographic regions of consolidation and nodular thickening of the peribronchovascular interstitium throughout the mid to upper right lung with associated patchy mild hypermetabolism and marked varicoid bronchiectasis, overall mildly progressive since 03/02/2021 PET-CT, including a new hypermetabolic 3.0 x 2.5 cm masslike focus of consolidation in the medial aspect of the superior segment right lower lobe. Favor mild progression of pulmonary lymphoma. Otherwise no evidence of hypermetabolic metastatic disease. JULY 2024- Hepatitis-work up-NEG. JULy 2024- Rituximab weekly x 4.   # PET scan NOV 2024- Marked improvement in consolidative infiltrative pattern in the RIGHT upper lobe. No focal hypermetabolic activity remains in the RIGHT lung above background blood pool activity; No evidence of recurrent lymphoma in the chest; No evidence of metastatic lymphoma or breast cancer in the neck, abdomen, pelvis, or skeleton. Will repeat imaging in 6 months. Will order at netx visit.   # Nausea- G-1- recommend anti-emetics prn. Add prophylactic- Zofran PO.  Stable.   # 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 Clinically no evidence of recurrence. Continue arimidex for now.  Stable.   # Chronic Elevated tumor marker CA 27-29-    Stable.   # Osteoporosis surveillance-she is on fosomax/ vit D;  bone density May 2017 osteopenia- Stable.   # HTN- elevated BP- recommend checking at home; and bring a log at next visit. Continue current medications- Stable.   # IV access: port flush every 2 months-   *76m  # DISPOSITION: # follow up in 3 months- MD; labs-port flush; cbc/cmp;-- Dr.B

## 2023-01-16 ENCOUNTER — Other Ambulatory Visit: Payer: Self-pay

## 2023-02-01 ENCOUNTER — Other Ambulatory Visit: Payer: Self-pay

## 2023-02-05 ENCOUNTER — Ambulatory Visit
Admission: RE | Admit: 2023-02-05 | Discharge: 2023-02-05 | Disposition: A | Discharge status: Home / Self Care | Payer: Medicare Other | Source: Ambulatory Visit | Attending: Internal Medicine | Admitting: Internal Medicine

## 2023-02-05 ENCOUNTER — Other Ambulatory Visit: Payer: Self-pay | Admitting: Internal Medicine

## 2023-02-05 DIAGNOSIS — Z1231 Encounter for screening mammogram for malignant neoplasm of breast: Secondary | ICD-10-CM | POA: Insufficient documentation

## 2023-03-14 DIAGNOSIS — J069 Acute upper respiratory infection, unspecified: Secondary | ICD-10-CM | POA: Diagnosis not present

## 2023-03-27 DIAGNOSIS — H524 Presbyopia: Secondary | ICD-10-CM | POA: Diagnosis not present

## 2023-04-04 ENCOUNTER — Telehealth: Payer: Self-pay | Admitting: Internal Medicine

## 2023-04-04 NOTE — Telephone Encounter (Signed)
Called patient to verify if change to APP was ok and patient confirmed that it is.

## 2023-04-17 ENCOUNTER — Other Ambulatory Visit: Payer: Self-pay | Admitting: Internal Medicine

## 2023-04-17 DIAGNOSIS — C50912 Malignant neoplasm of unspecified site of left female breast: Secondary | ICD-10-CM

## 2023-04-19 ENCOUNTER — Encounter: Payer: Self-pay | Admitting: Internal Medicine

## 2023-04-24 ENCOUNTER — Inpatient Hospital Stay: Payer: Medicare Other | Attending: Internal Medicine

## 2023-04-24 ENCOUNTER — Encounter: Payer: Self-pay | Admitting: Nurse Practitioner

## 2023-04-24 ENCOUNTER — Inpatient Hospital Stay (HOSPITAL_BASED_OUTPATIENT_CLINIC_OR_DEPARTMENT_OTHER): Payer: Medicare Other | Admitting: Nurse Practitioner

## 2023-04-24 ENCOUNTER — Other Ambulatory Visit: Payer: Self-pay

## 2023-04-24 VITALS — BP 186/66 | HR 88 | Temp 97.6°F | Resp 19 | Wt 136.2 lb

## 2023-04-24 DIAGNOSIS — Z9012 Acquired absence of left breast and nipple: Secondary | ICD-10-CM | POA: Diagnosis not present

## 2023-04-24 DIAGNOSIS — Z5181 Encounter for therapeutic drug level monitoring: Secondary | ICD-10-CM

## 2023-04-24 DIAGNOSIS — C884 Extranodal marginal zone b-cell lymphoma of mucosa-associated lymphoid tissue (malt-lymphoma) not having achieved remission: Secondary | ICD-10-CM

## 2023-04-24 DIAGNOSIS — Z1731 Human epidermal growth factor receptor 2 positive status: Secondary | ICD-10-CM | POA: Insufficient documentation

## 2023-04-24 DIAGNOSIS — Z79811 Long term (current) use of aromatase inhibitors: Secondary | ICD-10-CM | POA: Diagnosis not present

## 2023-04-24 DIAGNOSIS — Z7982 Long term (current) use of aspirin: Secondary | ICD-10-CM | POA: Insufficient documentation

## 2023-04-24 DIAGNOSIS — M8580 Other specified disorders of bone density and structure, unspecified site: Secondary | ICD-10-CM | POA: Insufficient documentation

## 2023-04-24 DIAGNOSIS — Z9221 Personal history of antineoplastic chemotherapy: Secondary | ICD-10-CM | POA: Diagnosis not present

## 2023-04-24 DIAGNOSIS — Z79899 Other long term (current) drug therapy: Secondary | ICD-10-CM | POA: Insufficient documentation

## 2023-04-24 DIAGNOSIS — R978 Other abnormal tumor markers: Secondary | ICD-10-CM | POA: Diagnosis not present

## 2023-04-24 DIAGNOSIS — Z171 Estrogen receptor negative status [ER-]: Secondary | ICD-10-CM | POA: Diagnosis not present

## 2023-04-24 DIAGNOSIS — Z8572 Personal history of non-Hodgkin lymphomas: Secondary | ICD-10-CM | POA: Insufficient documentation

## 2023-04-24 DIAGNOSIS — I10 Essential (primary) hypertension: Secondary | ICD-10-CM | POA: Diagnosis not present

## 2023-04-24 DIAGNOSIS — C50812 Malignant neoplasm of overlapping sites of left female breast: Secondary | ICD-10-CM | POA: Insufficient documentation

## 2023-04-24 DIAGNOSIS — Z95828 Presence of other vascular implants and grafts: Secondary | ICD-10-CM

## 2023-04-24 LAB — CBC WITH DIFFERENTIAL (CANCER CENTER ONLY)
Abs Immature Granulocytes: 0.02 10*3/uL (ref 0.00–0.07)
Basophils Absolute: 0 10*3/uL (ref 0.0–0.1)
Basophils Relative: 0 %
Eosinophils Absolute: 0.1 10*3/uL (ref 0.0–0.5)
Eosinophils Relative: 1 %
HCT: 37.4 % (ref 36.0–46.0)
Hemoglobin: 12.3 g/dL (ref 12.0–15.0)
Immature Granulocytes: 0 %
Lymphocytes Relative: 41 %
Lymphs Abs: 2.4 10*3/uL (ref 0.7–4.0)
MCH: 30.2 pg (ref 26.0–34.0)
MCHC: 32.9 g/dL (ref 30.0–36.0)
MCV: 91.9 fL (ref 80.0–100.0)
Monocytes Absolute: 1.1 10*3/uL — ABNORMAL HIGH (ref 0.1–1.0)
Monocytes Relative: 18 %
Neutro Abs: 2.4 10*3/uL (ref 1.7–7.7)
Neutrophils Relative %: 40 %
Platelet Count: 258 10*3/uL (ref 150–400)
RBC: 4.07 MIL/uL (ref 3.87–5.11)
RDW: 13.2 % (ref 11.5–15.5)
WBC Count: 6 10*3/uL (ref 4.0–10.5)
nRBC: 0 % (ref 0.0–0.2)

## 2023-04-24 LAB — CMP (CANCER CENTER ONLY)
ALT: 20 U/L (ref 0–44)
AST: 25 U/L (ref 15–41)
Albumin: 4 g/dL (ref 3.5–5.0)
Alkaline Phosphatase: 71 U/L (ref 38–126)
Anion gap: 9 (ref 5–15)
BUN: 13 mg/dL (ref 8–23)
CO2: 27 mmol/L (ref 22–32)
Calcium: 8.9 mg/dL (ref 8.9–10.3)
Chloride: 102 mmol/L (ref 98–111)
Creatinine: 0.65 mg/dL (ref 0.44–1.00)
GFR, Estimated: >60 mL/min (ref >60)
Glucose, Bld: 124 mg/dL — ABNORMAL HIGH (ref 70–99)
Potassium: 3.3 mmol/L — ABNORMAL LOW (ref 3.5–5.1)
Sodium: 138 mmol/L (ref 135–145)
Total Bilirubin: 0.8 mg/dL (ref 0.0–1.2)
Total Protein: 7.2 g/dL (ref 6.5–8.1)

## 2023-04-24 MED ORDER — HEPARIN SOD (PORK) LOCK FLUSH 100 UNIT/ML IV SOLN
500.0000 [IU] | Freq: Once | INTRAVENOUS | Status: AC
  Administered 2023-04-24: 500 [IU] via INTRAVENOUS
  Filled 2023-04-24: qty 5

## 2023-04-24 MED ORDER — SODIUM CHLORIDE 0.9% FLUSH
10.0000 mL | Freq: Once | INTRAVENOUS | Status: AC
  Administered 2023-04-24: 10 mL via INTRAVENOUS
  Filled 2023-04-24: qty 10

## 2023-04-24 NOTE — Progress Notes (Signed)
 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 STABLE band like opacity in RUL/RML MAY 2017 CT-NED;   June 2024- Dr.Aleskerov- A. LYMPH NODE, 10R; EBUS-ASSISTED FNA:  - POSITIVE FOR MALIGNANCY.  - CONSISTENT WITH INVOLVEMENT BY PATIENT'S KNOWN MALT / MARGINAL ZONE  LYMPHOMA.   Specimen is adequate for interpretation.   Comment:  Given the patient's history of MALT lymphoma CD20 and CD43 stains were  performed. Co-expression of CD20 and CD43 is present and supports the  above diagnosis.   # JULY 17th- PET- IMPRESSION: 1. Chronic irregular patchy geographic regions of consolidation and nodular thickening of the peribronchovascular interstitium throughout the mid to upper right lung with associated patchy mild hypermetabolism and marked varicoid bronchiectasis, overall mildly progressive since 03/02/2021 PET-CT, including a new hypermetabolic 3.0 x 2.5 cm masslike focus of consolidation in the medial aspect of the superior segment right lower lobe. Favor mild progression of pulmonary lymphoma. Otherwise no evidence of hypermetabolic metastatic disease. JULY 2024- Hepatitis-work up-NEG.   # AUG 2024- s/p rituxan weekly x4- NOV 2024 PET significant improvement of lymphoma of the lung.  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      Mucosa-associated lymphoid tissue (MALT) lymphoma  04/21/2015 Initial Diagnosis   Mucosa-associated lymphoid tissue (MALT) lymphoma (HCC)   09/06/2022 Cancer Staging   Staging form: Hodgkin and Non-Hodgkin Lymphoma, AJCC 7th Edition - Clinical: Stage I (E - Extranodal, A - Asymptomatic) - Signed by Earna Coder, MD on 09/06/2022 Source of metastatic specimen: Lung Immune suppression conditions: None   09/20/2022 -  Chemotherapy   Patient is on Treatment Plan : NON-HODGKINS LYMPHOMA Rituximab q7d     Carcinoma of overlapping sites of left breast in female, estrogen receptor negative (HCC)  09/23/2015 Initial Diagnosis   Cancer of overlapping sites of left female breast (HCC)    INTERVAL HISTORY: Ambulating independently.  Accompanied by daughter.  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 She feels well and denies nausea, vomiting. Denies worsening cough or shortness of breath. She denies fevers, chills, or unintentional weight loss. No new lumps or bumps.   Review of Systems  Constitutional:  Positive for malaise/fatigue. Negative for chills, diaphoresis and fever.  HENT:  Negative for nosebleeds and sore throat.   Eyes:  Negative for double vision.  Respiratory:  Negative for cough, hemoptysis, sputum production, shortness of breath and wheezing.   Cardiovascular:  Negative for chest pain, palpitations, orthopnea and leg swelling.  Gastrointestinal:  Negative for abdominal pain, blood in stool, constipation, diarrhea, heartburn, melena, nausea and vomiting.  Genitourinary:  Negative for dysuria, frequency and urgency.   Musculoskeletal:  Positive for back pain and joint pain.  Skin: Negative.  Negative for itching and rash.  Neurological:  Negative for dizziness, tingling, focal weakness, weakness and headaches.  Endo/Heme/Allergies:  Does not bruise/bleed easily.  Psychiatric/Behavioral:  Negative for depression. The patient is not nervous/anxious and does not have insomnia.     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 2 cm axillary node. One of 11 nodes positive on post-adjuvant chemotherapy axillary dissection. 3+ centimeter tumor.   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   MALT lymphoma    Murmur    Osteoporosis    Personal history of chemotherapy     PAST SURGICAL HISTORY :   Past Surgical History:  Procedure Laterality Date   ABDOMINAL HYSTERECTOMY     APPENDECTOMY     BREAST SURGERY Left 02/25/14   Modified radical mastectomy, T2 N1. Grade 3, ER, PR negative, HER-2/neu 3+.   COLONOSCOPY WITH PROPOFOL N/A 01/26/2016   Procedure: COLONOSCOPY WITH PROPOFOL;  Surgeon: Earline Mayotte, MD;  Location: [facility] ENDOSCOPY;  Service: Endoscopy;  Laterality: N/A;   IR CV LINE INJECTION  10/05/2022   MASTECTOMY Left 02-25-14   Dr Lemar Livings   VIDEO BRONCHOSCOPY WITH ENDOBRONCHIAL ULTRASOUND N/A 07/14/2022   Procedure: VIDEO BRONCHOSCOPY WITH ENDOBRONCHIAL ULTRASOUND;  Surgeon: Vida Rigger, MD;  Location: [facility] ORS;  Service: Thoracic;  Laterality: N/A;    FAMILY HISTORY :   Family History  Problem Relation Age of Onset   Hypertension Mother    Hypertension Maternal Aunt    Hypertension Maternal Uncle    Hypertension Maternal Aunt    Hypertension Maternal Aunt    Breast cancer Neg Hx     SOCIAL HISTORY:   Social History   Tobacco Use    Smoking status: Never   Smokeless tobacco: Never  Vaping Use   Vaping status: Never Used  Substance Use Topics   Alcohol use: No   Drug use: No    ALLERGIES:  has no known allergies.  MEDICATIONS:  Current Outpatient Medications  Medication Sig Dispense Refill   alendronate (FOSAMAX) 70 MG tablet Take 70 mg by mouth once a week. Take with a full glass of water on an empty stomach.  Wednesdays     amLODipine (NORVASC) 2.5 MG tablet Take 2.5 mg by mouth every morning.     anastrozole (ARIMIDEX) 1 MG tablet TAKE 1 TABLET BY MOUTH EVERY DAY 90 tablet 3   aspirin 81 MG tablet Take 81 mg by mouth daily.     cholecalciferol (VITAMIN D) 1000 UNITS tablet Take 1,000 Units by mouth daily.     lidocaine-prilocaine (EMLA) cream Apply 1 application. topically as needed. Apply to port and cover with saran wrap 1-2 hours prior to port access 30 g 4   losartan-hydrochlorothiazide (HYZAAR) 100-25 MG per tablet Take 1 tablet by mouth every morning.     Multiple Vitamins-Minerals (MULTIVITAMIN WITH MINERALS) tablet Take 1 tablet by mouth daily.     potassium chloride SA (KLOR-CON M) 20 MEQ tablet Take 20 mEq by mouth every morning.     simvastatin (ZOCOR) 40 MG tablet Take 40 mg by mouth every morning.     ondansetron (  ZOFRAN) 8 MG tablet One pill every 8 hours as needed for nausea/vomitting. (Patient not taking: Reported on 04/24/2023) 40 tablet 1   No current facility-administered medications for this visit.   Facility-Administered Medications Ordered in Other Visits  Medication Dose Route Frequency Provider Last Rate Last Admin   heparin lock flush 100 UNIT/ML injection            sodium chloride flush (NS) 0.9 % injection 10 mL  10 mL Intravenous PRN Louretta Shorten R, MD   10 mL at 10/19/16 1054   sodium chloride flush (NS) 0.9 % injection 10 mL  10 mL Intravenous PRN Earna Coder, MD   10 mL at 12/13/17 1050    PHYSICAL EXAMINATION: ECOG PERFORMANCE STATUS: 0 - Asymptomatic  BP  (!) 186/66   Pulse 88   Temp 97.6 F (36.4 C)   Resp 19   Wt 136 lb 3.2 oz (61.8 kg)   SpO2 99%   BMI 27.51 kg/m   Filed Weights   04/24/23 1055  Weight: 136 lb 3.2 oz (61.8 kg)   Physical Exam Constitutional:      Comments: Frail-appearing female patient.  She is walking herself.  Alone.  HENT:     Head: Normocephalic and atraumatic.     Mouth/Throat:     Pharynx: No oropharyngeal exudate.  Eyes:     Pupils: Pupils are equal, round, and reactive to light.  Cardiovascular:     Rate and Rhythm: Normal rate and regular rhythm.  Pulmonary:     Effort: No respiratory distress.     Breath sounds: No wheezing.  Abdominal:     General: Bowel sounds are normal. There is no distension.     Palpations: Abdomen is soft. There is no mass.     Tenderness: There is no abdominal tenderness. There is no guarding or rebound.  Musculoskeletal:        General: No tenderness. Normal range of motion.     Cervical back: Normal range of motion and neck supple.  Skin:    General: Skin is warm.  Neurological:     Mental Status: She is alert and oriented to person, place, and time.  Psychiatric:        Mood and Affect: Affect normal.     LABORATORY DATA:  I have reviewed the data as listed    Component Value Date/Time   NA 138 04/24/2023 1037   NA 136 05/27/2014 0942   K 3.3 (L) 04/24/2023 1037   K 3.4 (L) 05/27/2014 0942   CL 102 04/24/2023 1037   CL 102 05/27/2014 0942   CO2 27 04/24/2023 1037   CO2 29 05/27/2014 0942   GLUCOSE 124 (H) 04/24/2023 1037   GLUCOSE 106 (H) 05/27/2014 0942   BUN 13 04/24/2023 1037   BUN 13 05/27/2014 0942   CREATININE 0.65 04/24/2023 1037   CREATININE 0.58 05/27/2014 0942   CALCIUM 8.9 04/24/2023 1037   CALCIUM 8.6 (L) 05/27/2014 0942   PROT 7.2 04/24/2023 1037   PROT 6.8 05/27/2014 0942   ALBUMIN 4.0 04/24/2023 1037   ALBUMIN 3.8 05/27/2014 0942   AST 25 04/24/2023 1037   ALT 20 04/24/2023 1037   ALT 14 05/27/2014 0942   ALKPHOS 71  04/24/2023 1037   ALKPHOS 69 05/27/2014 0942   BILITOT 0.8 04/24/2023 1037   GFRNONAA >60 04/24/2023 1037   GFRNONAA >60 05/27/2014 0942   GFRAA >60 10/22/2019 1457   GFRAA >60 05/27/2014 0942   Lab Results  Component Value Date   WBC 6.0 04/24/2023   NEUTROABS 2.4 04/24/2023   HGB 12.3 04/24/2023   HCT 37.4 04/24/2023   MCV 91.9 04/24/2023   PLT 258 04/24/2023      Chemistry      Component Value Date/Time   NA 138 04/24/2023 1037   NA 136 05/27/2014 0942   K 3.3 (L) 04/24/2023 1037   K 3.4 (L) 05/27/2014 0942   CL 102 04/24/2023 1037   CL 102 05/27/2014 0942   CO2 27 04/24/2023 1037   CO2 29 05/27/2014 0942   BUN 13 04/24/2023 1037   BUN 13 05/27/2014 0942   CREATININE 0.65 04/24/2023 1037   CREATININE 0.58 05/27/2014 0942      Component Value Date/Time   CALCIUM 8.9 04/24/2023 1037   CALCIUM 8.6 (L) 05/27/2014 0942   ALKPHOS 71 04/24/2023 1037   ALKPHOS 69 05/27/2014 0942   AST 25 04/24/2023 1037   ALT 20 04/24/2023 1037   ALT 14 05/27/2014 0942   BILITOT 0.8 04/24/2023 1037       ASSESSMENT & PLAN:   Mucosa-associated lymphoid tissue (MALT) lymphoma -  # Right upper lobe/middle lobe- MALT [NO RT] -  S/p evaluation with Dr.A- and Bronch- MAY 31st, 2024-- POSITIVE FOR MALIGNANCY; CONSISTENT WITH INVOLVEMENT BY PATIENT'S KNOWN MALT / MARGINAL ZONE LYMPHOMA.  JULY 17th- PET- Chronic irregular patchy geographic regions of consolidation and nodular thickening of the peribronchovascular interstitium throughout the mid to upper right lung with associated patchy mild hypermetabolism and marked varicoid bronchiectasis, overall mildly progressive since 03/02/2021 PET-CT, including a new hypermetabolic 3.0 x 2.5 cm masslike focus of consolidation in the medial aspect of the superior segment right lower lobe. Favor mild progression of pulmonary lymphoma. Otherwise no evidence of hypermetabolic metastatic disease. JULY 2024- Hepatitis-work up-NEG. July 2024- Rituximab weekly x  4, completed 10/11/22.    # PET scan NOV 2024- Marked improvement in consolidative infiltrative pattern in the RIGHT upper lobe. No focal hypermetabolic activity remains in the RIGHT lung above background blood pool activity; No evidence of recurrent lymphoma in the chest; No evidence of metastatic lymphoma or breast cancer in the neck, abdomen, pelvis, or skeleton. Clinically asymptomatic. Plan to repeat PET prior to next visit.     # Nausea- G-1- recommend anti-emetics prn. Prophylactic zofran was previously added but patient has not been taking. Describes as intermittent and not bothersome. Can use as needed.     # Stage II ER negative PR weak HER-2/neu positive left breast cancer s/p left mastectomy. And adjuvant/maintenance Herceptin [finished Feb 2017]. Clinically no evidence of recurrence. Continue arimidex for weakly positive PR. Plan to repeat mammogram with Dr Arlana Pouch December 2025 of right breast.    # Chronic Elevated tumor marker CA 27-29- 08/02/22- 52. Elevated. Plan to recheck at next visit. Significance unclear.     # Osteopenia- May 2017 bone density revealed osteopenia. On fosamax, vitamin D. July 2022 bone density was stable with T score -1.5, osteopenia. Continue fosamax, vit D. Reviewed that AI may worsen bone density. Can repeat bone density with her next mammogram Dec 2025.    # HTN- elevated BP in clinic. Reviewed log that supports more normal BPs at home. Continue current medications and follow up with cardiology.    # IV access: port flush every 2 months-    *27m   DISPOSITION: 3 mo pet 2 weeks later- port/lab (cbc, cmp, vit d, ca27.29), Dr Donneta Romberg- [place]  No problem-specific Assessment & Plan notes found for  this encounter.    Alinda Dooms, NP 04/24/2023

## 2023-04-25 ENCOUNTER — Other Ambulatory Visit: Payer: Self-pay

## 2023-05-23 ENCOUNTER — Encounter: Payer: Self-pay | Admitting: Internal Medicine

## 2023-06-18 DIAGNOSIS — E785 Hyperlipidemia, unspecified: Secondary | ICD-10-CM | POA: Diagnosis not present

## 2023-06-18 DIAGNOSIS — I10 Essential (primary) hypertension: Secondary | ICD-10-CM | POA: Diagnosis not present

## 2023-06-20 ENCOUNTER — Encounter: Payer: Self-pay | Admitting: Internal Medicine

## 2023-06-20 DIAGNOSIS — E785 Hyperlipidemia, unspecified: Secondary | ICD-10-CM | POA: Diagnosis not present

## 2023-06-20 DIAGNOSIS — I10 Essential (primary) hypertension: Secondary | ICD-10-CM | POA: Diagnosis not present

## 2023-07-25 ENCOUNTER — Ambulatory Visit
Admission: RE | Admit: 2023-07-25 | Discharge: 2023-07-25 | Disposition: A | Discharge status: Home / Self Care | Source: Ambulatory Visit | Attending: Nurse Practitioner | Admitting: Nurse Practitioner

## 2023-07-25 DIAGNOSIS — J479 Bronchiectasis, uncomplicated: Secondary | ICD-10-CM | POA: Diagnosis not present

## 2023-07-25 DIAGNOSIS — K5730 Diverticulosis of large intestine without perforation or abscess without bleeding: Secondary | ICD-10-CM | POA: Insufficient documentation

## 2023-07-25 DIAGNOSIS — C884 Extranodal marginal zone b-cell lymphoma of mucosa-associated lymphoid tissue (malt-lymphoma) not having achieved remission: Secondary | ICD-10-CM | POA: Insufficient documentation

## 2023-07-25 DIAGNOSIS — R918 Other nonspecific abnormal finding of lung field: Secondary | ICD-10-CM | POA: Diagnosis not present

## 2023-07-25 LAB — GLUCOSE, CAPILLARY: Glucose-Capillary: 97 mg/dL (ref 70–99)

## 2023-07-25 MED ORDER — FLUDEOXYGLUCOSE F - 18 (FDG) INJECTION
7.0000 | Freq: Once | INTRAVENOUS | Status: AC | PRN
  Administered 2023-07-25: 7.54 via INTRAVENOUS

## 2023-08-07 ENCOUNTER — Other Ambulatory Visit: Payer: Self-pay | Admitting: *Deleted

## 2023-08-07 DIAGNOSIS — Z171 Estrogen receptor negative status [ER-]: Secondary | ICD-10-CM

## 2023-08-07 DIAGNOSIS — C884 Extranodal marginal zone b-cell lymphoma of mucosa-associated lymphoid tissue (malt-lymphoma) not having achieved remission: Secondary | ICD-10-CM

## 2023-08-08 ENCOUNTER — Inpatient Hospital Stay: Attending: Internal Medicine

## 2023-08-08 ENCOUNTER — Telehealth: Payer: Self-pay

## 2023-08-08 ENCOUNTER — Encounter: Payer: Self-pay | Admitting: Internal Medicine

## 2023-08-08 ENCOUNTER — Inpatient Hospital Stay (HOSPITAL_BASED_OUTPATIENT_CLINIC_OR_DEPARTMENT_OTHER): Admitting: Internal Medicine

## 2023-08-08 DIAGNOSIS — Z1731 Human epidermal growth factor receptor 2 positive status: Secondary | ICD-10-CM | POA: Insufficient documentation

## 2023-08-08 DIAGNOSIS — Z7982 Long term (current) use of aspirin: Secondary | ICD-10-CM | POA: Insufficient documentation

## 2023-08-08 DIAGNOSIS — Z79899 Other long term (current) drug therapy: Secondary | ICD-10-CM | POA: Insufficient documentation

## 2023-08-08 DIAGNOSIS — C50812 Malignant neoplasm of overlapping sites of left female breast: Secondary | ICD-10-CM | POA: Diagnosis not present

## 2023-08-08 DIAGNOSIS — Z79811 Long term (current) use of aromatase inhibitors: Secondary | ICD-10-CM | POA: Diagnosis not present

## 2023-08-08 DIAGNOSIS — C884 Extranodal marginal zone b-cell lymphoma of mucosa-associated lymphoid tissue (malt-lymphoma) not having achieved remission: Secondary | ICD-10-CM

## 2023-08-08 DIAGNOSIS — Z8572 Personal history of non-Hodgkin lymphomas: Secondary | ICD-10-CM | POA: Diagnosis not present

## 2023-08-08 DIAGNOSIS — M810 Age-related osteoporosis without current pathological fracture: Secondary | ICD-10-CM | POA: Insufficient documentation

## 2023-08-08 DIAGNOSIS — Z9221 Personal history of antineoplastic chemotherapy: Secondary | ICD-10-CM | POA: Diagnosis not present

## 2023-08-08 DIAGNOSIS — Z171 Estrogen receptor negative status [ER-]: Secondary | ICD-10-CM

## 2023-08-08 DIAGNOSIS — M8580 Other specified disorders of bone density and structure, unspecified site: Secondary | ICD-10-CM

## 2023-08-08 DIAGNOSIS — R978 Other abnormal tumor markers: Secondary | ICD-10-CM

## 2023-08-08 LAB — CMP (CANCER CENTER ONLY)
ALT: 19 U/L (ref 0–44)
AST: 27 U/L (ref 15–41)
Albumin: 4.1 g/dL (ref 3.5–5.0)
Alkaline Phosphatase: 70 U/L (ref 38–126)
Anion gap: 8 (ref 5–15)
BUN: 14 mg/dL (ref 8–23)
CO2: 28 mmol/L (ref 22–32)
Calcium: 8.8 mg/dL — ABNORMAL LOW (ref 8.9–10.3)
Chloride: 102 mmol/L (ref 98–111)
Creatinine: 0.71 mg/dL (ref 0.44–1.00)
GFR, Estimated: >60 mL/min (ref >60)
Glucose, Bld: 119 mg/dL — ABNORMAL HIGH (ref 70–99)
Potassium: 3.3 mmol/L — ABNORMAL LOW (ref 3.5–5.1)
Sodium: 138 mmol/L (ref 135–145)
Total Bilirubin: 0.9 mg/dL (ref 0.0–1.2)
Total Protein: 7.5 g/dL (ref 6.5–8.1)

## 2023-08-08 LAB — CBC WITH DIFFERENTIAL (CANCER CENTER ONLY)
Abs Immature Granulocytes: 0.02 10*3/uL (ref 0.00–0.07)
Basophils Absolute: 0 10*3/uL (ref 0.0–0.1)
Basophils Relative: 1 %
Eosinophils Absolute: 0 10*3/uL (ref 0.0–0.5)
Eosinophils Relative: 1 %
HCT: 37.8 % (ref 36.0–46.0)
Hemoglobin: 12.6 g/dL (ref 12.0–15.0)
Immature Granulocytes: 0 %
Lymphocytes Relative: 34 %
Lymphs Abs: 2.1 10*3/uL (ref 0.7–4.0)
MCH: 30.5 pg (ref 26.0–34.0)
MCHC: 33.3 g/dL (ref 30.0–36.0)
MCV: 91.5 fL (ref 80.0–100.0)
Monocytes Absolute: 1 10*3/uL (ref 0.1–1.0)
Monocytes Relative: 17 %
Neutro Abs: 2.9 10*3/uL (ref 1.7–7.7)
Neutrophils Relative %: 47 %
Platelet Count: 231 10*3/uL (ref 150–400)
RBC: 4.13 MIL/uL (ref 3.87–5.11)
RDW: 13 % (ref 11.5–15.5)
WBC Count: 6.1 10*3/uL (ref 4.0–10.5)
nRBC: 0 % (ref 0.0–0.2)

## 2023-08-08 LAB — VITAMIN D 25 HYDROXY (VIT D DEFICIENCY, FRACTURES): Vit D, 25-Hydroxy: 51.94 ng/mL (ref 30–100)

## 2023-08-08 NOTE — Telephone Encounter (Signed)
 Daughter needs new FMLA forms done, she states they have to be called. It's a new company, FMLA source, phone [phone]. Since this is a new company, she doesn't know what to say about a start date since she's been on this before the change.

## 2023-08-08 NOTE — Progress Notes (Signed)
 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 S/p Taxol -Herceptin  [finished May 2016]; on Herceptin  [finished February 2017]; MARCH 2017- START arimidex   # MALT LYMPHOMA STAGE I E [s/p ACx 4]; CT AUG 2016- ? STABLE band like opacity in RUL/RML MAY 2017 CT-NED;   June 2024- Dr.Aleskerov- A. LYMPH NODE, 10R; EBUS-ASSISTED FNA:  - POSITIVE FOR MALIGNANCY.  - CONSISTENT WITH INVOLVEMENT BY PATIENT'S KNOWN MALT / MARGINAL ZONE  LYMPHOMA.   Specimen is adequate for interpretation.   Comment:  Given the patient's history of MALT lymphoma CD20 and CD43 stains were  performed. Co-expression of CD20 and CD43 is present and supports the  above diagnosis.   # JULY 17th- PET- IMPRESSION: 1. Chronic irregular patchy geographic regions of consolidation and nodular thickening of the peribronchovascular interstitium throughout the mid to upper right lung with associated patchy mild hypermetabolism and marked varicoid bronchiectasis, overall mildly progressive since 03/02/2021 PET-CT, including a new hypermetabolic 3.0 x 2.5 cm masslike focus of consolidation in the medial aspect of the superior segment right lower lobe. Favor mild progression of pulmonary lymphoma. Otherwise no evidence of hypermetabolic metastatic disease. JULY 2024- Hepatitis-work up-NEG.   # AUG 2024- s/p rituxan  weekly x4- NOV 2024 PET significant improvement of lymphoma of the lung.  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      Mucosa-associated lymphoid tissue (MALT) lymphoma  04/21/2015 Initial Diagnosis   Mucosa-associated lymphoid tissue (MALT) lymphoma (HCC)   09/06/2022 Cancer Staging   Staging form: Hodgkin and Non-Hodgkin Lymphoma, AJCC 7th Edition - Clinical: Stage I (E - Extranodal, A - Asymptomatic) - Signed by Rennie Cindy SAUNDERS, MD on 09/06/2022 Source of metastatic specimen: Lung Immune suppression conditions: None   09/20/2022 -  Chemotherapy   Patient is on Treatment Plan : NON-HODGKINS LYMPHOMA Rituximab  q7d     Carcinoma of overlapping sites of left breast in female, estrogen receptor negative (HCC)  09/23/2015 Initial Diagnosis   Cancer of overlapping sites of left female breast (HCC)    INTERVAL HISTORY: Ambulating independently.  Accompanied by daughter.  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   Currently, s/p Rituxan - s/p 4 infusion- appx  10 months ago.   Denies any nausea or vomiting. No worsening cough or shortness of breath.  No fevers or chills. Other wise, Continues to be asymptomatic. No new lumps or bumps.  Review of Systems  Constitutional:  Positive for malaise/fatigue. Negative for chills, diaphoresis and fever.  HENT:  Negative for nosebleeds and sore throat.   Eyes:  Negative for double vision.  Respiratory:  Negative for cough, hemoptysis, sputum production, shortness of breath and wheezing.   Cardiovascular:  Negative for chest pain, palpitations, orthopnea and  leg swelling.  Gastrointestinal:  Negative for abdominal pain, blood in stool, constipation, diarrhea, heartburn, melena, nausea and  vomiting.  Genitourinary:  Negative for dysuria, frequency and urgency.  Musculoskeletal:  Positive for back pain and joint pain.  Skin: Negative.  Negative for itching and rash.  Neurological:  Negative for dizziness, tingling, focal weakness, weakness and headaches.  Endo/Heme/Allergies:  Does not bruise/bleed easily.  Psychiatric/Behavioral:  Negative for depression. The patient is not nervous/anxious and does not have insomnia.      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 2 cm axillary node. One of 11 nodes positive on post-adjuvant chemotherapy axillary dissection. 3+ centimeter tumor.   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   MALT lymphoma    Murmur    Osteoporosis    Personal history of chemotherapy     PAST SURGICAL HISTORY :   Past Surgical History:  Procedure Laterality Date   ABDOMINAL HYSTERECTOMY     APPENDECTOMY     BREAST SURGERY Left 02/25/14   Modified radical mastectomy, T2 N1. Grade 3, ER, PR negative, HER-2/neu 3+.   COLONOSCOPY WITH PROPOFOL  N/A 01/26/2016   Procedure: COLONOSCOPY WITH PROPOFOL ;  Surgeon: Reyes LELON Cota, MD;  Location: [facility] ENDOSCOPY;  Service: Endoscopy;  Laterality: N/A;   IR CV LINE INJECTION  10/05/2022   MASTECTOMY Left 02-25-14   Dr Cota   VIDEO BRONCHOSCOPY WITH ENDOBRONCHIAL ULTRASOUND N/A 07/14/2022   Procedure: VIDEO BRONCHOSCOPY WITH ENDOBRONCHIAL ULTRASOUND;  Surgeon: Parris Manna, MD;  Location: [facility] ORS;  Service: Thoracic;  Laterality: N/A;    FAMILY HISTORY :   Family History  Problem Relation Age of Onset   Hypertension Mother    Hypertension Maternal Aunt    Hypertension Maternal Uncle    Hypertension Maternal Aunt    Hypertension Maternal Aunt    Breast  cancer Neg Hx     SOCIAL HISTORY:   Social History   Tobacco Use   Smoking status: Never   Smokeless tobacco: Never  Vaping Use   Vaping status: Never Used  Substance Use Topics   Alcohol use: No   Drug use: No    ALLERGIES:  has no known allergies.  MEDICATIONS:  Current Outpatient Medications  Medication Sig Dispense Refill   alendronate (FOSAMAX) 70 MG tablet Take 70 mg by mouth once a week. Take with a full glass of water on an empty stomach.  Wednesdays     amLODipine (NORVASC) 2.5 MG tablet Take 2.5 mg by mouth every morning.     anastrozole  (ARIMIDEX ) 1 MG tablet TAKE 1 TABLET BY MOUTH EVERY DAY 90 tablet 3   aspirin 81 MG tablet Take 81 mg by mouth daily.     cholecalciferol (VITAMIN D) 1000 UNITS tablet Take 1,000 Units by mouth daily.     lidocaine -prilocaine  (EMLA ) cream Apply 1 application. topically as needed. Apply to port and cover with saran wrap 1-2 hours prior to port access 30 g 4   losartan-hydrochlorothiazide (HYZAAR) 100-25 MG per tablet Take 1 tablet by mouth every morning.     Multiple Vitamins-Minerals (MULTIVITAMIN WITH MINERALS) tablet Take 1 tablet by mouth daily.     ondansetron  (ZOFRAN ) 8 MG tablet One pill every 8 hours as needed for  nausea/vomitting. 40 tablet 1   potassium chloride  SA (KLOR-CON  M) 20 MEQ tablet Take 20 mEq by mouth every morning.     simvastatin (ZOCOR) 40 MG tablet Take 40 mg by mouth every morning.     No current facility-administered medications for this visit.   Facility-Administered Medications Ordered in Other Visits  Medication Dose Route Frequency Provider Last Rate Last Admin   heparin  lock flush 100 UNIT/ML injection            sodium chloride  flush (NS) 0.9 % injection 10 mL  10 mL Intravenous PRN Alexandre Faries R, MD   10 mL at 10/19/16 1054   sodium chloride  flush (NS) 0.9 % injection 10 mL  10 mL Intravenous PRN Alzora Ha R, MD   10 mL at 12/13/17 1050    PHYSICAL EXAMINATION: ECOG PERFORMANCE  STATUS: 0 - Asymptomatic  BP (!) 185/63 (BP Location: Right Arm, Patient Position: Sitting, Cuff Size: Normal) Comment: pt told bp elevated, keep check at home, contact pcp if con't to stay elevated  Pulse 89   Temp (!) 96.8 F (36 C) (Tympanic)   Resp 18   Ht 4' 11 (1.499 m)   SpO2 99%   BMI 27.51 kg/m   There were no vitals filed for this visit.      Physical Exam Constitutional:      Comments: Frail-appearing female patient.  She is walking herself.  Alone.  HENT:     Head: Normocephalic and atraumatic.     Mouth/Throat:     Pharynx: No oropharyngeal exudate.   Eyes:     Pupils: Pupils are equal, round, and reactive to light.    Cardiovascular:     Rate and Rhythm: Normal rate and regular rhythm.  Pulmonary:     Effort: No respiratory distress.     Breath sounds: No wheezing.  Abdominal:     General: Bowel sounds are normal. There is no distension.     Palpations: Abdomen is soft. There is no mass.     Tenderness: There is no abdominal tenderness. There is no guarding or rebound.   Musculoskeletal:        General: No tenderness. Normal range of motion.     Cervical back: Normal range of motion and neck supple.   Skin:    General: Skin is warm.   Neurological:     Mental Status: She is alert and oriented to person, place, and time.   Psychiatric:        Mood and Affect: Affect normal.      LABORATORY DATA:  I have reviewed the data as listed    Component Value Date/Time   NA 138 08/08/2023 1035   NA 136 05/27/2014 0942   K 3.3 (L) 08/08/2023 1035   K 3.4 (L) 05/27/2014 0942   CL 102 08/08/2023 1035   CL 102 05/27/2014 0942   CO2 28 08/08/2023 1035   CO2 29 05/27/2014 0942   GLUCOSE 119 (H) 08/08/2023 1035   GLUCOSE 106 (H) 05/27/2014 0942   BUN 14 08/08/2023 1035   BUN 13 05/27/2014 0942   CREATININE 0.71 08/08/2023 1035   CREATININE 0.58 05/27/2014 0942   CALCIUM 8.8 (L) 08/08/2023 1035   CALCIUM 8.6 (L) 05/27/2014 0942   PROT 7.5  08/08/2023 1035   PROT 6.8 05/27/2014 0942   ALBUMIN 4.1 08/08/2023 1035   ALBUMIN 3.8 05/27/2014 0942   AST 27 08/08/2023 1035   ALT 19 08/08/2023 1035   ALT 14 05/27/2014  0942   ALKPHOS 70 08/08/2023 1035   ALKPHOS 69 05/27/2014 0942   BILITOT 0.9 08/08/2023 1035   GFRNONAA >60 08/08/2023 1035   GFRNONAA >60 05/27/2014 0942   GFRAA >60 10/22/2019 1457   GFRAA >60 05/27/2014 0942    No results found for: SPEP, UPEP  Lab Results  Component Value Date   WBC 6.1 08/08/2023   NEUTROABS 2.9 08/08/2023   HGB 12.6 08/08/2023   HCT 37.8 08/08/2023   MCV 91.5 08/08/2023   PLT 231 08/08/2023      Chemistry      Component Value Date/Time   NA 138 08/08/2023 1035   NA 136 05/27/2014 0942   K 3.3 (L) 08/08/2023 1035   K 3.4 (L) 05/27/2014 0942   CL 102 08/08/2023 1035   CL 102 05/27/2014 0942   CO2 28 08/08/2023 1035   CO2 29 05/27/2014 0942   BUN 14 08/08/2023 1035   BUN 13 05/27/2014 0942   CREATININE 0.71 08/08/2023 1035   CREATININE 0.58 05/27/2014 0942      Component Value Date/Time   CALCIUM 8.8 (L) 08/08/2023 1035   CALCIUM 8.6 (L) 05/27/2014 0942   ALKPHOS 70 08/08/2023 1035   ALKPHOS 69 05/27/2014 0942   AST 27 08/08/2023 1035   ALT 19 08/08/2023 1035   ALT 14 05/27/2014 0942   BILITOT 0.9 08/08/2023 1035       ASSESSMENT & PLAN:   Mucosa-associated lymphoid tissue (MALT) lymphoma (HCC) ## Right upper lobe/middle lobe- MALT [NO RT] -  S/p evaluation with Dr.A- and Bronch- MAY 31st, 2024-- POSITIVE FOR MALIGNANCY; CONSISTENT WITH INVOLVEMENT BY PATIENT'S KNOWN MALT / MARGINAL ZONE LYMPHOMA.  JULY 17th- PET- Chronic irregular patchy geographic regions of consolidation and nodular thickening of the peribronchovascular interstitium throughout the mid to upper right lung with associated patchy mild hypermetabolism and marked varicoid bronchiectasis, overall mildly progressive since 03/02/2021 PET-CT, including a new hypermetabolic 3.0 x 2.5 cm masslike focus of  consolidation in the medial aspect of the superior segment right lower lobe. Favor mild progression of pulmonary lymphoma. Otherwise no evidence of hypermetabolic metastatic disease. JULY 2024- Hepatitis-work up-NEG. JULY 2024- Rituximab  weekly x 4.   JUNE 2025- PET scan: No developing abnormal radiotracer uptake seen along the spleen or lymph nodes. The spleen is nonenlarged. Patchy right upper lobe opacity again identified with bronchiectasis and distortion and pleural thickening, unchanged from previous. No significant abnormal uptake.  # Stage II ER negative PR weak HER-2/neu positive breast cancer s/p  adjuvant/maintenance Herceptin  [finished Feb 2017]. Clinically no evidence of recurrence. Continue arimidex  for now [total 10 years]. Dec 2024- mammogram [PCP-WNL ] Stable.   # Chronic Elevated tumor marker CA 27-29-Stable.       # Osteoporosis surveillance-she is on fosomax/ vit D;  bone density  2022- osteopenia- Stable. Will repeat again   # HTN- elevated BP- recommend checking at home; and bring a log at next visit. Continue current medications- Stable.   #Incidental findings on Imaging  PET CT , 2025: Pelvic prolapse.  Colonic diverticulosis.  Kyphotic spine I eviewed/discussed/counseled the patient.   # IV access: port flush every 2-3 months-   *58m  # DISPOSITION: # port flush in 2 months # port flush in 4 months # follow up in 7 months- MD; labs-port flush; cbc/cmp; BMD prior--- Dr.B   # I reviewed the blood work- with the patient in detail; also reviewed the imaging independently [as summarized above]; and with the patient in detail.  Cindy JONELLE Joe, MD 08/08/2023 12:02 PM

## 2023-08-08 NOTE — Progress Notes (Signed)
 PET 07/25/23.  Daughter needs new FMLA forms done, she states they have to be called. It's a new company, FMLA source, phone [phone]. Message sent to Joen GAILS and [employer].

## 2023-08-08 NOTE — Telephone Encounter (Signed)
 Called number provided and there was an option to initiate FMLA request.  Spoke to patient's daughter Freyja and she was given this number by her manager but has not called to initiate claim.  Advise she call and start FMLA process.SABRA

## 2023-08-08 NOTE — Assessment & Plan Note (Addendum)
##   Right upper lobe/middle lobe- MALT [NO RT] -  S/p evaluation with Dr.A- and Bronch- MAY 31st, 2024-- POSITIVE FOR MALIGNANCY; CONSISTENT WITH INVOLVEMENT BY PATIENT'S KNOWN MALT / MARGINAL ZONE LYMPHOMA.  JULY 17th- PET- Chronic irregular patchy geographic regions of consolidation and nodular thickening of the peribronchovascular interstitium throughout the mid to upper right lung with associated patchy mild hypermetabolism and marked varicoid bronchiectasis, overall mildly progressive since 03/02/2021 PET-CT, including a new hypermetabolic 3.0 x 2.5 cm masslike focus of consolidation in the medial aspect of the superior segment right lower lobe. Favor mild progression of pulmonary lymphoma. Otherwise no evidence of hypermetabolic metastatic disease. JULY 2024- Hepatitis-work up-NEG. JULY 2024- Rituximab  weekly x 4.   JUNE 2025- PET scan: No developing abnormal radiotracer uptake seen along the spleen or lymph nodes. The spleen is nonenlarged. Patchy right upper lobe opacity again identified with bronchiectasis and distortion and pleural thickening, unchanged from previous. No significant abnormal uptake.  # Stage II ER negative PR weak HER-2/neu positive breast cancer s/p  adjuvant/maintenance Herceptin  [finished Feb 2017]. Clinically no evidence of recurrence. Continue arimidex  for now [total 10 years]. Dec 2024- mammogram [PCP-WNL ] Stable.   # Chronic Elevated tumor marker CA 27-29-Stable.       # Osteoporosis surveillance-she is on fosomax/ vit D;  bone density  2022- osteopenia- Stable. Will repeat again   # HTN- elevated BP- recommend checking at home; and bring a log at next visit. Continue current medications- Stable.   #Incidental findings on Imaging  PET CT , 2025: Pelvic prolapse.  Colonic diverticulosis.  Kyphotic spine I eviewed/discussed/counseled the patient.   # IV access: port flush every 2-3 months-   *100m  # DISPOSITION: # port flush in 2 months # port flush in 4 months #  follow up in 7 months- MD; labs-port flush; cbc/cmp; BMD prior--- Dr.B   # I reviewed the blood work- with the patient in detail; also reviewed the imaging independently [as summarized above]; and with the patient in detail.

## 2023-08-09 ENCOUNTER — Other Ambulatory Visit: Payer: Self-pay

## 2023-08-09 LAB — CANCER ANTIGEN 27.29: CA 27.29: 40.4 U/mL — ABNORMAL HIGH (ref 0.0–38.6)

## 2023-08-11 ENCOUNTER — Other Ambulatory Visit: Payer: Self-pay

## 2023-08-21 ENCOUNTER — Telehealth: Payer: Self-pay | Admitting: *Deleted

## 2023-08-21 NOTE — Telephone Encounter (Signed)
 The daughter Amye and [employer] is sending in the FMLA for her mother who also's name is Andrea Rodgers for the year, I spoke to the daughter and I will get on it.

## 2023-08-22 ENCOUNTER — Telehealth: Payer: Self-pay | Admitting: *Deleted

## 2023-08-22 NOTE — Telephone Encounter (Signed)
 I have sent the papers for Dr. Rennie to sign today so I can fax it into her work

## 2023-08-22 NOTE — Telephone Encounter (Signed)
 I called the daughter and let her know that the FMLA papers were sent out my fax and it went through and I am going to send her the forms that done.

## 2023-09-05 ENCOUNTER — Telehealth: Payer: Self-pay | Admitting: *Deleted

## 2023-09-05 NOTE — Telephone Encounter (Signed)
 The daughter called today and said that she needs some more time helping her mother and so she has to drive there is she has to help her with getting up getting dressed getting something to eat and bring her over and then after returning back to her home.  She would like to have a change from the 4 hours to 8 hours so that she would not get in trouble with her work I sent it in by faxed.

## 2023-10-02 ENCOUNTER — Other Ambulatory Visit: Payer: Self-pay | Admitting: *Deleted

## 2023-10-02 ENCOUNTER — Telehealth: Payer: Self-pay | Admitting: *Deleted

## 2023-10-02 MED ORDER — AMLODIPINE BESYLATE 2.5 MG PO TABS: 2.5000 mg | ORAL_TABLET | ORAL | 2 refills | Status: AC

## 2023-10-02 NOTE — Telephone Encounter (Signed)
 Per the daughter the MD is retired and nobody there to take her on.  She would like to have Dr. Rennie to give her amlodipine  2.5 mg daily

## 2023-10-08 ENCOUNTER — Inpatient Hospital Stay: Attending: Internal Medicine

## 2023-10-24 ENCOUNTER — Ambulatory Visit (INDEPENDENT_AMBULATORY_CARE_PROVIDER_SITE_OTHER)

## 2023-10-24 VITALS — BP 172/80 | HR 90 | Wt 144.0 lb

## 2023-10-24 DIAGNOSIS — R7309 Other abnormal glucose: Secondary | ICD-10-CM | POA: Diagnosis not present

## 2023-10-24 DIAGNOSIS — M810 Age-related osteoporosis without current pathological fracture: Secondary | ICD-10-CM | POA: Diagnosis not present

## 2023-10-24 DIAGNOSIS — C50812 Malignant neoplasm of overlapping sites of left female breast: Secondary | ICD-10-CM

## 2023-10-24 DIAGNOSIS — I10 Essential (primary) hypertension: Secondary | ICD-10-CM | POA: Insufficient documentation

## 2023-10-24 DIAGNOSIS — Z171 Estrogen receptor negative status [ER-]: Secondary | ICD-10-CM | POA: Diagnosis not present

## 2023-10-24 MED ORDER — LOSARTAN POTASSIUM-HCTZ 100-25 MG PO TABS: 1.0000 | ORAL_TABLET | ORAL | 1 refills | Status: AC

## 2023-10-24 NOTE — Progress Notes (Signed)
 New Patient Visit   Physician: Nakema Fake A Elma Shands, MD  Patient: Andrea Rodgers   DOB: Oct 15, 1940   83 y.o. Female  MRN: [id number] Visit Date: 10/24/2023   Chief Complaint  Patient presents with   Establish Care   Subjective  Andrea Rodgers is a 83 y.o. female who presents today as a new patient to establish care.   HPI  Discussed the use of AI scribe software for clinical note transcription with the patient, who gave verbal consent to proceed.  History of Present Illness   Andrea Rodgers is an 83 year old female who presents for a routine follow-up visit.  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  Denies history of CAD  Electrolyte supplementation - Takes potassium supplements  General health and functional status - Feels well overall with no current complaints - No urinary tract infections - No significant swelling in feet - Sleep is good - Decreased energy levels, attributed to age - Remains active with housework but does not walk regularly - Retired and satisfied with current lifestyle  Preventive care - Received recent influenza vaccination         ASSESSMENT & PLAN  Encounter Diagnoses  Name Primary?   Hypertension, unspecified type Yes    Orders Placed This Encounter   Procedures   CBC with Differential/Platelet   Comprehensive metabolic panel with GFR   Hemoglobin A1c   Lipid panel   Urinalysis, Routine w reflex microscopic    Assessment and Plan    Essential hypertension Blood pressure is elevated during the visit. She is on losartan , hydrochlorothiazide, and amlodipine . Reports blood pressure is usually well-controlled when checked by her previous physician, but she has not checked it at home recently. - Check blood pressure at home regularly - Refill losartan  prescription - Order blood work to assess current status  Hypokalemia, possible recurrent She is on potassium supplementation. Previous low potassium levels were noted, but current status is unknown. - Check potassium levels in blood work  Osteoporosis She is on alendronate and takes calcium and vitamin D . Has not had a bone density scan recently, possibly last done in 2017. Important to monitor bone density to assess the effectiveness of treatment. - Order bone density scan - Continue alendronate, calcium, and vitamin D   Left breast cancer Breast cancer was treated in 2015 or 2016 with no recurrence since. Continues to have regular mammograms, which have been normal. - Continue regular mammograms - she did have one this year  Lymphoma Lymphoma appears to be in remission with no current issues.  General Health Maintenance Received a flu shot at [facility] recently. Maintains a busy lifestyle with housework and does not smoke or drink. - Continue annual flu vaccinations             Objective  BP (!) 172/80   Pulse 90  Wt 144 lb (65.3 kg)   SpO2 98%   BMI 29.08 kg/m      Review of Systems  Constitutional:  Negative for chills, fever and weight loss.  Eyes:  Negative for blurred vision. h Respiratory:  Negative for cough and shortness of breath.   Cardiovascular:  Negative for chest pain and palpitations.  Skin:  Negative for rash.  Psychiatric/Behavioral:   Negative for depression. The patient is not nervous/anxious.      Physical Exam Physical Exam Vitals reviewed.  Constitutional:      Appearance: Normal appearance. Well-developed with normal weight.  HENT:     Head: Normocephalic and atraumatic.  Normal mucous membranes, no oral lesions Eyes:     Pupils: Pupils are equal, round, and reactive to light.  Neck:     Thyroid: No thyroid mass or thyromegaly.  Cardiovascular:     Rate and Rhythm: Normal rate and regular rhythm. Normal heart sounds. Normal peripheral pulses Pulmonary:     Normal breath sounds with normal effort Abdominal:   Abdomen is soft, without tenderness or noted hepatosplenomegaly Musculoskeletal:        General: No swelling or edema  Lymphadenopathy:     Cervical: No cervical adenopathy.  Skin:    General: Skin is warm and dry without noticeable rash. Neurological:     General: No focal deficit present.  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 2 cm axillary node. One of 11 nodes positive on post-adjuvant chemotherapy axillary dissection. 3+ centimeter tumor.   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   MALT lymphoma    Murmur    Osteoporosis    Personal history of chemotherapy    Past Surgical History:  Procedure Laterality Date   ABDOMINAL HYSTERECTOMY     APPENDECTOMY     BREAST SURGERY Left 02/25/14   Modified radical mastectomy, T2 N1. Grade 3, ER, PR negative, HER-2/neu 3+.   COLONOSCOPY WITH PROPOFOL  N/A 01/26/2016   Procedure: COLONOSCOPY WITH PROPOFOL ;  Surgeon: Reyes LELON Cota, MD;  Location[facility] ENDOSCOPY;  Service: Endoscopy;  Laterality: N/A;   IR CV LINE INJECTION  10/05/2022   MASTECTOMY  Left 02-25-14   Dr Cota   VIDEO BRONCHOSCOPY WITH ENDOBRONCHIAL ULTRASOUND N/A 07/14/2022   Procedure: VIDEO BRONCHOSCOPY WITH ENDOBRONCHIAL ULTRASOUND;  Surgeon: Parris Manna, MD;  Location: [facility] ORS;  Service: Thoracic;  Laterality: N/A;   Family Status  Relation Name Status   Mother  Deceased   Father  Other   Mat Aunt  (Not Specified)   Mat Uncle  (Not Specified)   Mat Aunt  (Not Specified)   Mat Aunt  (Not Specified)   Neg Hx  (Not Specified)  No partnership data on file   Family History  Problem Relation Age of Onset   Hypertension Mother    Hypertension Maternal Aunt    Hypertension Maternal Uncle    Hypertension Maternal Aunt    Hypertension Maternal Aunt    Breast cancer Neg Hx    Social History   Socioeconomic History   Marital status: Married    Spouse name: Not on file   Number of children: Not on file  Years of education: Not on file   Highest education level: Not on file  Occupational History   Not on file  Tobacco Use   Smoking status: Never   Smokeless tobacco: Never  Vaping Use   Vaping status: Never Used  Substance and Sexual Activity   Alcohol use: No   Drug use: No   Sexual activity: Yes    Birth control/protection: Post-menopausal  Other Topics Concern   Not on file  Social History Narrative   Not on file   Social Drivers of Health   Financial Resource Strain: Not on file  Food Insecurity: Not on file  Transportation Needs: Not on file  Physical Activity: Not on file  Stress: Not on file  Social Connections: Not on file   Outpatient Medications Prior to Visit  Medication Sig   alendronate (FOSAMAX) 70 MG tablet Take 70 mg by mouth once a week. Take with a full glass of water on an empty stomach.  Wednesdays   amLODipine  (NORVASC ) 2.5 MG tablet Take 1 tablet (2.5 mg total) by mouth every morning.   anastrozole  (ARIMIDEX ) 1 MG tablet TAKE 1 TABLET BY MOUTH EVERY DAY   aspirin 81 MG tablet Take 81 mg by mouth daily.    cholecalciferol (VITAMIN D ) 1000 UNITS tablet Take 1,000 Units by mouth daily.   lidocaine -prilocaine  (EMLA ) cream Apply 1 application. topically as needed. Apply to port and cover with saran wrap 1-2 hours prior to port access   Multiple Vitamins-Minerals (MULTIVITAMIN WITH MINERALS) tablet Take 1 tablet by mouth daily.   potassium chloride  SA (KLOR-CON  M) 20 MEQ tablet Take 20 mEq by mouth every morning.   simvastatin (ZOCOR) 40 MG tablet Take 40 mg by mouth every morning.   [DISCONTINUED] losartan -hydrochlorothiazide (HYZAAR) 100-25 MG per tablet Take 1 tablet by mouth every morning.   [DISCONTINUED] ondansetron  (ZOFRAN ) 8 MG tablet One pill every 8 hours as needed for nausea/vomitting.   [DISCONTINUED] heparin  lock flush 100 UNIT/ML injection    [DISCONTINUED] sodium chloride  flush (NS) 0.9 % injection 10 mL    [DISCONTINUED] sodium chloride  flush (NS) 0.9 % injection 10 mL    No facility-administered medications prior to visit.   No Known Allergies  Immunization History  Administered Date(s) Administered   HPV Quadrivalent 11/24/2014   PFIZER(Purple Top)SARS-COV-2 Vaccination 04/10/2019, 05/06/2019, 11/13/2019    Health Maintenance  Topic Date Due   Medicare Annual Wellness (AWV)  Never done   DTaP/Tdap/Td (1 - Tdap) Never done   Pneumococcal Vaccine: 50+ Years (1 of 2 - PCV) Never done   Zoster Vaccines- Shingrix (1 of 2) Never done   COVID-19 Vaccine (4 - 2025-26 season) 10/15/2023   DEXA SCAN  Completed   HPV VACCINES  Aged Out   Meningococcal B Vaccine  Aged Out    Patient Care Team: Everlene Parris LABOR, MD as PCP - General (Family Medicine) Dessa, Reyes ORN, MD as Consulting Physician (General Surgery) Corlis Honor BROCKS, MD (Internal Medicine) Rennie Cindy SAUNDERS, MD as Consulting Physician (Hematology and Oncology)  Depression Screen     No data to display           Parris LABOR Everlene, MD  [facility] [phone]  (phone) [phone] (fax)  [facility]

## 2023-10-25 ENCOUNTER — Encounter: Payer: Self-pay | Admitting: Internal Medicine

## 2023-10-25 LAB — HEMOGLOBIN A1C
Hgb A1c MFr Bld: 5.7 % — ABNORMAL HIGH (ref <5.7)
Mean Plasma Glucose: 117 mg/dL
eAG (mmol/L): 6.5 mmol/L

## 2023-10-25 LAB — URINALYSIS, ROUTINE W REFLEX MICROSCOPIC
Bacteria, UA: NONE SEEN /HPF
Bilirubin Urine: NEGATIVE
Glucose, UA: NEGATIVE
Hgb urine dipstick: NEGATIVE
Hyaline Cast: NONE SEEN /LPF
Ketones, ur: NEGATIVE
Nitrite: NEGATIVE
Protein, ur: NEGATIVE
Specific Gravity, Urine: 1.013 (ref 1.001–1.035)
Squamous Epithelial / HPF: NONE SEEN /HPF (ref <=5)
WBC, UA: NONE SEEN /HPF (ref 0–5)
pH: 8 (ref 5.0–8.0)

## 2023-10-25 LAB — COMPREHENSIVE METABOLIC PANEL WITH GFR
AG Ratio: 1.4 (calc) (ref 1.0–2.5)
ALT: 22 U/L (ref 6–29)
AST: 19 U/L (ref 10–35)
Albumin: 4.4 g/dL (ref 3.6–5.1)
Alkaline phosphatase (APISO): 77 U/L (ref 37–153)
BUN: 12 mg/dL (ref 7–25)
CO2: 31 mmol/L (ref 20–32)
Calcium: 9.4 mg/dL (ref 8.6–10.4)
Chloride: 102 mmol/L (ref 98–110)
Creat: 0.74 mg/dL (ref 0.60–0.95)
Globulin: 3.1 g/dL (ref 1.9–3.7)
Glucose, Bld: 117 mg/dL — ABNORMAL HIGH (ref 65–99)
Potassium: 4 mmol/L (ref 3.5–5.3)
Sodium: 141 mmol/L (ref 135–146)
Total Bilirubin: 0.4 mg/dL (ref 0.2–1.2)
Total Protein: 7.5 g/dL (ref 6.1–8.1)
eGFR: 79 mL/min/1.73m2 (ref >=60)

## 2023-10-25 LAB — CBC WITH DIFFERENTIAL/PLATELET
Absolute Lymphocytes: 2065 {cells}/uL (ref 850–3900)
Absolute Monocytes: 974 {cells}/uL — ABNORMAL HIGH (ref 200–950)
Basophils Absolute: 18 {cells}/uL (ref 0–200)
Basophils Relative: 0.3 %
Eosinophils Absolute: 47 {cells}/uL (ref 15–500)
Eosinophils Relative: 0.8 %
HCT: 37.9 % (ref 35.0–45.0)
Hemoglobin: 12.4 g/dL (ref 11.7–15.5)
MCH: 30.3 pg (ref 27.0–33.0)
MCHC: 32.7 g/dL (ref 32.0–36.0)
MCV: 92.7 fL (ref 80.0–100.0)
MPV: 11.1 fL (ref 7.5–12.5)
Monocytes Relative: 16.5 %
Neutro Abs: 2797 {cells}/uL (ref 1500–7800)
Neutrophils Relative %: 47.4 %
Platelets: 248 Thousand/uL (ref 140–400)
RBC: 4.09 Million/uL (ref 3.80–5.10)
RDW: 12.5 % (ref 11.0–15.0)
Total Lymphocyte: 35 %
WBC: 5.9 Thousand/uL (ref 3.8–10.8)

## 2023-10-25 LAB — LIPID PANEL
Cholesterol: 161 mg/dL (ref <200)
HDL: 65 mg/dL (ref >=50)
LDL Cholesterol (Calc): 79 mg/dL
Non-HDL Cholesterol (Calc): 96 mg/dL (ref <130)
Total CHOL/HDL Ratio: 2.5 (calc) (ref <5.0)
Triglycerides: 91 mg/dL (ref <150)

## 2023-11-01 ENCOUNTER — Telehealth: Payer: Self-pay

## 2023-11-12 ENCOUNTER — Ambulatory Visit (INDEPENDENT_AMBULATORY_CARE_PROVIDER_SITE_OTHER)

## 2023-11-12 VITALS — BP 160/65 | HR 77 | Ht 59.0 in | Wt 144.2 lb

## 2023-11-12 DIAGNOSIS — I10 Essential (primary) hypertension: Secondary | ICD-10-CM | POA: Diagnosis not present

## 2023-11-12 DIAGNOSIS — M8000XS Age-related osteoporosis with current pathological fracture, unspecified site, sequela: Secondary | ICD-10-CM | POA: Diagnosis not present

## 2023-11-12 MED ORDER — POTASSIUM CHLORIDE CRYS ER 20 MEQ PO TBCR: 20.0000 meq | EXTENDED_RELEASE_TABLET | ORAL | 3 refills | Status: AC

## 2023-11-12 NOTE — Progress Notes (Signed)
 /*/*/*           Progress Note  Physician: Parris DELENA Juneau, MD   HPI: ADDELINE Rodgers is a 83 y.o. female presenting on 11/12/2023 for Follow-up .  Discussed the use of AI scribe software for clinical note transcription with the patient, who gave verbal consent to proceed.  History of Present Illness        Patient seen in follow-up to review labs.  She currently is taking losartan  hydrochlorothiazide for hypertension plus potassium 20 mEq tablets.  Blood pressure at home is generally in the 120s to 140s systolic with normal diastolic pressures.  No chest pain or shortness of breath  Patient lipids also at goal she continues to take simvastatin 40 mg daily.  Patient has ongoing follow-up for surveillance with oncology status post breast cancer and MALT lymphoma.  She will be due to see them in February.  Patient has normal renal and hepatic function.  Labs overall generally unremarkable. She continues on alendronate for treatment of osteoporosis and does supplement calcium and vitamin D .    Recent Results (from the past 2160 hours)  CBC with Differential/Platelet     Status: Abnormal   Collection Time: 10/24/23  9:57 AM  Result Value Ref Range   WBC 5.9 3.8 - 10.8 Thousand/uL   RBC 4.09 3.80 - 5.10 Million/uL   Hemoglobin 12.4 11.7 - 15.5 g/dL   HCT 62.0 64.9 - 54.9 %   MCV 92.7 80.0 - 100.0 fL   MCH 30.3 27.0 - 33.0 pg   MCHC 32.7 32.0 - 36.0 g/dL    Comment: For adults, a slight decrease in the calculated MCHC value (in the range of 30 to 32 g/dL) is most likely not clinically significant; however, it should be interpreted with caution in correlation with other red cell parameters and the patient's clinical condition.    RDW 12.5 11.0 - 15.0 %   Platelets 248 140 - 400 Thousand/uL   MPV 11.1 7.5 - 12.5 fL   Neutro Abs 2,797 1,500 - 7,800 cells/uL   Absolute Lymphocytes 2,065 850 - 3,900 cells/uL   Absolute Monocytes 974 (H) 200 - 950 cells/uL   Eosinophils Absolute  47 15 - 500 cells/uL   Basophils Absolute 18 0 - 200 cells/uL   Neutrophils Relative % 47.4 %   Total Lymphocyte 35.0 %   Monocytes Relative 16.5 %   Eosinophils Relative 0.8 %   Basophils Relative 0.3 %  Comprehensive metabolic panel with GFR     Status: Abnormal   Collection Time: 10/24/23  9:57 AM  Result Value Ref Range   Glucose, Bld 117 (H) 65 - 99 mg/dL    Comment: .            Fasting reference interval . For someone without known diabetes, a glucose value between 100 and 125 mg/dL is consistent with prediabetes and should be confirmed with a follow-up test. .    BUN 12 7 - 25 mg/dL   Creat 9.25 9.39 - 9.04 mg/dL   eGFR 79 > OR = 60 fO/fpw/8.26f7   BUN/Creatinine Ratio SEE NOTE: 6 - 22 (calc)    Comment:    Not Reported: BUN and Creatinine are within    reference range. .    Sodium 141 135 - 146 mmol/L   Potassium 4.0 3.5 - 5.3 mmol/L   Chloride 102 98 - 110 mmol/L   CO2 31 20 - 32 mmol/L   Calcium 9.4 8.6 - 10.4 mg/dL  Total Protein 7.5 6.1 - 8.1 g/dL   Albumin 4.4 3.6 - 5.1 g/dL   Globulin 3.1 1.9 - 3.7 g/dL (calc)   AG Ratio 1.4 1.0 - 2.5 (calc)   Total Bilirubin 0.4 0.2 - 1.2 mg/dL   Alkaline phosphatase (APISO) 77 37 - 153 U/L   AST 19 10 - 35 U/L   ALT 22 6 - 29 U/L  Hemoglobin A1c     Status: Abnormal   Collection Time: 10/24/23  9:57 AM  Result Value Ref Range   Hgb A1c MFr Bld 5.7 (H) <5.7 %    Comment: For someone without known diabetes, a hemoglobin  A1c value between 5.7% and 6.4% is consistent with prediabetes and should be confirmed with a  follow-up test. . For someone with known diabetes, a value <7% indicates that their diabetes is well controlled. A1c targets should be individualized based on duration of diabetes, age, comorbid conditions, and other considerations. . This assay result is consistent with an increased risk of diabetes. . Currently, no consensus exists regarding use of hemoglobin A1c for diagnosis of diabetes for  children. .    Mean Plasma Glucose 117 mg/dL   eAG (mmol/L) 6.5 mmol/L  Lipid panel     Status: None   Collection Time: 10/24/23  9:57 AM  Result Value Ref Range   Cholesterol 161 <200 mg/dL   HDL 65 > OR = 50 mg/dL   Triglycerides 91 <849 mg/dL   LDL Cholesterol (Calc) 79 mg/dL (calc)    Comment: Reference range: <100 . Desirable range <100 mg/dL for primary prevention;   <70 mg/dL for patients with CHD or diabetic patients  with > or = 2 CHD risk factors. SABRA LDL-C is now calculated using the Martin-Hopkins  calculation, which is a validated novel method providing  better accuracy than the Friedewald equation in the  estimation of LDL-C.  Gladis APPLETHWAITE et al. SANDREA. 7986;689(80): 2061-2068  (http://education.[employer].com/faq/FAQ164)    Total CHOL/HDL Ratio 2.5 <5.0 (calc)   Non-HDL Cholesterol (Calc) 96 <869 mg/dL (calc)    Comment: For patients with diabetes plus 1 major ASCVD risk  factor, treating to a non-HDL-C goal of <100 mg/dL  (LDL-C of <29 mg/dL) is considered a therapeutic  option.   Urinalysis, Routine w reflex microscopic     Status: Abnormal   Collection Time: 10/24/23  9:57 AM  Result Value Ref Range   Color, Urine YELLOW YELLOW   APPearance CLEAR CLEAR   Specific Gravity, Urine 1.013 1.001 - 1.035   pH 8.0 5.0 - 8.0   Glucose, UA NEGATIVE NEGATIVE   Bilirubin Urine NEGATIVE NEGATIVE   Ketones, ur NEGATIVE NEGATIVE   Hgb urine dipstick NEGATIVE NEGATIVE   Protein, ur NEGATIVE NEGATIVE   Nitrite NEGATIVE NEGATIVE   Leukocytes,Ua TRACE (A) NEGATIVE   WBC, UA NONE SEEN 0 - 5 /HPF   RBC / HPF 0-2 0 - 2 /HPF   Squamous Epithelial / HPF NONE SEEN < OR = 5 /HPF   Bacteria, UA NONE SEEN NONE SEEN /HPF   Hyaline Cast NONE SEEN NONE SEEN /LPF     Medical history:  Relevant past medical, surgical, family and social history reviewed and updated as indicated. Interim medical history since our last visit reviewed.  Allergies and medications reviewed and  updated.   ROS: Negative unless specifically indicated above in HPI.    Current Outpatient Medications:    alendronate (FOSAMAX) 70 MG tablet, Take 70 mg by mouth once a week. Take  with a full glass of water on an empty stomach.  Wednesdays, Disp: , Rfl:    amLODipine  (NORVASC ) 2.5 MG tablet, Take 1 tablet (2.5 mg total) by mouth every morning., Disp: 90 tablet, Rfl: 2   anastrozole  (ARIMIDEX ) 1 MG tablet, TAKE 1 TABLET BY MOUTH EVERY DAY, Disp: 90 tablet, Rfl: 3   aspirin 81 MG tablet, Take 81 mg by mouth daily., Disp: , Rfl:    cholecalciferol (VITAMIN D ) 1000 UNITS tablet, Take 1,000 Units by mouth daily., Disp: , Rfl:    lidocaine -prilocaine  (EMLA ) cream, Apply 1 application. topically as needed. Apply to port and cover with saran wrap 1-2 hours prior to port access, Disp: 30 g, Rfl: 4   losartan -hydrochlorothiazide (HYZAAR) 100-25 MG tablet, Take 1 tablet by mouth every morning., Disp: 90 tablet, Rfl: 1   Multiple Vitamins-Minerals (MULTIVITAMIN WITH MINERALS) tablet, Take 1 tablet by mouth daily., Disp: , Rfl:    potassium chloride  SA (KLOR-CON  M) 20 MEQ tablet, Take 20 mEq by mouth every morning., Disp: , Rfl:    simvastatin (ZOCOR) 40 MG tablet, Take 40 mg by mouth every morning., Disp: , Rfl:        Objective:     BP (!) 160/65 (BP Location: Right Arm, Patient Position: Sitting, Cuff Size: Normal)   Pulse 77   Ht 4' 11 (1.499 m)   Wt 144 lb 3.2 oz (65.4 kg)   SpO2 97%   BMI 29.12 kg/m   Wt Readings from Last 3 Encounters:  11/12/23 144 lb 3.2 oz (65.4 kg)  10/24/23 144 lb (65.3 kg)  04/24/23 136 lb 3.2 oz (61.8 kg)    Physical Exam  Physical Exam Vitals reviewed.  Constitutional:      Appearance: Normal appearance. Well-developed with normal weight.  Cardiovascular:     Rate and Rhythm: Normal rate and regular rhythm. Normal heart sounds. Normal peripheral pulses Pulmonary:     Normal breath sounds with normal effort Skin:    General: Skin is warm and dry  without noticeable rash. Neurological:     General: No focal deficit present.  Psychiatric:        Mood and Affect: Mood, behavior and cognition normal      Assessment & Plan:  No diagnosis found.  No orders of the defined types were placed in this encounter.    Assessment and Plan     #1 hypertension.  Continue with potassium supplementation and losartan  hydrochlorothiazide 100/25 mg tablets.  Continue to monitor blood pressure at home.  She is chronically elevated in the office.    #2 hyperlipidemia patient to continue with simvastatin 40 mg tablets.   Will have this patient follow-up in January or as needed otherwise. She did receive influenza vaccine

## 2023-11-14 ENCOUNTER — Ambulatory Visit

## 2023-12-07 ENCOUNTER — Inpatient Hospital Stay: Attending: Internal Medicine

## 2023-12-07 DIAGNOSIS — C884 Extranodal marginal zone b-cell lymphoma of mucosa-associated lymphoid tissue (malt-lymphoma) not having achieved remission: Secondary | ICD-10-CM | POA: Diagnosis not present

## 2023-12-07 DIAGNOSIS — Z452 Encounter for adjustment and management of vascular access device: Secondary | ICD-10-CM | POA: Diagnosis not present

## 2024-01-22 ENCOUNTER — Telehealth: Payer: Self-pay

## 2024-01-22 NOTE — Telephone Encounter (Signed)
 Unable to pend

## 2024-01-22 NOTE — Telephone Encounter (Unsigned)
 Copied from CRM #[id number]. Topic: Clinical - Medication Refill >> Jan 22, 2024  9:40 AM Dedra B wrote: Medication: Rosuvastatin 10 mg 1 tablet every day (not on current med list)  Has the patient contacted their pharmacy? No refills   This is the patient's preferred pharmacy:  [id number] - [place], [place] - [address] AT [facility] [address] [place] [address] Phone: [phone] Fax: [phone]  Is this the correct pharmacy for this prescription? Yes  Has the prescription been filled recently? No  Is the patient out of the medication? Yes  Has the patient been seen for an appointment in the last year OR does the patient have an upcoming appointment? Yes  Can we respond through MyChart? No  Agent: Please be advised that Rx refills may take up to 3 business days. We ask that you follow-up with your pharmacy.

## 2024-01-24 ENCOUNTER — Ambulatory Visit: Payer: Self-pay

## 2024-01-24 ENCOUNTER — Other Ambulatory Visit: Payer: Self-pay

## 2024-01-24 MED ORDER — SIMVASTATIN 40 MG PO TABS: 40.0000 mg | ORAL_TABLET | ORAL | 3 refills | Status: DC

## 2024-01-24 NOTE — Telephone Encounter (Signed)
 FYI Only or Action Required?: Action required by provider: patient requesting rosuvastatin 10mg  which is not on her current med list.  Patient was last seen in primary care on 11/12/2023 by Zafirov, Clarissa A, MD.  Called Nurse Triage reporting Medication Problem.  Patient/caregiver understands and will follow disposition?: Yes      Copied from CRM #[id number]. Topic: Clinical - Prescription Issue >> Jan 24, 2024  1:18 PM Cleave MATSU wrote: Reason for CRM: pt stated that pharmacy faxed over request for rosuvastatin to be refilled but they havent sent it in yet. Pt insisted on speaking with nurse. Answer Assessment - Initial Assessment Questions Last Office Visit with Dr Parris Zafirov---11/12/2023  10mg  Rosuvastatin  Patient states she has been out of this medication for four days  Spoke with Vernell at [facility] about this situation, Shameeka---this medication is not on the current medication list--patient states she gave a list of all her medications to Dr Alec office and this one is out now with no refills. Medication: Rosuvastatin 10 mg 1 tablet every day (not on current med list)   Has the patient contacted their pharmacy? No refills     This is the patient's preferred pharmacy:  [id number] - [place], [place] - [address] AT [facility] OF [address] [facility] [address] [place] [address] Phone: [phone] Fax: [phone]    Patient wants to know why this one hasn't been refilled yet---Pharmacy told her that they sent it already to the office three times. Patient is advised to call us  back if anything changes or with any further questions/concerns. Patient verbalized understanding.  Protocols used: Medication Question Call-A-AH

## 2024-01-25 ENCOUNTER — Other Ambulatory Visit: Payer: Self-pay

## 2024-01-25 ENCOUNTER — Telehealth: Payer: Self-pay

## 2024-01-25 MED ORDER — SIMVASTATIN 40 MG PO TABS: 40.0000 mg | ORAL_TABLET | ORAL | 3 refills | Status: AC

## 2024-01-25 NOTE — Telephone Encounter (Signed)
 Copied from CRM #[id number]. Topic: Clinical - Prescription Issue >> Jan 25, 2024 11:30 AM Vena HERO wrote: Reason for CRM: I called [facility] but after I got back on the phone with the pt she realized that the rosuvastatin may be an old medication and stated that her simvastatin is already at the pharmacy ready for pick up. Now she is confused and doesn't know which one she is supposed to be taking. Please call pt to clarify, thank you

## 2024-02-18 ENCOUNTER — Ambulatory Visit

## 2024-02-18 VITALS — BP 140/76 | HR 77 | Ht 59.0 in | Wt 140.6 lb

## 2024-02-18 DIAGNOSIS — J479 Bronchiectasis, uncomplicated: Secondary | ICD-10-CM | POA: Diagnosis not present

## 2024-02-18 DIAGNOSIS — I10 Essential (primary) hypertension: Secondary | ICD-10-CM | POA: Diagnosis not present

## 2024-02-18 DIAGNOSIS — Z853 Personal history of malignant neoplasm of breast: Secondary | ICD-10-CM | POA: Insufficient documentation

## 2024-02-18 DIAGNOSIS — E876 Hypokalemia: Secondary | ICD-10-CM | POA: Diagnosis not present

## 2024-02-18 LAB — CBC WITH DIFFERENTIAL/PLATELET
Absolute Lymphocytes: 2294 {cells}/uL (ref 850–3900)
Absolute Monocytes: 826 {cells}/uL (ref 200–950)
Basophils Absolute: 10 {cells}/uL (ref 0–200)
Basophils Relative: 0.2 %
Eosinophils Absolute: 29 {cells}/uL (ref 15–500)
Eosinophils Relative: 0.6 %
HCT: 37.9 % (ref 35.9–46.0)
Hemoglobin: 12.3 g/dL (ref 11.7–15.5)
MCH: 30.2 pg (ref 27.0–33.0)
MCHC: 32.5 g/dL (ref 31.6–35.4)
MCV: 93.1 fL (ref 81.4–101.7)
MPV: 11.4 fL (ref 7.5–12.5)
Monocytes Relative: 17.2 %
Neutro Abs: 1642 {cells}/uL (ref 1500–7800)
Neutrophils Relative %: 34.2 %
Platelets: 240 Thousand/uL (ref 140–400)
RBC: 4.07 Million/uL (ref 3.80–5.10)
RDW: 12.5 % (ref 11.0–15.0)
Total Lymphocyte: 47.8 %
WBC: 4.8 Thousand/uL (ref 3.8–10.8)

## 2024-02-18 NOTE — Progress Notes (Signed)
 "           Progress Note  Physician: Livingston Denner A Aly Seidenberg, MD   HPI: Andrea Rodgers is a 84 y.o. female presenting on 02/18/2024 for Follow-up .  Discussed the use of AI scribe software for clinical note transcription with the patient, who gave verbal consent to proceed.  History of Present Illness    Patient seen in follow-up.  She has a history of hypertension and shin for which she continues with losartan  hydrochlorothiazide 100/25 mg blood pressure initially in office 164/78 and on repeat 140/76.  She has not been checking her blood pressure at home.  Patient is supplementing potassium 20 mill equivalents daily.  She is due for lab repeat.  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        Medical history:  Relevant past medical, surgical, family and social history reviewed and updated as indicated. Interim medical history since our last visit reviewed.  Allergies and medications reviewed and updated.   ROS: Negative unless specifically indicated above in HPI.   Current Medications[1]       Objective:     BP (!) 164/78 (BP Location: Right Arm, Patient Position: Sitting, Cuff Size: Normal)   Pulse 77   Ht 4' 11 (1.499 m)   Wt 140 lb 9.6 oz (63.8 kg)   SpO2 96%   BMI 28.40 kg/m   Wt Readings from Last 3 Encounters:  02/18/24 140 lb 9.6 oz (63.8 kg)  11/12/23 144 lb 3.2 oz (65.4 kg)  10/24/23 144 lb (65.3 kg)    Physical Exam  Physical Exam Vitals reviewed.  Constitutional:      Appearance: Normal appearance. Well-developed with normal weight.  Cardiovascular:     Rate and Rhythm: Normal rate and regular rhythm. Normal heart sounds. Normal peripheral pulses Pulmonary:      Normal breath sounds with normal effort Skin:    General: Skin is warm and dry without noticeable rash. Neurological:     General: No focal deficit present.  Psychiatric:        Mood and Affect: Mood, behavior and cognition normal      Assessment & Plan:   Encounter Diagnoses  Name Primary?   Hypertension, unspecified type     No orders of the defined types were placed in this encounter.    Assessment and Plan     #1 hypertension.  Patient to monitor blood pressure at home.  Suspect she is closer to her normal range as her blood pressure improved on repeat check.  She should continue with amlodipine  2.5 mg tablets and losartan  hydrochlorothiazide 100/25 mg tablets  2.  Hypokalemia.  Will have her recheck labs today since she is supplementing potassium.  Follow-up with patient depending on lab result.    Will follow-up on results of bone density scan.  Last DEXA scan from 2022 showing T-score of -1.2.    Patient continues with annual mammogram screening and oncology visits.  She remains on anastrozole  1 mg daily given history of MALT lymphoma and breast cancer            [1]  Current Outpatient Medications:    alendronate (FOSAMAX) 70 MG tablet, Take 70 mg by mouth once a  week. Take with a full glass of water on an empty stomach.  Wednesdays, Disp: , Rfl:    amLODipine  (NORVASC ) 2.5 MG tablet, Take 1 tablet (2.5 mg total) by mouth every morning., Disp: 90 tablet, Rfl: 2   anastrozole  (ARIMIDEX ) 1 MG tablet, TAKE 1 TABLET BY MOUTH EVERY DAY, Disp: 90 tablet, Rfl: 3   aspirin 81 MG tablet, Take 81 mg by mouth daily., Disp: , Rfl:    cholecalciferol (VITAMIN D ) 1000 UNITS tablet, Take 1,000 Units by mouth daily., Disp: , Rfl:    losartan -hydrochlorothiazide (HYZAAR) 100-25 MG tablet, Take 1 tablet by mouth every morning., Disp: 90 tablet, Rfl: 1   Multiple Vitamins-Minerals (MULTIVITAMIN WITH MINERALS) tablet, Take 1 tablet by mouth daily., Disp: , Rfl:    potassium  chloride SA (KLOR-CON  M) 20 MEQ tablet, Take 1 tablet (20 mEq total) by mouth every morning., Disp: 90 tablet, Rfl: 3   simvastatin  (ZOCOR ) 40 MG tablet, Take 1 tablet (40 mg total) by mouth every morning., Disp: 90 tablet, Rfl: 3   lidocaine -prilocaine  (EMLA ) cream, Apply 1 application. topically as needed. Apply to port and cover with saran wrap 1-2 hours prior to port access (Patient not taking: Reported on 02/18/2024), Disp: 30 g, Rfl: 4  "

## 2024-02-19 ENCOUNTER — Ambulatory Visit: Payer: Self-pay

## 2024-02-19 ENCOUNTER — Other Ambulatory Visit: Payer: Self-pay

## 2024-02-19 ENCOUNTER — Encounter: Payer: Self-pay | Admitting: Internal Medicine

## 2024-02-19 LAB — COMPREHENSIVE METABOLIC PANEL WITH GFR
AG Ratio: 1.8 (calc) (ref 1.0–2.5)
ALT: 18 U/L (ref 6–29)
AST: 18 U/L (ref 10–35)
Albumin: 4.4 g/dL (ref 3.6–5.1)
Alkaline phosphatase (APISO): 73 U/L (ref 37–153)
BUN: 12 mg/dL (ref 7–25)
CO2: 32 mmol/L (ref 20–32)
Calcium: 9.6 mg/dL (ref 8.6–10.4)
Chloride: 103 mmol/L (ref 98–110)
Creat: 0.61 mg/dL (ref 0.60–0.95)
Globulin: 2.5 g/dL (ref 1.9–3.7)
Glucose, Bld: 93 mg/dL (ref 65–139)
Potassium: 4.9 mmol/L (ref 3.5–5.3)
Sodium: 144 mmol/L (ref 135–146)
Total Bilirubin: 0.4 mg/dL (ref 0.2–1.2)
Total Protein: 6.9 g/dL (ref 6.1–8.1)
eGFR: 88 mL/min/1.73m2

## 2024-02-27 ENCOUNTER — Other Ambulatory Visit: Payer: Self-pay

## 2024-02-27 DIAGNOSIS — Z1231 Encounter for screening mammogram for malignant neoplasm of breast: Secondary | ICD-10-CM

## 2024-02-29 NOTE — Progress Notes (Signed)
 Andrea Rodgers                                          MRN: [id number]   02/29/2024   The [facility] Quality Team Specialist reviewed this patient medical record for the purposes of chart review for care gap closure. The following were reviewed: chart review for care gap closure-controlling blood pressure.    [facility] Quality Team

## 2024-03-03 ENCOUNTER — Other Ambulatory Visit: Payer: Self-pay

## 2024-03-03 DIAGNOSIS — I10 Essential (primary) hypertension: Secondary | ICD-10-CM

## 2024-03-17 ENCOUNTER — Other Ambulatory Visit

## 2024-03-17 ENCOUNTER — Ambulatory Visit: Admitting: Internal Medicine

## 2024-03-20 ENCOUNTER — Ambulatory Visit: Admission: RE | Admit: 2024-03-20 | Discharge: 2024-03-20 | Disposition: A | Source: Ambulatory Visit

## 2024-03-20 ENCOUNTER — Ambulatory Visit: Payer: Self-pay | Admitting: Internal Medicine

## 2024-03-20 DIAGNOSIS — Z1231 Encounter for screening mammogram for malignant neoplasm of breast: Secondary | ICD-10-CM

## 2024-03-20 DIAGNOSIS — M810 Age-related osteoporosis without current pathological fracture: Secondary | ICD-10-CM

## 2024-03-27 ENCOUNTER — Inpatient Hospital Stay: Admitting: Internal Medicine

## 2024-03-27 ENCOUNTER — Inpatient Hospital Stay

## 2024-06-17 ENCOUNTER — Ambulatory Visit
